# Patient Record
Sex: Male | Born: 1945 | Race: White | Hispanic: No | Marital: Married | Smoking: Former smoker
Health system: Southern US, Community
[De-identification: ages and names within clinical notes are randomized; demographics above are authoritative.]

## PROBLEM LIST (undated history)

## (undated) DIAGNOSIS — E785 Hyperlipidemia, unspecified: Secondary | ICD-10-CM

## (undated) DIAGNOSIS — E119 Type 2 diabetes mellitus without complications: Secondary | ICD-10-CM

## (undated) DIAGNOSIS — M4802 Spinal stenosis, cervical region: Secondary | ICD-10-CM

## (undated) DIAGNOSIS — Z973 Presence of spectacles and contact lenses: Secondary | ICD-10-CM

## (undated) DIAGNOSIS — K219 Gastro-esophageal reflux disease without esophagitis: Secondary | ICD-10-CM

## (undated) DIAGNOSIS — I10 Essential (primary) hypertension: Secondary | ICD-10-CM

## (undated) DIAGNOSIS — I739 Peripheral vascular disease, unspecified: Secondary | ICD-10-CM

## (undated) DIAGNOSIS — C801 Malignant (primary) neoplasm, unspecified: Secondary | ICD-10-CM

## (undated) DIAGNOSIS — H409 Unspecified glaucoma: Secondary | ICD-10-CM

## (undated) HISTORY — DX: Essential (primary) hypertension: I10

## (undated) HISTORY — DX: Unspecified glaucoma: H40.9

## (undated) HISTORY — PX: HERNIA REPAIR: SHX51

## (undated) HISTORY — DX: Gastro-esophageal reflux disease without esophagitis: K21.9

## (undated) HISTORY — DX: Hyperlipidemia, unspecified: E78.5

## (undated) HISTORY — DX: Peripheral vascular disease, unspecified: I73.9

## (undated) HISTORY — DX: Type 2 diabetes mellitus without complications: E11.9

## (undated) HISTORY — PX: OTHER SURGICAL HISTORY: SHX169

---

## 2000-06-03 ENCOUNTER — Emergency Department (HOSPITAL_COMMUNITY): Admission: EM | Admit: 2000-06-03 | Discharge: 2000-06-03 | Payer: Self-pay | Admitting: *Deleted

## 2000-06-03 ENCOUNTER — Encounter: Payer: Self-pay | Admitting: *Deleted

## 2009-11-04 ENCOUNTER — Ambulatory Visit (HOSPITAL_COMMUNITY): Admission: RE | Admit: 2009-11-04 | Discharge: 2009-11-04 | Payer: Self-pay | Admitting: General Surgery

## 2010-04-02 LAB — CBC
HCT: 46 % (ref 39.0–52.0)
Hemoglobin: 15.8 g/dL (ref 13.0–17.0)
MCH: 30.4 pg (ref 26.0–34.0)
MCHC: 34.4 g/dL (ref 30.0–36.0)
MCV: 88.3 fL (ref 78.0–100.0)
Platelets: 196 10*3/uL (ref 150–400)
RBC: 5.21 MIL/uL (ref 4.22–5.81)
RDW: 12.8 % (ref 11.5–15.5)
WBC: 6.7 10*3/uL (ref 4.0–10.5)

## 2010-04-02 LAB — BASIC METABOLIC PANEL
BUN: 17 mg/dL (ref 6–23)
CO2: 28 mEq/L (ref 19–32)
Calcium: 9.4 mg/dL (ref 8.4–10.5)
Chloride: 105 mEq/L (ref 96–112)
Creatinine, Ser: 0.94 mg/dL (ref 0.4–1.5)
GFR calc Af Amer: >60 mL/min (ref >60)
GFR calc non Af Amer: >60 mL/min (ref >60)
Glucose, Bld: 113 mg/dL — ABNORMAL HIGH (ref 70–99)
Potassium: 4.2 mEq/L (ref 3.5–5.1)
Sodium: 139 mEq/L (ref 135–145)

## 2010-04-02 LAB — SURGICAL PCR SCREEN
MRSA, PCR: NEGATIVE
Staphylococcus aureus: NEGATIVE

## 2010-11-19 ENCOUNTER — Ambulatory Visit (HOSPITAL_COMMUNITY): Payer: Self-pay

## 2010-11-19 ENCOUNTER — Other Ambulatory Visit: Payer: Self-pay | Admitting: Family Medicine

## 2010-11-19 DIAGNOSIS — J40 Bronchitis, not specified as acute or chronic: Secondary | ICD-10-CM

## 2010-11-25 ENCOUNTER — Ambulatory Visit (HOSPITAL_COMMUNITY)
Admission: RE | Admit: 2010-11-25 | Discharge: 2010-11-25 | Disposition: A | Discharge status: Home / Self Care | Payer: Medicare Other | Source: Ambulatory Visit | Attending: Family Medicine | Admitting: Family Medicine

## 2010-11-25 DIAGNOSIS — R059 Cough, unspecified: Secondary | ICD-10-CM | POA: Insufficient documentation

## 2010-11-25 DIAGNOSIS — R05 Cough: Secondary | ICD-10-CM | POA: Insufficient documentation

## 2010-11-25 DIAGNOSIS — J40 Bronchitis, not specified as acute or chronic: Secondary | ICD-10-CM | POA: Insufficient documentation

## 2011-09-15 DIAGNOSIS — H43819 Vitreous degeneration, unspecified eye: Secondary | ICD-10-CM | POA: Diagnosis not present

## 2011-09-15 DIAGNOSIS — H2510 Age-related nuclear cataract, unspecified eye: Secondary | ICD-10-CM | POA: Diagnosis not present

## 2012-01-29 DIAGNOSIS — J019 Acute sinusitis, unspecified: Secondary | ICD-10-CM | POA: Diagnosis not present

## 2012-02-24 ENCOUNTER — Emergency Department (HOSPITAL_COMMUNITY): Payer: Medicare Other

## 2012-02-24 ENCOUNTER — Encounter (HOSPITAL_COMMUNITY): Payer: Self-pay

## 2012-02-24 ENCOUNTER — Emergency Department (HOSPITAL_COMMUNITY)
Admission: EM | Admit: 2012-02-24 | Discharge: 2012-02-24 | Disposition: A | Discharge status: Home / Self Care | Payer: Medicare Other | Attending: Emergency Medicine | Admitting: Emergency Medicine

## 2012-02-24 DIAGNOSIS — S92919A Unspecified fracture of unspecified toe(s), initial encounter for closed fracture: Secondary | ICD-10-CM | POA: Diagnosis not present

## 2012-02-24 DIAGNOSIS — X500XXA Overexertion from strenuous movement or load, initial encounter: Secondary | ICD-10-CM | POA: Insufficient documentation

## 2012-02-24 DIAGNOSIS — Y939 Activity, unspecified: Secondary | ICD-10-CM | POA: Insufficient documentation

## 2012-02-24 DIAGNOSIS — F17200 Nicotine dependence, unspecified, uncomplicated: Secondary | ICD-10-CM | POA: Insufficient documentation

## 2012-02-24 DIAGNOSIS — Y92009 Unspecified place in unspecified non-institutional (private) residence as the place of occurrence of the external cause: Secondary | ICD-10-CM | POA: Insufficient documentation

## 2012-02-24 DIAGNOSIS — W010XXA Fall on same level from slipping, tripping and stumbling without subsequent striking against object, initial encounter: Secondary | ICD-10-CM | POA: Insufficient documentation

## 2012-02-24 DIAGNOSIS — IMO0002 Reserved for concepts with insufficient information to code with codable children: Secondary | ICD-10-CM | POA: Diagnosis not present

## 2012-02-24 DIAGNOSIS — S93106A Unspecified dislocation of unspecified toe(s), initial encounter: Secondary | ICD-10-CM

## 2012-02-24 DIAGNOSIS — S92403A Displaced unspecified fracture of unspecified great toe, initial encounter for closed fracture: Secondary | ICD-10-CM

## 2012-02-24 DIAGNOSIS — S92911A Unspecified fracture of right toe(s), initial encounter for closed fracture: Secondary | ICD-10-CM

## 2012-02-24 IMAGING — CR DG TOE GREAT 2+V*R*
3 series · 3 of 3 positions shown · non-contrast
Comparison: [DATE] at [DATE] hours

CLINICAL DATA: External reduction.  Great toe fracture.

RIGHT GREAT TOE

[view not recorded (1 of 3)]
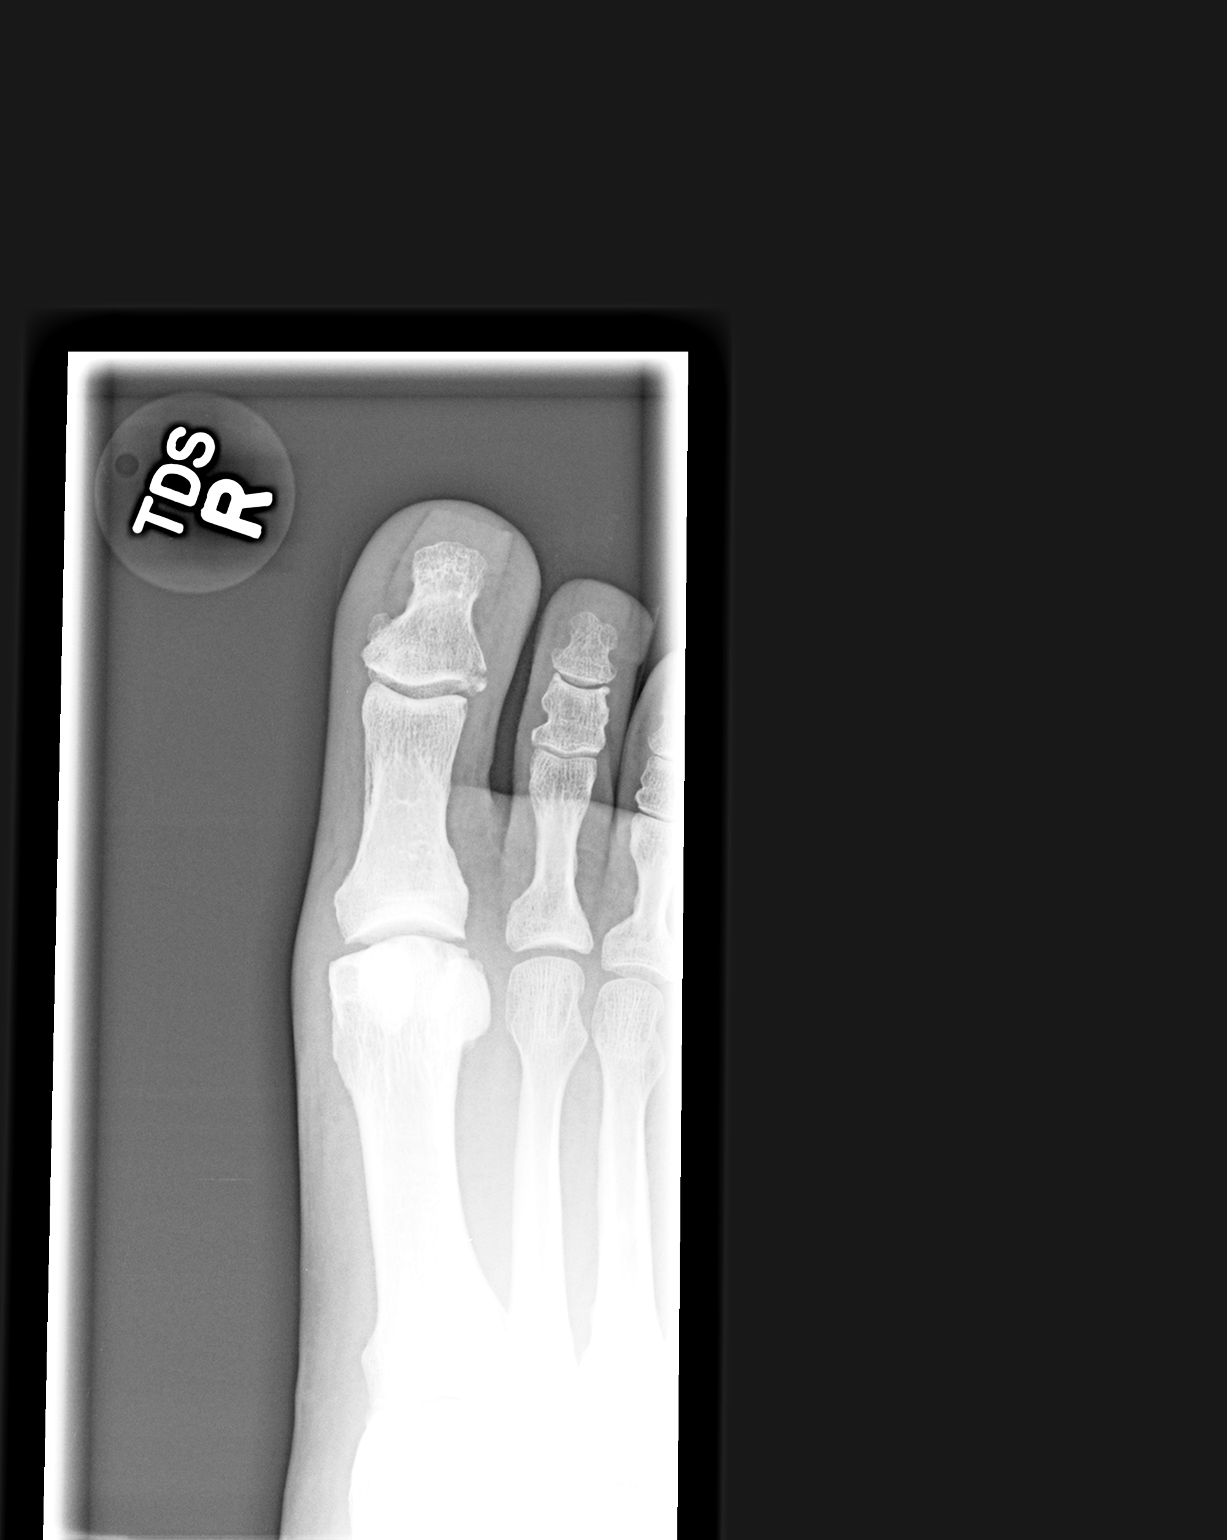

[view not recorded (2 of 3)]
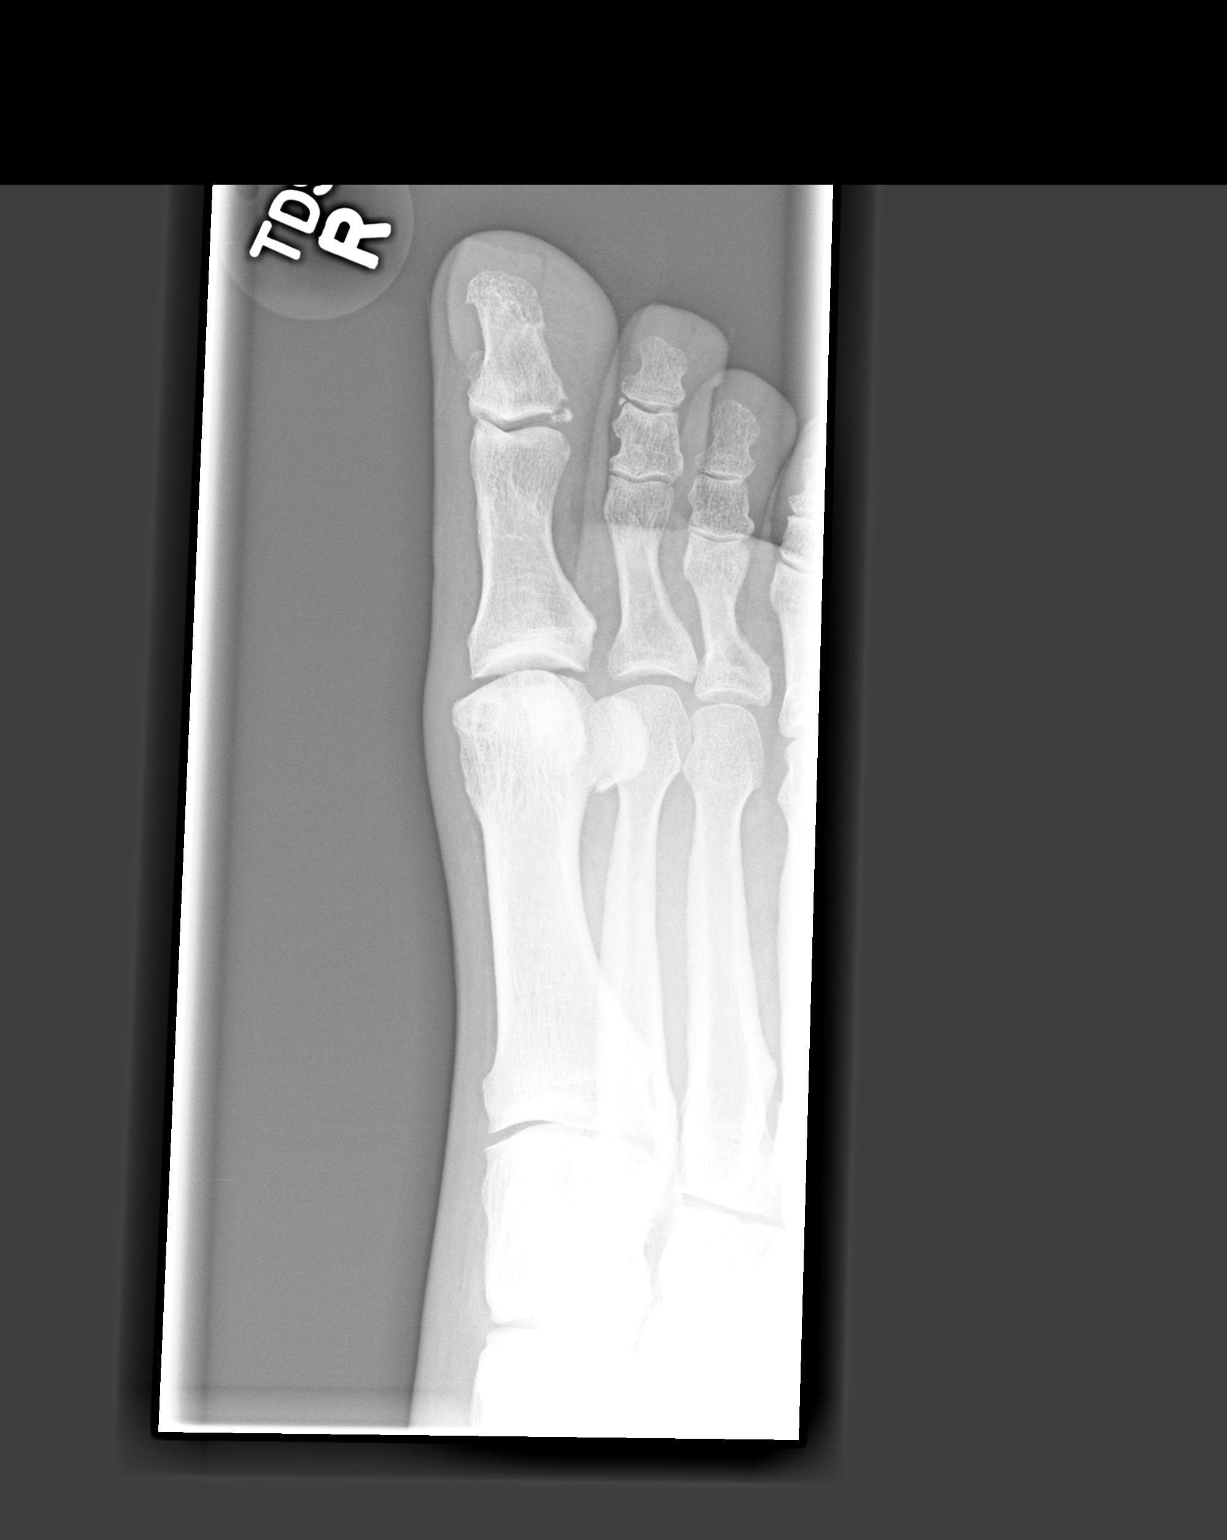

[view not recorded (3 of 3)]
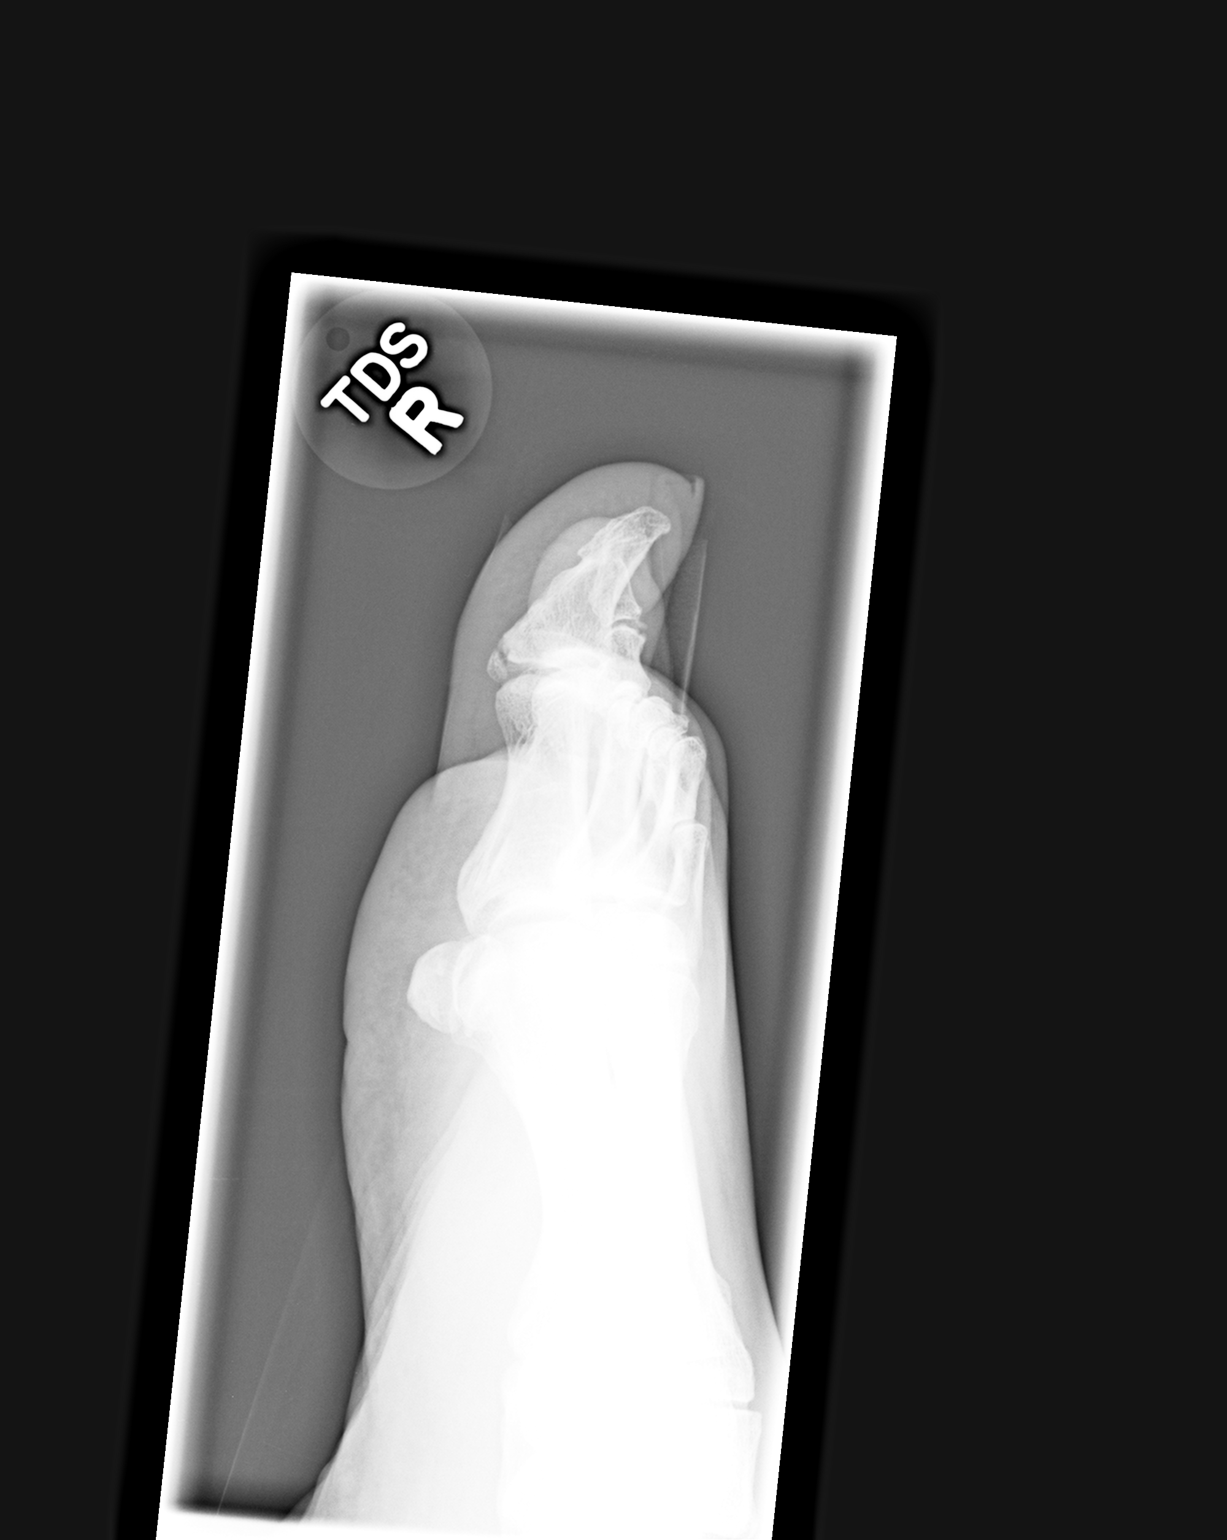

[3 of 3 positions shown; findings below may reference images not displayed]

FINDINGS: Prior dislocation is been successfully reduced.  Lateral
fracture of the distal phalangeal base is again observed, now with
reduced displacement of bowel 1 mm.
IMPRESSION: 1.  Successful reduction of distal interphalangeal joint.  Lateral
basilar fracture of the distal phalanx noted.

## 2012-02-24 MED ORDER — LIDOCAINE HCL (PF) 1 % IJ SOLN
5.0000 mL | Freq: Once | INTRAMUSCULAR | Status: AC
  Administered 2012-02-24: 5 mL
  Filled 2012-02-24: qty 5

## 2012-02-24 MED ORDER — IBUPROFEN 800 MG PO TABS
800.0000 mg | ORAL_TABLET | Freq: Once | ORAL | Status: AC
  Administered 2012-02-24: 800 mg via ORAL
  Filled 2012-02-24: qty 1

## 2012-02-24 MED ORDER — HYDROCODONE-ACETAMINOPHEN 5-325 MG PO TABS: ORAL_TABLET | ORAL | Status: DC

## 2012-02-24 NOTE — ED Provider Notes (Signed)
Pt has a dislocation of the IP joint of the right great toe, PA Triplett was having difficulty getting it relocated. I assisted and we did extension with lateral pressure and a pop was felt and the toe now is no longer deformed and has good ROM at the IP joint.   Medical screening examination/treatment/procedure(s) were conducted as a shared visit with non-physician practitioner(s) and myself.  I personally evaluated the patient during the encounter  Devoria Albe, MD, Franz Dell, MD 02/24/12 1344

## 2012-02-24 NOTE — ED Notes (Signed)
Pt did not want post op shoe

## 2012-02-24 NOTE — ED Notes (Signed)
Wound cleaned with sterile water,telfa applied and gauze wrap. Pt tolerated well.

## 2012-02-24 NOTE — ED Provider Notes (Addendum)
History     CSN: [id number]  Arrival date & time 02/24/12  1026   First MD Initiated Contact with Patient 02/24/12 1104      Chief Complaint  Patient presents with  . Fall    (Consider location/radiation/quality/duration/timing/severity/associated sxs/prior treatment) HPI Comments: Patient c/o throbbing pain to the right great toe that began after he slipped and fell outside on the evening prior to ED arrival.  States that he fell on his left knee and his right foot was twisted underneath his leg.  C/o abrasion to the left knee and increasing pain to the toe.  Noticed swelling and bruising to the toe.  Able to bear weight to the heel of his foot only.  He denies head injury, neck pain, back pain or other injuries.  He also denies having any symptoms prior to the fall.    Patient is a 67 y.o. male presenting with foot injury. The history is provided by the patient and the spouse.  Foot Injury  Incident onset: greater than 12 hrs PTA. The incident occurred at home. The injury mechanism was a fall. The pain is present in the right toes. The quality of the pain is described as aching and throbbing. The pain is moderate. The pain has been constant since onset. Pertinent negatives include no numbness, no inability to bear weight, no loss of motion, no muscle weakness, no loss of sensation and no tingling. He reports no foreign bodies present. The symptoms are aggravated by activity, bearing weight and palpation. He has tried elevation for the symptoms. The treatment provided no relief.    History reviewed. No pertinent past medical history.  Past Surgical History  Procedure Date  . Hernia repair     No family history on file.  History  Substance Use Topics  . Smoking status: Current Every Day Smoker    Types: Cigarettes  . Smokeless tobacco: Not on file  . Alcohol Use: Yes     Comment: occ      Review of Systems  Constitutional: Negative for fever and chills.  HENT: Negative for  neck pain.   Eyes: Negative for visual disturbance.  Genitourinary: Negative for dysuria and difficulty urinating.  Musculoskeletal: Positive for joint swelling and arthralgias. Negative for back pain.  Skin: Negative for color change and wound.  Neurological: Negative for dizziness, tingling and numbness.  All other systems reviewed and are negative.    Allergies  Review of patient's allergies indicates no known allergies.  Home Medications   Current Outpatient Rx  Name  Route  Sig  Dispense  Refill  . ASPIRIN 325 MG PO TABS   Oral   Take 325 mg by mouth daily as needed. For pain           BP 151/72  Pulse 70  Temp 97.9 F (36.6 C) (Oral)  Resp 22  Ht 5\' 11"  (1.803 m)  Wt 195 lb (88.451 kg)  BMI 27.20 kg/m2  SpO2 98%  Physical Exam  Nursing note and vitals reviewed. Constitutional: He is oriented to person, place, and time. He appears well-developed and well-nourished. No distress.  HENT:  Head: Normocephalic and atraumatic.  Neck: Normal range of motion, full passive range of motion without pain and phonation normal. Neck supple. No spinous process tenderness and no muscular tenderness present.  Cardiovascular: Normal rate, regular rhythm, normal heart sounds and intact distal pulses.   No murmur heard. Pulmonary/Chest: Effort normal and breath sounds normal. No respiratory distress.  Musculoskeletal: He  exhibits edema and tenderness.       Right foot: He exhibits tenderness, bony tenderness, swelling and deformity. He exhibits normal range of motion, normal capillary refill, no crepitus and no laceration.       Feet:       ttp of the distal right great toe with a medial deformity of the distal toe.  Nail is intact w/o injury.  DP pulse is brisk, CR< 2 sec, distal sensation intact  Lymphadenopathy:    He has no cervical adenopathy.  Neurological: He is alert and oriented to person, place, and time. He exhibits normal muscle tone. Coordination normal.  Skin: Skin  is warm and dry.    ED Course  Reduction of dislocation Performed by: Jacquelyn Shadrick L. Authorized by: Maxwell Caul Consent: Verbal consent obtained. Written consent not obtained. Risks and benefits: risks, benefits and alternatives were discussed Consent given by: patient Patient understanding: patient states understanding of the procedure being performed Patient consent: the patient's understanding of the procedure matches consent given Imaging studies: imaging studies available Patient identity confirmed: verbally with patient and arm band Time out: Immediately prior to procedure a "time out" was called to verify the correct patient, procedure, equipment, support staff and site/side marked as required. Preparation: Patient was prepped and draped in the usual sterile fashion. Local anesthesia used: yes Anesthesia: digital block Local anesthetic: lidocaine 1% without epinephrine Anesthetic total: 4 ml Patient sedated: no Patient tolerance: Patient tolerated the procedure well with no immediate complications.   (including critical care time)  Labs Reviewed - No data to display Dg Foot Complete Right  02/24/2012  *RADIOLOGY REPORT*  Clinical Data: Right foot pain after a fall.  RIGHT FOOT COMPLETE - 3+ VIEW  Comparison: None.  Findings: There is an acute oblique fracture of the lateral base of the distal phalanx of the great toe extending into the interphalangeal joint articular surface.  Distal phalanx appears medially displaced 8 mm with respect to the proximal phalanx of the great toe.  No malalignment at the Lisfranc joint observed.  No metatarsal fracture noted.  A degenerative subcortical cyst or erosion is noted medially along the first metatarsal head.  IMPRESSION:  1.  Fracture the lateral base of the distal phalanx of the great toe, with medial dislocation of the distal phalanx with respect the proximal.   Original Report Authenticated By: Gaylyn Rong, M.D.    Dg Toe  Great Right  02/24/2012  *RADIOLOGY REPORT*  Clinical Data: External reduction.  Great toe fracture.  RIGHT GREAT TOE  Comparison: 02/24/2012 at 13:02 hours  Findings: Prior dislocation is been successfully reduced.  Lateral fracture of the distal phalangeal base is again observed, now with reduced displacement of bowel 1 mm.  IMPRESSION:  1.  Successful reduction of distal interphalangeal joint.  Lateral basilar fracture of the distal phalanx noted.   Original Report Authenticated By: Gaylyn Rong, M.D.    Dg Toe Great Right  02/24/2012  *RADIOLOGY REPORT*  Clinical Data: Status post reduction.  Distal phalanx fracture.  RIGHT GREAT TOE  Comparison: Three views the right foot 02/24/2012.  Findings: A fracture at the lateral aspect of the base of the distal phalanx is similar to the prior study.  The distal phalanx remains subluxed medially.  No new fractures are present.  IMPRESSION:  1.  Persistence medial subluxation of the distal phalanx in the great toe. 2.  Stable appearance of the fracture at the lateral base of the distal phalanx.   Original  Report Authenticated By: Marin Roberts, M.D.      Diagnosis:  Dislocation and fracture of the distal right interphalangeal joint of the great toe   MDM    Reduction of the dislocated phalynx attempted by myself.  Unsuccessful by x-ray    1340  Successful second attempt at reduction of the dislocation by myself and Dr. Devoria Albe.  Pain improved.  Distal sensation remains intact  Toe was buddy taped by me and post-op boot applied by nursing.  Pt agrees to elevate, ice and f/u with orthopedics.     Prescribed: norco #20  Jhade Berko L. Brantley Wiley, PA 02/25/12 2229  Oscar Forman L. [place], [place] 03/10/12 [phone]

## 2012-02-24 NOTE — ED Notes (Signed)
Pt reports that he lost balance and fell last night, injured right knee and foot. Woke today w/ cont. pain

## 2012-02-26 NOTE — ED Provider Notes (Signed)
Medical screening examination/treatment/procedure(s) were performed by non-physician practitioner and as supervising physician I was immediately available for consultation/collaboration. Aadvika Konen, MD, FACEP   Taleia Sadowski L Matteson Blue, MD 02/26/12 0030 

## 2012-03-10 NOTE — ED Provider Notes (Signed)
Medical screening examination/treatment/procedure(s) were performed by non-physician practitioner and as supervising physician I was immediately available for consultation/collaboration. Alvar Malinoski, MD, FACEP   Shaianne Nucci L Tywana Robotham, MD 03/10/12 1454 

## 2012-03-16 DIAGNOSIS — R21 Rash and other nonspecific skin eruption: Secondary | ICD-10-CM | POA: Diagnosis not present

## 2012-04-08 ENCOUNTER — Encounter: Payer: Self-pay | Admitting: Family Medicine

## 2012-04-08 ENCOUNTER — Ambulatory Visit (INDEPENDENT_AMBULATORY_CARE_PROVIDER_SITE_OTHER): Payer: Medicare Other | Admitting: Family Medicine

## 2012-04-08 VITALS — BP 110/58 | HR 60 | Ht 71.0 in | Wt 194.0 lb

## 2012-04-08 DIAGNOSIS — L570 Actinic keratosis: Secondary | ICD-10-CM

## 2012-04-08 DIAGNOSIS — K219 Gastro-esophageal reflux disease without esophagitis: Secondary | ICD-10-CM

## 2012-04-08 NOTE — Progress Notes (Signed)
  Subjective:    Patient ID: Grant [name], male    DOB: July 18, 1945, 67 y.o.   MRN: [id number]  Rash This is a chronic problem. The current episode started more than 1 year ago. The problem has been gradually worsening since onset. The affected locations include the left arm. The rash is characterized by itchiness and scaling. He was exposed to nothing.      Review of Systems  Skin: Positive for rash.       Objective:   Physical Exam  Constitutional: He appears well-developed and well-nourished.  HENT:  Head: Normocephalic and atraumatic.  Eyes: EOM are normal. Pupils are equal, round, and reactive to light.  Neck: Normal range of motion. Neck supple.  Cardiovascular: Normal rate and regular rhythm.   Pulmonary/Chest: Effort normal and breath sounds normal.  Skin:  Probable actinic keratosis left elbow. Similar finding left temple.          Assessment & Plan:  Impression #1 actinic keratosis discussed. Plan patient to followup with dermatologist. Patient Active Problem List  Diagnosis  . Esophageal reflux

## 2012-04-08 NOTE — Patient Instructions (Signed)
See dr. Margo Aye for this rash.

## 2012-04-10 DIAGNOSIS — K219 Gastro-esophageal reflux disease without esophagitis: Secondary | ICD-10-CM | POA: Insufficient documentation

## 2012-04-10 NOTE — Assessment & Plan Note (Signed)
Impression reflux clinically stable. Maintain same over-the-counter approach.

## 2012-04-18 DIAGNOSIS — D0460 Carcinoma in situ of skin of unspecified upper limb, including shoulder: Secondary | ICD-10-CM | POA: Diagnosis not present

## 2012-04-18 DIAGNOSIS — L570 Actinic keratosis: Secondary | ICD-10-CM | POA: Diagnosis not present

## 2012-04-18 DIAGNOSIS — D235 Other benign neoplasm of skin of trunk: Secondary | ICD-10-CM | POA: Diagnosis not present

## 2013-08-09 ENCOUNTER — Encounter: Payer: Self-pay | Admitting: Family Medicine

## 2013-08-09 ENCOUNTER — Ambulatory Visit (INDEPENDENT_AMBULATORY_CARE_PROVIDER_SITE_OTHER): Payer: Medicare Other | Admitting: Family Medicine

## 2013-08-09 VITALS — BP 114/78 | Temp 98.0°F | Ht 71.0 in | Wt 194.2 lb

## 2013-08-09 DIAGNOSIS — M25579 Pain in unspecified ankle and joints of unspecified foot: Secondary | ICD-10-CM | POA: Diagnosis not present

## 2013-08-09 DIAGNOSIS — M25571 Pain in right ankle and joints of right foot: Secondary | ICD-10-CM

## 2013-08-09 MED ORDER — NABUMETONE 750 MG PO TABS: 750.0000 mg | ORAL_TABLET | Freq: Every day | ORAL | Status: DC

## 2013-08-09 NOTE — Progress Notes (Signed)
   Subjective:    Patient ID: Grant [name], male    DOB: 02-19-1945, 68 y.o.   MRN: [id number]  Leg Pain  The incident occurred 2 days ago. The incident occurred at home. There was no injury mechanism. The pain is present in the right leg. The quality of the pain is described as stabbing and shooting. The pain is at a severity of 9/10. The pain is moderate. The pain has been constant since onset. He reports no foreign bodies present. The symptoms are aggravated by weight bearing and movement. He has tried NSAIDs and ice for the symptoms. The treatment provided mild relief.   Patient states that he has no other concerns at this time.   Very painful the whole leg, has a tendency to " break away"  Gets painful every now and then, pain severe at times  Patient states most of the pain currently however is in his right foot. On further history had a great toe fracture and dislocation a couple years ago. Since then ongoing pain. In recent weeks right mid foot and ankle have been more painful. Swelling at times. Stays active and exercises fair amnt    Review of Systems No chest pain no headache no back pain no abdominal pain ROS otherwise negative    Objective:   Physical Exam Alert no apparent distress vitals stable H&T normal. Lungs clear heart rare rhythm. Right foot pulses good pain at the base large toe pain at right lateral ankle moderate pain with flexion of ankle moderate pain with pressure on the dorsum of foot.       Assessment & Plan:  Impression musculoskeletal pain of ankle right foot status post old great toe injury plan anti-inflammatory medicine prescribed local measures discussed. Symptomatic care discussed. WSL

## 2013-08-10 ENCOUNTER — Telehealth: Payer: Self-pay | Admitting: Family Medicine

## 2013-08-10 NOTE — Telephone Encounter (Signed)
Patient calling this morning wanting pain meds  Was seen yesterday with hip,knee pain. Wife states he really needs something for pain today.

## 2013-08-11 ENCOUNTER — Ambulatory Visit (INDEPENDENT_AMBULATORY_CARE_PROVIDER_SITE_OTHER): Payer: Medicare Other | Admitting: Family Medicine

## 2013-08-11 ENCOUNTER — Encounter: Payer: Self-pay | Admitting: Family Medicine

## 2013-08-11 ENCOUNTER — Ambulatory Visit (HOSPITAL_COMMUNITY)
Admission: RE | Admit: 2013-08-11 | Discharge: 2013-08-11 | Disposition: A | Discharge status: Home / Self Care | Payer: Medicare Other | Source: Ambulatory Visit | Attending: Family Medicine | Admitting: Family Medicine

## 2013-08-11 ENCOUNTER — Other Ambulatory Visit: Payer: Self-pay | Admitting: *Deleted

## 2013-08-11 VITALS — Ht 71.0 in | Wt 196.0 lb

## 2013-08-11 DIAGNOSIS — M25551 Pain in right hip: Secondary | ICD-10-CM

## 2013-08-11 DIAGNOSIS — M5431 Sciatica, right side: Secondary | ICD-10-CM

## 2013-08-11 DIAGNOSIS — M79604 Pain in right leg: Secondary | ICD-10-CM

## 2013-08-11 DIAGNOSIS — M5137 Other intervertebral disc degeneration, lumbosacral region: Secondary | ICD-10-CM | POA: Diagnosis not present

## 2013-08-11 DIAGNOSIS — M545 Low back pain, unspecified: Secondary | ICD-10-CM | POA: Diagnosis present

## 2013-08-11 DIAGNOSIS — M25579 Pain in unspecified ankle and joints of unspecified foot: Secondary | ICD-10-CM

## 2013-08-11 DIAGNOSIS — M51379 Other intervertebral disc degeneration, lumbosacral region without mention of lumbar back pain or lower extremity pain: Secondary | ICD-10-CM | POA: Insufficient documentation

## 2013-08-11 DIAGNOSIS — M5430 Sciatica, unspecified side: Secondary | ICD-10-CM | POA: Diagnosis not present

## 2013-08-11 DIAGNOSIS — M25571 Pain in right ankle and joints of right foot: Secondary | ICD-10-CM

## 2013-08-11 DIAGNOSIS — I700 Atherosclerosis of aorta: Secondary | ICD-10-CM | POA: Diagnosis not present

## 2013-08-11 DIAGNOSIS — M79609 Pain in unspecified limb: Secondary | ICD-10-CM | POA: Insufficient documentation

## 2013-08-11 MED ORDER — PREDNISONE 20 MG PO TABS: ORAL_TABLET | ORAL | Status: DC

## 2013-08-11 MED ORDER — HYDROCODONE-ACETAMINOPHEN 5-325 MG PO TABS: ORAL_TABLET | ORAL | Status: DC

## 2013-08-11 MED ORDER — NABUMETONE 750 MG PO TABS: 750.0000 mg | ORAL_TABLET | Freq: Two times a day (BID) | ORAL | Status: DC

## 2013-08-11 NOTE — Telephone Encounter (Signed)
STAT xrays ordered per Dr. Richardson Landry. Pt coming in today.

## 2013-08-11 NOTE — Telephone Encounter (Signed)
This is the kind of message that should have been passed thru to the only doc yesterday. We did start pt on anti inflam med. Did he get his xray??? (not in system) hydrocod 5/325 one q 4 to 6 prn, drowsy prec numb thirty re ck next wk if persists

## 2013-08-11 NOTE — Progress Notes (Signed)
   Subjective:    Patient ID: Grant [name], male    DOB: [birth year], 68 y.o.   MRN: [id number]  HPI Patient has radiating foot pain now going up thru leg.  Terrible pain, pain seemed to be in the low back and in the foot  Leg gave away and caused pin,  Started antiu inflam med  occas sacroiliac pain usually can tell when it comes on. Patient gives a long-standing history of intermittent sciatica. Recent weeks appears to be for now. Severe low back pain. Severe pain deep in the right buttock. Radiating on down the.   Also ongoing foot and ankle pain. Please see prior note. This appears to be related to prior telemetry. Patient notes that he walks somewhat differently when the toe pain flares up.  Anti-inflammatory medication related as a seemed to be helping all that much.   Review of Systems No chest pain no change in urinary bowel habits no headache no loss of consciousness no rash ROS otherwise negative    Objective:   Physical Exam  Alert no apparent distress H&T normal. Lungs clear. Heart rare rhythm. Right deep buttock tender to palpation. Positive straight leg raise on the right. Distinct tenderness right sciatic notch Pulses good. Knee good range of motion. Right lateral ankle and dorsal foot tears to deep palpation. Great toe good range of motion.  Multiple x-rays reviewed.      Assessment & Plan:  Impression 1 sciatica discussed at length. #2 foot ankle pain likely musculoskeletal secondary to adjusting gait discussed plan prednisone taper. Hydrocodone when necessary. If symptoms did not considerably improve will need a low back x-ray.

## 2013-08-11 NOTE — Addendum Note (Signed)
Addended byCharolotte Capuchin D on: 08/11/2013 08:21 AM   Modules accepted: Orders

## 2013-08-22 ENCOUNTER — Ambulatory Visit (INDEPENDENT_AMBULATORY_CARE_PROVIDER_SITE_OTHER): Payer: Medicare Other | Admitting: Family Medicine

## 2013-08-22 ENCOUNTER — Encounter: Payer: Self-pay | Admitting: Family Medicine

## 2013-08-22 ENCOUNTER — Telehealth: Payer: Self-pay | Admitting: Family Medicine

## 2013-08-22 VITALS — BP 132/90 | Ht 71.0 in | Wt 190.0 lb

## 2013-08-22 DIAGNOSIS — G589 Mononeuropathy, unspecified: Secondary | ICD-10-CM

## 2013-08-22 DIAGNOSIS — R5383 Other fatigue: Secondary | ICD-10-CM

## 2013-08-22 DIAGNOSIS — Z0189 Encounter for other specified special examinations: Secondary | ICD-10-CM | POA: Diagnosis not present

## 2013-08-22 DIAGNOSIS — R5381 Other malaise: Secondary | ICD-10-CM

## 2013-08-22 DIAGNOSIS — M25571 Pain in right ankle and joints of right foot: Secondary | ICD-10-CM

## 2013-08-22 DIAGNOSIS — M25579 Pain in unspecified ankle and joints of unspecified foot: Secondary | ICD-10-CM

## 2013-08-22 DIAGNOSIS — G629 Polyneuropathy, unspecified: Secondary | ICD-10-CM

## 2013-08-22 MED ORDER — OXYCODONE-ACETAMINOPHEN 5-325 MG PO TABS: ORAL_TABLET | ORAL | Status: DC

## 2013-08-22 NOTE — Telephone Encounter (Signed)
Office visit scheduled for today with Dr. Richardson Landry.

## 2013-08-22 NOTE — Telephone Encounter (Signed)
pts wife calling to say that his pain is some times worse than when he was in here, He can't lie down, sitting is painful. He in agony at this point. The medicine seems to  Be having ill effects on him. Can't concentrate, hard to focus. They want to bring him in Today instead of waiting for his follow up appt already scheduled for this Thursday 8/6   Please advise

## 2013-08-22 NOTE — Progress Notes (Signed)
   Subjective:    Patient ID: Grant [name], male    DOB: 11-Nov-1945, 68 y.o.   MRN: [id number]  HPIFollow up on sciatica pain on right side. Finished prednisone. Taking hydrocodone and reflafen. Pain is worse. Not able to sleep well at night.   On further history the patient reports his sciatica pain has improved considerably.  Still having bad pain focused primarily on the ankle. Worse when he gets up around. Often significant pain into the evening.  Review of Systems No chest pain no nausea no abdominal pain no change in bowel habits ROS otherwise negative    Objective:   Physical Exam  Alert mild malaise. Lungs clear heart rare rhythm H&T normal. Ankles right ankle pain to lateral palpation positive pain with flexion.  Of note x-ray was normal      Assessment & Plan:  Impression persistent ankle pain severe according to patient intermittently. Plan or so referral. Pain medicines written. At end of visit patient brought up the numbness and tingling he gets in both hands and the feet. We'll do her pre-blood work. Followup for a neuropathy evaluation. WSL

## 2013-08-23 ENCOUNTER — Other Ambulatory Visit: Payer: Self-pay | Admitting: *Deleted

## 2013-08-24 ENCOUNTER — Ambulatory Visit: Payer: Medicare Other | Admitting: Family Medicine

## 2013-08-30 ENCOUNTER — Ambulatory Visit (INDEPENDENT_AMBULATORY_CARE_PROVIDER_SITE_OTHER): Payer: Medicare Other | Admitting: Family Medicine

## 2013-08-30 ENCOUNTER — Encounter: Payer: Self-pay | Admitting: Family Medicine

## 2013-08-30 VITALS — BP 138/84 | Ht 71.0 in | Wt 190.0 lb

## 2013-08-30 DIAGNOSIS — M25579 Pain in unspecified ankle and joints of unspecified foot: Secondary | ICD-10-CM | POA: Diagnosis not present

## 2013-08-30 DIAGNOSIS — M25571 Pain in right ankle and joints of right foot: Secondary | ICD-10-CM

## 2013-08-30 MED ORDER — OXYCODONE-ACETAMINOPHEN 7.5-325 MG PO TABS: ORAL_TABLET | ORAL | Status: DC

## 2013-08-30 NOTE — Progress Notes (Signed)
   Subjective:    Patient ID: Grant [name], male    DOB: Aug 11, 1945, 68 y.o.   MRN: [id number]  Ankle Pain  The incident occurred more than 1 week ago. The incident occurred at home. There was no injury mechanism. The pain is present in the right ankle. The pain is moderate. The pain has been constant since onset. He reports no foreign bodies present. The symptoms are aggravated by weight bearing, movement and palpation. He has tried immobilization and rest (oxycodone) for the symptoms. The treatment provided no relief.   Patient returns for once again a very lengthy analysis of his leg pain. Of note did have sciatica this appears to be better. Now pain has settled primarily into right ankle. Accompanied by a very substantial swelling. Extremely painful with movement.  Taking the oxycodone very frequently.  No fam hx of gout  Prior hx of inj hyperextended his ankle stepping on something. May have experienced a trauma a few weeks ago cannot remember.  No major pain elsewhere. No headache     Review of Systems No headache no chest pain no back pain no abdominal pain no change in bowel habits no blood in stool ROS otherwise negative    Objective:   Physical Exam Alert some distress vital signs stable. Lungs clear. Heart rare in rhythm. Right ankle and foot noticeably swollen. Substantial pain with movement of right ankle. Knee good. Negative to straight leg raise.       Assessment & Plan:  Impression progressive pain now worsening call in dorsal upper mid foot. Differential includes gout s and/or posttraumatic such a stress fracture. Pain control currently suboptimum plan increase oxycodone rationale discussed. Use discussed. Bone scan. Uric acid. Still awaiting orthopedic referral. WSL

## 2013-08-31 ENCOUNTER — Ambulatory Visit: Payer: Medicare Other | Admitting: Orthopedic Surgery

## 2013-09-04 ENCOUNTER — Encounter (HOSPITAL_COMMUNITY): Payer: Self-pay

## 2013-09-04 ENCOUNTER — Encounter (HOSPITAL_COMMUNITY)
Admission: RE | Admit: 2013-09-04 | Discharge: 2013-09-04 | Disposition: A | Discharge status: Home / Self Care | Payer: Medicare Other | Source: Ambulatory Visit | Attending: Family Medicine | Admitting: Family Medicine

## 2013-09-04 DIAGNOSIS — R937 Abnormal findings on diagnostic imaging of other parts of musculoskeletal system: Secondary | ICD-10-CM | POA: Insufficient documentation

## 2013-09-04 DIAGNOSIS — M25571 Pain in right ankle and joints of right foot: Secondary | ICD-10-CM

## 2013-09-04 DIAGNOSIS — M79609 Pain in unspecified limb: Secondary | ICD-10-CM | POA: Diagnosis not present

## 2013-09-04 MED ORDER — TECHNETIUM TC 99M MEDRONATE IV KIT
25.0000 | PACK | Freq: Once | INTRAVENOUS | Status: AC | PRN
  Administered 2013-09-04: 25 via INTRAVENOUS

## 2013-09-05 DIAGNOSIS — Z0189 Encounter for other specified special examinations: Secondary | ICD-10-CM | POA: Diagnosis not present

## 2013-09-05 DIAGNOSIS — R5383 Other fatigue: Secondary | ICD-10-CM | POA: Diagnosis not present

## 2013-09-05 DIAGNOSIS — R5381 Other malaise: Secondary | ICD-10-CM | POA: Diagnosis not present

## 2013-09-05 DIAGNOSIS — M25579 Pain in unspecified ankle and joints of unspecified foot: Secondary | ICD-10-CM | POA: Diagnosis not present

## 2013-09-05 DIAGNOSIS — G589 Mononeuropathy, unspecified: Secondary | ICD-10-CM | POA: Diagnosis not present

## 2013-09-05 LAB — URIC ACID: URIC ACID, SERUM: 4.2 mg/dL (ref 4.0–7.8)

## 2013-09-05 LAB — TSH: TSH: 2.807 u[IU]/mL (ref 0.350–4.500)

## 2013-09-05 LAB — VITAMIN B12: Vitamin B-12: 498 pg/mL (ref 211–911)

## 2013-09-05 LAB — GLUCOSE, RANDOM: GLUCOSE: 101 mg/dL — AB (ref 70–99)

## 2013-09-05 LAB — FOLATE: FOLATE: 9.7 ng/mL

## 2013-09-05 NOTE — Progress Notes (Signed)
Patient notified and verbalized understanding of test results. Pt did not have any further questions. Will see ortho and did get BW done this morning.

## 2013-09-07 ENCOUNTER — Telehealth: Payer: Self-pay | Admitting: Orthopedic Surgery

## 2013-09-07 ENCOUNTER — Ambulatory Visit (INDEPENDENT_AMBULATORY_CARE_PROVIDER_SITE_OTHER): Payer: Medicare Other

## 2013-09-07 ENCOUNTER — Ambulatory Visit (INDEPENDENT_AMBULATORY_CARE_PROVIDER_SITE_OTHER): Payer: Medicare Other | Admitting: Orthopedic Surgery

## 2013-09-07 VITALS — BP 134/78 | Ht 71.0 in | Wt 190.0 lb

## 2013-09-07 DIAGNOSIS — M79671 Pain in right foot: Secondary | ICD-10-CM

## 2013-09-07 DIAGNOSIS — M79609 Pain in unspecified limb: Secondary | ICD-10-CM | POA: Diagnosis not present

## 2013-09-07 NOTE — Patient Instructions (Signed)
CAM WALKER X 8 WEEKS CONTINUE USING CANE

## 2013-09-07 NOTE — Telephone Encounter (Signed)
Calll from [place] regarding questions on cane, compression stockings, per Sharee Pimple - patient is there now. Please advise.

## 2013-09-08 NOTE — Telephone Encounter (Signed)
[facility] CALLED AND WANTS TO CONFIRM YOU WANTED PATIENT TO HAVE COMPRESSION STOCKINGS NOW WITH THE FRACTURE, STATES IT TAKES SOME FORCE TO GET THE STOCKINGS ON AND COULD BE PAINFUL/DIFFICULT TO DO WITH FRACTURE

## 2013-09-09 NOTE — Progress Notes (Signed)
Subjective:     Patient ID: Grant [name]., male   DOB: 1945/11/06, 68 y.o.   MRN: [id number]  HPI  Chief Complaint  Patient presents with  . Ankle Pain    Right ankle pain, referred by Dr. Wolfgang Phoenix.   The patient presents with pain in his ankle and foot after some remote trauma that is not really sure about. He had steroids and anti-inflammatories eventually had a bone scan which showed increased uptake in his lateral malleolus. He complains of pain in the posterior aspect of the fibula along the peroneal tendons as well as the dorsum of the foot and ankle as well as the lateral malleolus. He has difficulty weightbearing. He has swelling. He does get partial relief with an ankle sleeve  Past Medical History  Diagnosis Date  . GERD (gastroesophageal reflux disease)   . Glaucoma (increased eye pressure)    Past Surgical History  Procedure Laterality Date  . Hernia repair    . Fracture back       Review of Systems Review of systems has been recorded reviewed and signed and scanned into the chart     Objective:   Physical Exam  Vital signs are stable as recorded, BP 134/78  Ht 5\' 11"  (1.803 m)  Wt 190 lb (86.183 kg)  BMI 26.51 kg/m2    General appearance is normal, body habitus large  The patient is alert and oriented x 3  The patient's mood and affect are normal  Gait assessment: Antalgic  The cardiovascular exam reveals normal pulses and temperature without edema or  swelling.  The lymphatic system is negative for palpable lymph nodes  The sensory exam is normal.  There are no pathologic reflexes.  Balance is normal.   Exam of the right foot or swelling distal to the aspect of the end of his ankle sleeve no tenderness no. He has tenderness in the anterior ankle joint peroneal tendons and lateral malleolus. This does not affect his range of motion in his room stability remains intact Skin normal, no rash, or laceration. Provocative tests  none  A/P Imaging studies show increased uptake in the fifth metatarsal with normal ankle x-rays I took foot x-rays they were normal as well.     Assessment:     Possible stress fracture versus peroneal tendinitis    Plan:     Cam Walker, compression hose, return 8 weeks.

## 2013-09-09 NOTE — Telephone Encounter (Signed)
Do what i said   Use the largest compression hose

## 2013-09-11 NOTE — Telephone Encounter (Signed)
[place] aware

## 2013-09-14 ENCOUNTER — Other Ambulatory Visit: Payer: Self-pay | Admitting: *Deleted

## 2013-09-14 ENCOUNTER — Telehealth: Payer: Self-pay | Admitting: Family Medicine

## 2013-09-14 NOTE — Telephone Encounter (Signed)
Numb 45, same instructions and add "use sparingly"

## 2013-09-14 NOTE — Telephone Encounter (Signed)
Nurses may ref for now both, remind pt that gthese are short term meds only, try to start spacing out the oxycodone more and more and using less often

## 2013-09-14 NOTE — Telephone Encounter (Signed)
nabumetone (RELAFEN) 750 MG tablet   Pt is about done with this script, it has a refill on it. Pt wants to know if you want him to refill it? He is currently  Swollen and hurting him.   oxyCODONE-acetaminophen (PERCOCET) 7.5-325 MG per tablet  Pt is still taking these pills for the pain  States he would like a refill if your ok with that  [facility] [place]

## 2013-09-14 NOTE — Telephone Encounter (Signed)
Pt got oxycodone #50 on 08/12. Do you want to give him #50 again or a different amount.

## 2013-09-15 MED ORDER — OXYCODONE-ACETAMINOPHEN 7.5-325 MG PO TABS: ORAL_TABLET | ORAL | Status: DC

## 2013-09-15 MED ORDER — NABUMETONE 750 MG PO TABS
750.0000 mg | ORAL_TABLET | Freq: Two times a day (BID) | ORAL | Status: DC
Start: 2013-09-15 — End: 2017-04-29

## 2013-09-15 NOTE — Telephone Encounter (Signed)
Notified patient's wife that script is ready for pickup. Relafen was sent to pharmacy.

## 2013-09-22 ENCOUNTER — Ambulatory Visit: Payer: Self-pay | Admitting: Family Medicine

## 2013-09-26 ENCOUNTER — Ambulatory Visit (INDEPENDENT_AMBULATORY_CARE_PROVIDER_SITE_OTHER): Payer: Medicare Other | Admitting: Family Medicine

## 2013-09-26 ENCOUNTER — Encounter: Payer: Self-pay | Admitting: Family Medicine

## 2013-09-26 ENCOUNTER — Ambulatory Visit (HOSPITAL_COMMUNITY)
Admission: RE | Admit: 2013-09-26 | Discharge: 2013-09-26 | Disposition: A | Discharge status: Home / Self Care | Payer: Medicare Other | Source: Ambulatory Visit | Attending: Family Medicine | Admitting: Family Medicine

## 2013-09-26 VITALS — BP 142/80 | Ht 71.0 in | Wt 189.0 lb

## 2013-09-26 DIAGNOSIS — M79609 Pain in unspecified limb: Secondary | ICD-10-CM | POA: Diagnosis not present

## 2013-09-26 DIAGNOSIS — M7989 Other specified soft tissue disorders: Secondary | ICD-10-CM | POA: Insufficient documentation

## 2013-09-26 DIAGNOSIS — I70209 Unspecified atherosclerosis of native arteries of extremities, unspecified extremity: Secondary | ICD-10-CM | POA: Diagnosis not present

## 2013-09-26 DIAGNOSIS — M79671 Pain in right foot: Secondary | ICD-10-CM

## 2013-09-26 MED ORDER — OXYCODONE-ACETAMINOPHEN 7.5-325 MG PO TABS: ORAL_TABLET | ORAL | Status: DC

## 2013-09-26 NOTE — Progress Notes (Signed)
   Subjective:    Patient ID: Grant [name]., male    DOB: 1945-12-31, 68 y.o.   MRN: [id number]  HPIFollow up on right foot pain. Taking reflafen 750 BID and oxycodone 7.5/325 one - two a day and some days he does not have to take any oxycodone. Pain is worse. Saw Dr. Aline Brochure on 09/07/13. He gave a compression sock and a boot to wear. Patient states it is not helping.  Patient continues to experience ongoing multiple problems. Long-standing history of hip and knee pain. Patient states that when he approached the orthopedic doctor about this, there was not much response. Patient very frustrated by this.  Ongoing foot pain. Now with more swelling.   patient had a bone scan which suggested compression fracture versus tendinitis discussed with patient today.  Also had an element of sciatica earlier on which confuses things.  Patient has history of considerable smoking    Review of Systems No chest pain no shortness breath no abdominal pain no change in bowel habits no blood in stool    Objective:   Physical Exam Alert no acute distress. Mild malaise Lungs clear. Heart regular in rhythm. Negative straight leg raise on right. Impressive midfoot swelling. Ankle good range of motion negative Homans sign.  Crepitations evident right knee      Assessment & Plan:  Impression multifactorial pain with frustrated patient. Also complicated by now more progressive edema and in foot. I think likely represents of occult stress fracture. Complicated by knee and hip arthritis. And history recent sciatica plan right leg ultrasound, to cover for progressive edema. Podiatry referral rationale discussed to see Dr. Caprice Beaver orthopedic referral rationale discussed pain medicines written. WSL

## 2013-10-04 DIAGNOSIS — M25579 Pain in unspecified ankle and joints of unspecified foot: Secondary | ICD-10-CM | POA: Diagnosis not present

## 2013-10-04 DIAGNOSIS — M79609 Pain in unspecified limb: Secondary | ICD-10-CM | POA: Diagnosis not present

## 2013-10-04 DIAGNOSIS — R609 Edema, unspecified: Secondary | ICD-10-CM | POA: Diagnosis not present

## 2013-10-04 DIAGNOSIS — S93409A Sprain of unspecified ligament of unspecified ankle, initial encounter: Secondary | ICD-10-CM | POA: Diagnosis not present

## 2013-10-30 DIAGNOSIS — M79671 Pain in right foot: Secondary | ICD-10-CM | POA: Diagnosis not present

## 2013-10-30 DIAGNOSIS — M25571 Pain in right ankle and joints of right foot: Secondary | ICD-10-CM | POA: Diagnosis not present

## 2013-10-30 DIAGNOSIS — G629 Polyneuropathy, unspecified: Secondary | ICD-10-CM | POA: Diagnosis not present

## 2013-11-02 ENCOUNTER — Ambulatory Visit: Payer: Medicare Other | Admitting: Orthopedic Surgery

## 2015-01-02 ENCOUNTER — Ambulatory Visit (INDEPENDENT_AMBULATORY_CARE_PROVIDER_SITE_OTHER): Payer: Medicare HMO | Admitting: Urology

## 2015-01-02 ENCOUNTER — Other Ambulatory Visit: Payer: Self-pay | Admitting: Urology

## 2015-01-02 DIAGNOSIS — R972 Elevated prostate specific antigen [PSA]: Secondary | ICD-10-CM

## 2015-01-02 DIAGNOSIS — R351 Nocturia: Secondary | ICD-10-CM | POA: Diagnosis not present

## 2015-01-02 DIAGNOSIS — R3915 Urgency of urination: Secondary | ICD-10-CM | POA: Diagnosis not present

## 2015-01-02 DIAGNOSIS — N403 Nodular prostate with lower urinary tract symptoms: Secondary | ICD-10-CM | POA: Diagnosis not present

## 2015-01-02 DIAGNOSIS — N5201 Erectile dysfunction due to arterial insufficiency: Secondary | ICD-10-CM

## 2015-01-15 ENCOUNTER — Other Ambulatory Visit: Payer: Self-pay | Admitting: Urology

## 2015-01-15 DIAGNOSIS — R972 Elevated prostate specific antigen [PSA]: Secondary | ICD-10-CM

## 2015-01-16 ENCOUNTER — Ambulatory Visit (HOSPITAL_COMMUNITY)
Admission: RE | Admit: 2015-01-16 | Discharge: 2015-01-16 | Disposition: A | Discharge status: Home / Self Care | Payer: Medicare HMO | Source: Ambulatory Visit | Attending: Urology | Admitting: Urology

## 2015-01-16 ENCOUNTER — Encounter (HOSPITAL_COMMUNITY): Payer: Self-pay

## 2015-01-16 DIAGNOSIS — C61 Malignant neoplasm of prostate: Secondary | ICD-10-CM | POA: Insufficient documentation

## 2015-01-16 DIAGNOSIS — R972 Elevated prostate specific antigen [PSA]: Secondary | ICD-10-CM | POA: Diagnosis present

## 2015-01-16 MED ORDER — LIDOCAINE HCL (PF) 2 % IJ SOLN
10.0000 mL | Freq: Once | INTRAMUSCULAR | Status: AC
  Administered 2015-01-16: 10 mL

## 2015-01-16 MED ORDER — LIDOCAINE HCL (PF) 2 % IJ SOLN
INTRAMUSCULAR | Status: AC
  Administered 2015-01-16: 10 mL
  Filled 2015-01-16: qty 10

## 2015-01-16 MED ORDER — GENTAMICIN SULFATE 40 MG/ML IJ SOLN
80.0000 mg | Freq: Once | INTRAMUSCULAR | Status: AC
  Administered 2015-01-16: 80 mg via INTRAMUSCULAR

## 2015-01-16 MED ORDER — GENTAMICIN SULFATE 40 MG/ML IJ SOLN
INTRAMUSCULAR | Status: AC
  Administered 2015-01-16: 80 mg via INTRAMUSCULAR
  Filled 2015-01-16: qty 2

## 2015-01-16 NOTE — Discharge Instructions (Signed)
Transrectal Ultrasound-Guided Biopsy °A transrectal ultrasound-guided biopsy is a procedure to take samples of tissue from your prostate. Ultrasound images are used to guide the procedure. It is usually done to check the prostate gland for cancer. °BEFORE THE PROCEDURE °· Do not eat or drink after midnight on the night before your procedure. °· Take medicines as your doctor tells you. °· Your doctor may have you stop taking some medicines 5-7 days before the procedure. °· You will be given an enema before your procedure. During an enema, a liquid is put into your butt (rectum) to clear out waste. °· You may have lab tests the day of your procedure. °· Make plans to have someone drive you home. °PROCEDURE °· You will be given medicine to help you relax before the procedure. An IV tube will be put into one of your veins. It will be used to give fluids and medicine. °· You will be given medicine to reduce the risk of infection (antibiotic). °· You will be placed on your side. °· A probe with gel will be put in your butt. This is used to take pictures of your prostate and the area around it. °· A medicine to numb the area is put into your prostate. °· A biopsy needle is then inserted and guided to your prostate. °· Samples of prostate tissue are taken. The needle is removed. °· The samples are sent to a lab to be checked. Results are usually back in 2-3 days. °AFTER THE PROCEDURE °· You will be taken to a room where you will be watched until you are doing okay. °· You may have some pain in the area around your butt. You will be given medicines for this. °· You may be able to go home the same day. Sometimes, an overnight stay in the hospital is needed. °  °This information is not intended to replace advice given to you by your health care provider. Make sure you discuss any questions you have with your health care provider. °  °Document Released: 12/24/2008 Document Revised: 01/10/2013 Document Reviewed:  08/24/2012 °Elsevier Interactive Patient Education ©2016 [employer] ° °

## 2015-01-23 ENCOUNTER — Ambulatory Visit (INDEPENDENT_AMBULATORY_CARE_PROVIDER_SITE_OTHER): Payer: Medicare HMO | Admitting: Urology

## 2015-01-23 DIAGNOSIS — R351 Nocturia: Secondary | ICD-10-CM

## 2015-01-23 DIAGNOSIS — N5201 Erectile dysfunction due to arterial insufficiency: Secondary | ICD-10-CM

## 2015-01-23 DIAGNOSIS — N403 Nodular prostate with lower urinary tract symptoms: Secondary | ICD-10-CM | POA: Diagnosis not present

## 2015-01-23 DIAGNOSIS — R3915 Urgency of urination: Secondary | ICD-10-CM | POA: Diagnosis not present

## 2015-04-29 DIAGNOSIS — R972 Elevated prostate specific antigen [PSA]: Secondary | ICD-10-CM | POA: Diagnosis not present

## 2015-05-01 ENCOUNTER — Other Ambulatory Visit: Payer: Self-pay | Admitting: Urology

## 2015-05-01 ENCOUNTER — Ambulatory Visit (INDEPENDENT_AMBULATORY_CARE_PROVIDER_SITE_OTHER): Payer: Medicare HMO | Admitting: Urology

## 2015-05-01 DIAGNOSIS — R351 Nocturia: Secondary | ICD-10-CM

## 2015-05-01 DIAGNOSIS — R3915 Urgency of urination: Secondary | ICD-10-CM | POA: Diagnosis not present

## 2015-05-01 DIAGNOSIS — C61 Malignant neoplasm of prostate: Secondary | ICD-10-CM

## 2015-05-19 ENCOUNTER — Emergency Department (HOSPITAL_COMMUNITY): Payer: Medicare HMO

## 2015-05-19 ENCOUNTER — Emergency Department (HOSPITAL_COMMUNITY)
Admission: EM | Admit: 2015-05-19 | Discharge: 2015-05-19 | Disposition: A | Discharge status: Home / Self Care | Payer: Medicare HMO | Attending: Emergency Medicine | Admitting: Emergency Medicine

## 2015-05-19 ENCOUNTER — Encounter (HOSPITAL_COMMUNITY): Payer: Self-pay | Admitting: Emergency Medicine

## 2015-05-19 DIAGNOSIS — Z87891 Personal history of nicotine dependence: Secondary | ICD-10-CM | POA: Insufficient documentation

## 2015-05-19 DIAGNOSIS — R0600 Dyspnea, unspecified: Secondary | ICD-10-CM | POA: Diagnosis not present

## 2015-05-19 DIAGNOSIS — Z7982 Long term (current) use of aspirin: Secondary | ICD-10-CM | POA: Diagnosis not present

## 2015-05-19 DIAGNOSIS — R0602 Shortness of breath: Secondary | ICD-10-CM | POA: Diagnosis not present

## 2015-05-19 DIAGNOSIS — Z79899 Other long term (current) drug therapy: Secondary | ICD-10-CM | POA: Insufficient documentation

## 2015-05-19 LAB — BASIC METABOLIC PANEL
Anion gap: 8 (ref 5–15)
BUN: 23 mg/dL — ABNORMAL HIGH (ref 6–20)
CO2: 28 mmol/L (ref 22–32)
Calcium: 8.7 mg/dL — ABNORMAL LOW (ref 8.9–10.3)
Chloride: 102 mmol/L (ref 101–111)
Creatinine, Ser: 1.04 mg/dL (ref 0.61–1.24)
GFR calc Af Amer: >60 mL/min (ref >60)
GFR calc non Af Amer: >60 mL/min (ref >60)
Glucose, Bld: 96 mg/dL (ref 65–99)
Potassium: 4.2 mmol/L (ref 3.5–5.1)
Sodium: 138 mmol/L (ref 135–145)

## 2015-05-19 LAB — CBC WITH DIFFERENTIAL/PLATELET
Basophils Absolute: 0 10*3/uL (ref 0.0–0.1)
Basophils Relative: 0 %
Eosinophils Absolute: 0.4 10*3/uL (ref 0.0–0.7)
Eosinophils Relative: 7 %
HCT: 38.4 % — ABNORMAL LOW (ref 39.0–52.0)
Hemoglobin: 13.1 g/dL (ref 13.0–17.0)
Lymphocytes Relative: 29 %
Lymphs Abs: 1.6 10*3/uL (ref 0.7–4.0)
MCH: 29.1 pg (ref 26.0–34.0)
MCHC: 34.1 g/dL (ref 30.0–36.0)
MCV: 85.3 fL (ref 78.0–100.0)
Monocytes Absolute: 0.4 10*3/uL (ref 0.1–1.0)
Monocytes Relative: 7 %
Neutro Abs: 3.2 10*3/uL (ref 1.7–7.7)
Neutrophils Relative %: 57 %
Platelets: 165 10*3/uL (ref 150–400)
RBC: 4.5 MIL/uL (ref 4.22–5.81)
RDW: 13.6 % (ref 11.5–15.5)
WBC: 5.6 10*3/uL (ref 4.0–10.5)

## 2015-05-19 LAB — BRAIN NATRIURETIC PEPTIDE: B Natriuretic Peptide: 28 pg/mL (ref 0.0–100.0)

## 2015-05-19 LAB — TROPONIN I: Troponin I: <0.03 ng/mL (ref <0.031)

## 2015-05-19 MED ORDER — GI COCKTAIL ~~LOC~~
30.0000 mL | Freq: Once | ORAL | Status: AC
  Administered 2015-05-19: 30 mL via ORAL
  Filled 2015-05-19: qty 30

## 2015-05-19 NOTE — Discharge Instructions (Signed)

## 2015-05-19 NOTE — ED Notes (Signed)
Pt states he is fine at this time but he gets short of breathe with short walks.

## 2015-05-19 NOTE — ED Provider Notes (Signed)
CSN: [id number]     Arrival date & time 05/19/15  1506 History   First MD Initiated Contact with Patient 05/19/15 1531     Chief Complaint  Patient presents with  . Shortness of Breath     (Consider location/radiation/quality/duration/timing/severity/associated sxs/prior Treatment) HPI   70 year old male with dyspnea. Onset about a month ago. Panel waxing and waning. He has a sensation that he does not get a breath. Some mild discomfort in his epigastric region and sternum. Does not radiate. Hasn't noticed a change with exertion. No fevers or chills. No significant cough. No unusual leg pain or swelling. He sometimes noticed that the pain is worse when bending forward such as when tying his shoes.  Past Medical History  Diagnosis Date  . GERD (gastroesophageal reflux disease)   . Glaucoma (increased eye pressure)    Past Surgical History  Procedure Laterality Date  . Hernia repair    . Fracture back     Family History  Problem Relation Age of Onset  . Diabetes Mother   . Lupus Mother   . Prostate cancer Father    Social History  Substance Use Topics  . Smoking status: Former Smoker    Types: Cigarettes    Quit date: 03/21/2014  . Smokeless tobacco: None  . Alcohol Use: Yes     Comment: occ    Review of Systems  All systems reviewed and negative, other than as noted in HPI.   Allergies  Augmentin  Home Medications   Prior to Admission medications   Medication Sig Start Date End Date Taking? Authorizing Provider  aspirin 325 MG tablet Take 325 mg by mouth daily as needed. For pain    Historical Provider, MD  nabumetone (RELAFEN) 750 MG tablet Take 1 tablet (750 mg total) by mouth 2 (two) times daily. 09/15/13   Mikey Kirschner, MD  oxyCODONE-acetaminophen (PERCOCET) 7.5-325 MG per tablet Take one tablet up to BID as needed for pain. 09/26/13   Mikey Kirschner, MD   BP 153/67 mmHg  Pulse 77  Temp(Src) 98.1 F (36.7 C) (Oral)  Resp 18  Ht 5\' 11"  (1.803 m)  Wt  189 lb (85.73 kg)  BMI 26.37 kg/m2  SpO2 98% Physical Exam  Constitutional: He appears well-developed and well-nourished. No distress.  HENT:  Head: Normocephalic and atraumatic.  Eyes: Conjunctivae are normal. Right eye exhibits no discharge. Left eye exhibits no discharge.  Neck: Neck supple.  Cardiovascular: Normal rate, regular rhythm and normal heart sounds.  Exam reveals no gallop and no friction rub.   No murmur heard. Pulmonary/Chest: Effort normal and breath sounds normal. No respiratory distress.  Abdominal: Soft. He exhibits no distension. There is no tenderness.  Musculoskeletal: He exhibits no edema or tenderness.  Lower extremities symmetric as compared to each other. No calf tenderness. Negative Homan's. No palpable cords.   Neurological: He is alert.  Skin: Skin is warm and dry.  Psychiatric: He has a normal mood and affect. His behavior is normal. Thought content normal.  Nursing note and vitals reviewed.   ED Course  Procedures (including critical care time) Labs Review Labs Reviewed  BASIC METABOLIC PANEL  CBC WITH DIFFERENTIAL/PLATELET  TROPONIN I  BRAIN NATRIURETIC PEPTIDE    Imaging Review No results found. I have personally reviewed and evaluated these images and lab results as part of my medical decision-making.   EKG Interpretation   Date/Time:  Sunday May 19 2015 15:26:12 EDT Ventricular Rate:  77 PR Interval:  148  QRS Duration: 96 QT Interval:  380 QTC Calculation: 430 R Axis:   71 Text Interpretation:  Normal sinus rhythm similar to previous EKG from  10/2009 Confirmed by [place]  MD, Ivory Bail ([id number]) on 05/19/2015 3:33:32 PM      MDM   Final diagnoses:  Dyspnea    69yM with dyspnea. Suspect this may be from reflux/esophagitis? Atypical for ACS. Normal BNP pretty much excludes HF. Not anemic. CXR clear. Doubt PE. No signs/symptom of DVT. No clearly identifiable risk factors for PE. Not tachycardic, tachypneic or hypoxic. Nursing note  mentions increasing shortness of breath with exertion, but he is not clearly describing this to me.   Has sensation that he just cannot get a deep breath. Symptoms worse at night to point of waking up symptomatic. Also reports some epigastric burning. Long smoking hx, but lungs are clear.   He just restarted taking prilosec. Advised him to try taking this regularly. Return precautions were discussed. Outpatient follow-up with his PCP otherwise.    Virgel Manifold, MD 05/27/15 1530

## 2015-05-19 NOTE — ED Notes (Signed)
Sob for last month (on and off) and has increased since Thursday.  Denies any other discomfort.  No edema in BLE.

## 2015-05-19 NOTE — ED Notes (Signed)
Pt left ED ambulatory with no signs of distress. Pt verbalizes discharge instructions. 

## 2015-06-18 DIAGNOSIS — Z0100 Encounter for examination of eyes and vision without abnormal findings: Secondary | ICD-10-CM | POA: Diagnosis not present

## 2015-06-18 DIAGNOSIS — H524 Presbyopia: Secondary | ICD-10-CM | POA: Diagnosis not present

## 2015-07-31 ENCOUNTER — Ambulatory Visit (HOSPITAL_COMMUNITY)
Admission: RE | Admit: 2015-07-31 | Discharge: 2015-07-31 | Disposition: A | Discharge status: Home / Self Care | Payer: Medicare HMO | Source: Ambulatory Visit | Attending: Urology | Admitting: Urology

## 2015-07-31 DIAGNOSIS — N4232 Atypical small acinar proliferation of prostate: Secondary | ICD-10-CM | POA: Diagnosis not present

## 2015-07-31 DIAGNOSIS — C61 Malignant neoplasm of prostate: Secondary | ICD-10-CM

## 2015-07-31 MED ORDER — GENTAMICIN SULFATE 40 MG/ML IJ SOLN
80.0000 mg | Freq: Once | INTRAMUSCULAR | Status: AC
  Administered 2015-07-31: 80 mg via INTRAMUSCULAR

## 2015-07-31 MED ORDER — LIDOCAINE HCL (PF) 2 % IJ SOLN
10.0000 mL | Freq: Once | INTRAMUSCULAR | Status: AC
  Administered 2015-07-31: 10 mL

## 2015-07-31 MED ORDER — LIDOCAINE HCL (PF) 2 % IJ SOLN
INTRAMUSCULAR | Status: AC
  Administered 2015-07-31: 10 mL
  Filled 2015-07-31: qty 10

## 2015-07-31 MED ORDER — GENTAMICIN SULFATE 40 MG/ML IJ SOLN
INTRAMUSCULAR | Status: AC
  Administered 2015-07-31: 80 mg via INTRAMUSCULAR
  Filled 2015-07-31: qty 2

## 2015-07-31 NOTE — Discharge Instructions (Signed)
Transrectal Ultrasound-Guided Biopsy °A transrectal ultrasound-guided biopsy is a procedure to take samples of tissue from your prostate. Ultrasound images are used to guide the procedure. It is usually done to check the prostate gland for cancer. °BEFORE THE PROCEDURE °· Do not eat or drink after midnight on the night before your procedure. °· Take medicines as your doctor tells you. °· Your doctor may have you stop taking some medicines 5-7 days before the procedure. °· You will be given an enema before your procedure. During an enema, a liquid is put into your butt (rectum) to clear out waste. °· You may have lab tests the day of your procedure. °· Make plans to have someone drive you home. °PROCEDURE °· You will be given medicine to help you relax before the procedure. An IV tube will be put into one of your veins. It will be used to give fluids and medicine. °· You will be given medicine to reduce the risk of infection (antibiotic). °· You will be placed on your side. °· A probe with gel will be put in your butt. This is used to take pictures of your prostate and the area around it. °· A medicine to numb the area is put into your prostate. °· A biopsy needle is then inserted and guided to your prostate. °· Samples of prostate tissue are taken. The needle is removed. °· The samples are sent to a lab to be checked. Results are usually back in 2-3 days. °AFTER THE PROCEDURE °· You will be taken to a room where you will be watched until you are doing okay. °· You may have some pain in the area around your butt. You will be given medicines for this. °· You may be able to go home the same day. Sometimes, an overnight stay in the hospital is needed. °  °This information is not intended to replace advice given to you by your health care provider. Make sure you discuss any questions you have with your health care provider. °  °Document Released: 12/24/2008 Document Revised: 01/10/2013 Document Reviewed:  08/24/2012 °Elsevier Interactive Patient Education ©2016 [employer] ° °

## 2015-08-14 ENCOUNTER — Ambulatory Visit: Payer: Medicare HMO | Admitting: Urology

## 2015-11-01 DIAGNOSIS — C61 Malignant neoplasm of prostate: Secondary | ICD-10-CM | POA: Diagnosis not present

## 2015-11-06 ENCOUNTER — Ambulatory Visit (INDEPENDENT_AMBULATORY_CARE_PROVIDER_SITE_OTHER): Payer: Medicare HMO | Admitting: Urology

## 2015-11-06 DIAGNOSIS — C61 Malignant neoplasm of prostate: Secondary | ICD-10-CM

## 2015-11-06 DIAGNOSIS — R351 Nocturia: Secondary | ICD-10-CM

## 2015-11-06 DIAGNOSIS — N401 Enlarged prostate with lower urinary tract symptoms: Secondary | ICD-10-CM | POA: Diagnosis not present

## 2015-12-25 DIAGNOSIS — H00021 Hordeolum internum right upper eyelid: Secondary | ICD-10-CM | POA: Diagnosis not present

## 2016-02-04 DIAGNOSIS — C61 Malignant neoplasm of prostate: Secondary | ICD-10-CM | POA: Diagnosis not present

## 2016-02-05 ENCOUNTER — Ambulatory Visit: Payer: Medicare HMO | Admitting: Urology

## 2016-03-04 ENCOUNTER — Ambulatory Visit (INDEPENDENT_AMBULATORY_CARE_PROVIDER_SITE_OTHER): Payer: Medicare HMO | Admitting: Urology

## 2016-03-04 DIAGNOSIS — N401 Enlarged prostate with lower urinary tract symptoms: Secondary | ICD-10-CM

## 2016-03-04 DIAGNOSIS — R351 Nocturia: Secondary | ICD-10-CM

## 2016-03-04 DIAGNOSIS — C61 Malignant neoplasm of prostate: Secondary | ICD-10-CM

## 2016-06-01 DIAGNOSIS — C61 Malignant neoplasm of prostate: Secondary | ICD-10-CM | POA: Diagnosis not present

## 2016-06-03 ENCOUNTER — Ambulatory Visit (INDEPENDENT_AMBULATORY_CARE_PROVIDER_SITE_OTHER): Payer: Medicare HMO | Admitting: Urology

## 2016-06-03 DIAGNOSIS — R351 Nocturia: Secondary | ICD-10-CM

## 2016-06-03 DIAGNOSIS — C61 Malignant neoplasm of prostate: Secondary | ICD-10-CM | POA: Diagnosis not present

## 2016-06-03 DIAGNOSIS — N401 Enlarged prostate with lower urinary tract symptoms: Secondary | ICD-10-CM

## 2016-06-12 ENCOUNTER — Other Ambulatory Visit: Payer: Self-pay | Admitting: Urology

## 2016-06-12 DIAGNOSIS — C61 Malignant neoplasm of prostate: Secondary | ICD-10-CM

## 2016-06-24 ENCOUNTER — Other Ambulatory Visit: Payer: Self-pay | Admitting: Urology

## 2016-06-24 ENCOUNTER — Ambulatory Visit (HOSPITAL_COMMUNITY)
Admission: RE | Admit: 2016-06-24 | Discharge: 2016-06-24 | Disposition: A | Discharge status: Home / Self Care | Payer: Medicare HMO | Source: Ambulatory Visit | Attending: Urology | Admitting: Urology

## 2016-06-24 DIAGNOSIS — C61 Malignant neoplasm of prostate: Secondary | ICD-10-CM

## 2016-06-24 DIAGNOSIS — Z1389 Encounter for screening for other disorder: Secondary | ICD-10-CM | POA: Diagnosis not present

## 2016-06-24 DIAGNOSIS — Z77018 Contact with and (suspected) exposure to other hazardous metals: Secondary | ICD-10-CM | POA: Diagnosis not present

## 2016-06-24 LAB — POCT I-STAT CREATININE: CREATININE: 1.1 mg/dL (ref 0.61–1.24)

## 2016-06-24 MED ORDER — LIDOCAINE HCL 2 % EX GEL
CUTANEOUS | Status: AC
  Filled 2016-06-24: qty 30

## 2016-06-24 MED ORDER — LIDOCAINE HCL 2 % EX GEL: 1.0000 "application " | Freq: Once | CUTANEOUS | Status: DC

## 2016-06-24 MED ORDER — GADOBENATE DIMEGLUMINE 529 MG/ML IV SOLN
20.0000 mL | Freq: Once | INTRAVENOUS | Status: AC | PRN
  Administered 2016-06-24: 20 mL via INTRAVENOUS

## 2016-08-31 DIAGNOSIS — C61 Malignant neoplasm of prostate: Secondary | ICD-10-CM | POA: Diagnosis not present

## 2016-09-02 ENCOUNTER — Ambulatory Visit (INDEPENDENT_AMBULATORY_CARE_PROVIDER_SITE_OTHER): Payer: Medicare HMO | Admitting: Urology

## 2016-09-02 DIAGNOSIS — C61 Malignant neoplasm of prostate: Secondary | ICD-10-CM

## 2016-09-02 DIAGNOSIS — N401 Enlarged prostate with lower urinary tract symptoms: Secondary | ICD-10-CM

## 2016-09-02 DIAGNOSIS — R351 Nocturia: Secondary | ICD-10-CM | POA: Diagnosis not present

## 2017-02-03 DIAGNOSIS — Z2821 Immunization not carried out because of patient refusal: Secondary | ICD-10-CM | POA: Diagnosis not present

## 2017-02-03 DIAGNOSIS — C61 Malignant neoplasm of prostate: Secondary | ICD-10-CM | POA: Diagnosis not present

## 2017-02-19 DIAGNOSIS — C61 Malignant neoplasm of prostate: Secondary | ICD-10-CM | POA: Diagnosis not present

## 2017-02-19 DIAGNOSIS — Z2821 Immunization not carried out because of patient refusal: Secondary | ICD-10-CM | POA: Diagnosis not present

## 2017-02-24 ENCOUNTER — Ambulatory Visit (INDEPENDENT_AMBULATORY_CARE_PROVIDER_SITE_OTHER): Payer: Medicare HMO | Admitting: Urology

## 2017-02-24 DIAGNOSIS — C61 Malignant neoplasm of prostate: Secondary | ICD-10-CM | POA: Diagnosis not present

## 2017-02-24 DIAGNOSIS — R351 Nocturia: Secondary | ICD-10-CM | POA: Diagnosis not present

## 2017-02-24 DIAGNOSIS — N401 Enlarged prostate with lower urinary tract symptoms: Secondary | ICD-10-CM | POA: Diagnosis not present

## 2017-03-10 DIAGNOSIS — Z8546 Personal history of malignant neoplasm of prostate: Secondary | ICD-10-CM | POA: Diagnosis not present

## 2017-03-10 DIAGNOSIS — E785 Hyperlipidemia, unspecified: Secondary | ICD-10-CM | POA: Diagnosis not present

## 2017-03-10 DIAGNOSIS — E119 Type 2 diabetes mellitus without complications: Secondary | ICD-10-CM | POA: Diagnosis not present

## 2017-03-10 DIAGNOSIS — Z2821 Immunization not carried out because of patient refusal: Secondary | ICD-10-CM | POA: Diagnosis not present

## 2017-03-10 DIAGNOSIS — Z0001 Encounter for general adult medical examination with abnormal findings: Secondary | ICD-10-CM | POA: Diagnosis not present

## 2017-03-10 DIAGNOSIS — C61 Malignant neoplasm of prostate: Secondary | ICD-10-CM | POA: Diagnosis not present

## 2017-03-10 DIAGNOSIS — R7301 Impaired fasting glucose: Secondary | ICD-10-CM | POA: Diagnosis not present

## 2017-04-26 DIAGNOSIS — G9009 Other idiopathic peripheral autonomic neuropathy: Secondary | ICD-10-CM | POA: Diagnosis not present

## 2017-04-26 DIAGNOSIS — E119 Type 2 diabetes mellitus without complications: Secondary | ICD-10-CM | POA: Diagnosis not present

## 2017-04-26 DIAGNOSIS — R079 Chest pain, unspecified: Secondary | ICD-10-CM | POA: Diagnosis not present

## 2017-04-26 DIAGNOSIS — E782 Mixed hyperlipidemia: Secondary | ICD-10-CM | POA: Diagnosis not present

## 2017-04-29 DIAGNOSIS — I358 Other nonrheumatic aortic valve disorders: Secondary | ICD-10-CM | POA: Diagnosis not present

## 2017-04-29 DIAGNOSIS — E119 Type 2 diabetes mellitus without complications: Secondary | ICD-10-CM | POA: Diagnosis not present

## 2017-04-29 DIAGNOSIS — I209 Angina pectoris, unspecified: Secondary | ICD-10-CM | POA: Diagnosis not present

## 2017-04-29 DIAGNOSIS — R011 Cardiac murmur, unspecified: Secondary | ICD-10-CM | POA: Diagnosis not present

## 2017-04-29 DIAGNOSIS — E782 Mixed hyperlipidemia: Secondary | ICD-10-CM | POA: Diagnosis not present

## 2017-04-30 DIAGNOSIS — I358 Other nonrheumatic aortic valve disorders: Secondary | ICD-10-CM | POA: Diagnosis not present

## 2017-04-30 DIAGNOSIS — R0789 Other chest pain: Secondary | ICD-10-CM | POA: Diagnosis not present

## 2017-05-04 ENCOUNTER — Ambulatory Visit (HOSPITAL_COMMUNITY): Admission: RE | Admit: 2017-05-04 | Payer: Medicare HMO | Source: Ambulatory Visit | Admitting: Cardiology

## 2017-05-04 ENCOUNTER — Encounter (HOSPITAL_COMMUNITY): Admission: RE | Payer: Self-pay | Source: Ambulatory Visit

## 2017-05-04 SURGERY — LEFT HEART CATH AND CORONARY ANGIOGRAPHY
Anesthesia: LOCAL

## 2017-05-19 DIAGNOSIS — E782 Mixed hyperlipidemia: Secondary | ICD-10-CM | POA: Diagnosis not present

## 2017-05-19 DIAGNOSIS — E119 Type 2 diabetes mellitus without complications: Secondary | ICD-10-CM | POA: Diagnosis not present

## 2017-05-19 DIAGNOSIS — I739 Peripheral vascular disease, unspecified: Secondary | ICD-10-CM | POA: Diagnosis not present

## 2017-05-19 DIAGNOSIS — D509 Iron deficiency anemia, unspecified: Secondary | ICD-10-CM | POA: Diagnosis not present

## 2017-06-10 DIAGNOSIS — I739 Peripheral vascular disease, unspecified: Secondary | ICD-10-CM | POA: Diagnosis not present

## 2017-08-17 DIAGNOSIS — E782 Mixed hyperlipidemia: Secondary | ICD-10-CM | POA: Diagnosis not present

## 2017-08-17 DIAGNOSIS — C61 Malignant neoplasm of prostate: Secondary | ICD-10-CM | POA: Diagnosis not present

## 2017-08-19 DIAGNOSIS — E782 Mixed hyperlipidemia: Secondary | ICD-10-CM | POA: Diagnosis not present

## 2017-08-19 DIAGNOSIS — I10 Essential (primary) hypertension: Secondary | ICD-10-CM | POA: Diagnosis not present

## 2017-08-19 DIAGNOSIS — E119 Type 2 diabetes mellitus without complications: Secondary | ICD-10-CM | POA: Diagnosis not present

## 2017-08-19 DIAGNOSIS — I739 Peripheral vascular disease, unspecified: Secondary | ICD-10-CM | POA: Diagnosis not present

## 2017-08-29 ENCOUNTER — Emergency Department (HOSPITAL_COMMUNITY)
Admission: EM | Admit: 2017-08-29 | Discharge: 2017-08-29 | Disposition: A | Discharge status: Home / Self Care | Payer: Medicare HMO | Attending: Emergency Medicine | Admitting: Emergency Medicine

## 2017-08-29 ENCOUNTER — Encounter (HOSPITAL_COMMUNITY): Payer: Self-pay | Admitting: *Deleted

## 2017-08-29 ENCOUNTER — Other Ambulatory Visit: Payer: Self-pay

## 2017-08-29 ENCOUNTER — Emergency Department (HOSPITAL_COMMUNITY): Payer: Medicare HMO

## 2017-08-29 DIAGNOSIS — Z87891 Personal history of nicotine dependence: Secondary | ICD-10-CM | POA: Insufficient documentation

## 2017-08-29 DIAGNOSIS — Z7982 Long term (current) use of aspirin: Secondary | ICD-10-CM | POA: Diagnosis not present

## 2017-08-29 DIAGNOSIS — M542 Cervicalgia: Secondary | ICD-10-CM

## 2017-08-29 DIAGNOSIS — Z79899 Other long term (current) drug therapy: Secondary | ICD-10-CM | POA: Insufficient documentation

## 2017-08-29 DIAGNOSIS — R200 Anesthesia of skin: Secondary | ICD-10-CM | POA: Diagnosis not present

## 2017-08-29 LAB — BASIC METABOLIC PANEL
Anion gap: 9 (ref 5–15)
BUN: 19 mg/dL (ref 8–23)
CALCIUM: 9 mg/dL (ref 8.9–10.3)
CO2: 28 mmol/L (ref 22–32)
CREATININE: 1.12 mg/dL (ref 0.61–1.24)
Chloride: 102 mmol/L (ref 98–111)
GFR calc Af Amer: >60 mL/min (ref >60)
GFR calc non Af Amer: >60 mL/min (ref >60)
GLUCOSE: 174 mg/dL — AB (ref 70–99)
Potassium: 4.2 mmol/L (ref 3.5–5.1)
Sodium: 139 mmol/L (ref 135–145)

## 2017-08-29 LAB — CBC WITH DIFFERENTIAL/PLATELET
BASOS PCT: 0 %
Basophils Absolute: 0 10*3/uL (ref 0.0–0.1)
EOS ABS: 0.3 10*3/uL (ref 0.0–0.7)
Eosinophils Relative: 5 %
HCT: 36.1 % — ABNORMAL LOW (ref 39.0–52.0)
Hemoglobin: 12 g/dL — ABNORMAL LOW (ref 13.0–17.0)
Lymphocytes Relative: 26 %
Lymphs Abs: 1.5 10*3/uL (ref 0.7–4.0)
MCH: 28.7 pg (ref 26.0–34.0)
MCHC: 33.2 g/dL (ref 30.0–36.0)
MCV: 86.4 fL (ref 78.0–100.0)
MONO ABS: 0.6 10*3/uL (ref 0.1–1.0)
MONOS PCT: 10 %
Neutro Abs: 3.3 10*3/uL (ref 1.7–7.7)
Neutrophils Relative %: 59 %
Platelets: 177 10*3/uL (ref 150–400)
RBC: 4.18 MIL/uL — ABNORMAL LOW (ref 4.22–5.81)
RDW: 14.1 % (ref 11.5–15.5)
WBC: 5.6 10*3/uL (ref 4.0–10.5)

## 2017-08-29 LAB — TROPONIN I

## 2017-08-29 NOTE — ED Provider Notes (Signed)
[name] EMERGENCY DEPARTMENT Provider Note   CSN: [id number] Arrival date & time: 08/29/17  2010     History   Chief Complaint Chief Complaint  Patient presents with  . Numbness    right side since last night around 2200    HPI Grant [name] Grant [name]. is a 72 y.o. male.  Patient reports right shoulder and right arm pain on 0400 on Friday morning.  Patient initially blames his symptoms on sequelae from an accident while on his mower.  He has tried aspirin for his pain with minimal success.  Approximately 1900 this evening, his pain radiated to the right hand with some numbness and decreased grip strength in the hand.  He also states inability to extend his third and fourth digit.  No mental status changes, confusion, facial asymmetry, lower extremity weakness or numbness, visual changes.  Severity of symptoms is moderate.  Nothing makes symptoms better or worse.     Past Medical History:  Diagnosis Date  . GERD (gastroesophageal reflux disease)   . Glaucoma (increased eye pressure)     Patient Active Problem List   Diagnosis Date Noted  . Esophageal reflux 04/10/2012    Past Surgical History:  Procedure Laterality Date  . fracture back    . HERNIA REPAIR          Home Medications    Prior to Admission medications   Medication Sig Start Date End Date Taking? Authorizing Provider  alfuzosin (UROXATRAL) 10 MG 24 hr tablet Take 10 mg by mouth daily. 04/01/17  Yes [provider]  aspirin 325 MG tablet Take 650 mg by mouth every 6 (six) hours as needed for mild pain. For pain    Yes [provider]  cilostazol (PLETAL) 50 MG tablet Take 1 tablet by mouth 2 (two) times daily. 08/19/17  Yes [provider]  Multiple Vitamins-Minerals (MULTIVITAMIN WITH MINERALS) tablet Take 1 tablet by mouth daily.   Yes [provider]  rosuvastatin (CRESTOR) 20 MG tablet Take 20 mg by mouth daily. 08/14/17  Yes [provider]    Family  History Family History  Problem Relation Age of Onset  . Diabetes Mother   . Lupus Mother   . Prostate cancer Father     Social History Social History   Tobacco Use  . Smoking status: Former Smoker    Types: Cigarettes    Last attempt to quit: 03/21/2014    Years since quitting: 3.4  . Smokeless tobacco: Never Used  Substance Use Topics  . Alcohol use: Yes    Comment: occ  . Drug use: No     Allergies   Augmentin [amoxicillin-pot clavulanate]   Review of Systems Review of Systems  All other systems reviewed and are negative.    Physical Exam Updated Vital Signs BP (!) 154/71   Pulse 77   Resp 17   Ht 5\' 11"  (1.803 m)   Wt 95.3 kg   SpO2 95%   BMI 29.29 kg/m   Physical Exam  Constitutional: He is oriented to person, place, and time. He appears well-developed and well-nourished.  HENT:  Head: Normocephalic and atraumatic.  Eyes: Conjunctivae are normal.  Neck: Neck supple.  Minimal tenderness on the right posterior lateral inferior aspect of the neck  Cardiovascular: Normal rate and regular rhythm.  Pulmonary/Chest: Effort normal and breath sounds normal.  Abdominal: Soft. Bowel sounds are normal.  Musculoskeletal: Normal range of motion.  Neurological: He is alert and oriented to person, place,  and time.  Full range of motion of all 4 extremities.  Questionable decreased grip strength in the right hand c inability to fully extend digits 3 and 4  Skin: Skin is warm and dry.  Psychiatric: He has a normal mood and affect. His behavior is normal.  Nursing note and vitals reviewed.    ED Treatments / Results  Labs (all labs ordered are listed, but only abnormal results are displayed) Labs Reviewed  BASIC METABOLIC PANEL - Abnormal; Notable for the following components:      Result Value   Glucose, Bld 174 (*)    All other components within normal limits  CBC WITH DIFFERENTIAL/PLATELET - Abnormal; Notable for the following components:   RBC 4.18 (*)     Hemoglobin 12.0 (*)    HCT 36.1 (*)    All other components within normal limits  TROPONIN I    EKG EKG Interpretation  Date/Time:  Sunday August 29 2017 20:23:26 EDT Ventricular Rate:  87 PR Interval:    QRS Duration: 109 QT Interval:  378 QTC Calculation: 455 R Axis:   95 Text Interpretation:  Sinus rhythm Right axis deviation Abnormal inferior Q waves Confirmed by Nat Christen [phone]) on 08/29/2017 9:19:47 PM   Radiology Ct Head Wo Contrast  Result Date: 08/29/2017 CLINICAL DATA:  Right-sided numbness and weakness for several hours EXAM: CT HEAD WITHOUT CONTRAST CT CERVICAL SPINE WITHOUT CONTRAST TECHNIQUE: Multidetector CT imaging of the head and cervical spine was performed following the standard protocol without intravenous contrast. Multiplanar CT image reconstructions of the cervical spine were also generated. COMPARISON:  None. FINDINGS: CT HEAD FINDINGS Brain: No evidence of acute infarction, hemorrhage, hydrocephalus, extra-axial collection or mass lesion/mass effect. Vascular: No hyperdense vessel or unexpected calcification. Skull: Normal. Negative for fracture or focal lesion. Sinuses/Orbits: No acute finding. Other: None. CT CERVICAL SPINE FINDINGS Alignment: Normal. Skull base and vertebrae: 7 cervical segments are well visualized. Disc space narrowing is noted from C4-C7 with mild osteophytic changes. Mild facet hypertrophic changes are noted as well. No acute fracture or acute facet abnormality is seen. Mild neural foraminal narrowing is noted at multiple levels bilaterally. Soft tissues and spinal canal: Surrounding soft tissues are within normal limits. Carotid calcifications are seen. Upper chest: Within normal limits. Other: None IMPRESSION: CT of the head: No acute intracranial abnormality noted. CT of the cervical spine: Multilevel degenerative change without acute abnormality. Electronically Signed   By: Inez Catalina M.D.   On: 08/29/2017 21:48   Ct Cervical Spine Wo  Contrast  Result Date: 08/29/2017 CLINICAL DATA:  Right-sided numbness and weakness for several hours EXAM: CT HEAD WITHOUT CONTRAST CT CERVICAL SPINE WITHOUT CONTRAST TECHNIQUE: Multidetector CT imaging of the head and cervical spine was performed following the standard protocol without intravenous contrast. Multiplanar CT image reconstructions of the cervical spine were also generated. COMPARISON:  None. FINDINGS: CT HEAD FINDINGS Brain: No evidence of acute infarction, hemorrhage, hydrocephalus, extra-axial collection or mass lesion/mass effect. Vascular: No hyperdense vessel or unexpected calcification. Skull: Normal. Negative for fracture or focal lesion. Sinuses/Orbits: No acute finding. Other: None. CT CERVICAL SPINE FINDINGS Alignment: Normal. Skull base and vertebrae: 7 cervical segments are well visualized. Disc space narrowing is noted from C4-C7 with mild osteophytic changes. Mild facet hypertrophic changes are noted as well. No acute fracture or acute facet abnormality is seen. Mild neural foraminal narrowing is noted at multiple levels bilaterally. Soft tissues and spinal canal: Surrounding soft tissues are within normal limits. Carotid calcifications are seen.  Upper chest: Within normal limits. Other: None IMPRESSION: CT of the head: No acute intracranial abnormality noted. CT of the cervical spine: Multilevel degenerative change without acute abnormality. Electronically Signed   By: Inez Catalina M.D.   On: 08/29/2017 21:48    Procedures Procedures (including critical care time)  Medications Ordered in ED Medications - No data to display   Initial Impression / Assessment and Plan / ED Course  I have reviewed the triage vital signs and the nursing notes.  Pertinent labs & imaging results that were available during my care of the patient were reviewed by me and considered in my medical decision making (see chart for details).     Patient presents with neck pain, right arm numbness,  inability to flex his third and fourth digit.  Symptoms began early Friday morning.  CT of head revealed no acute anomaly.  CT cervical spine exposed multilevel degenerative changes without acute abnormality.  I obtained a verbal consultation with the tele-neurologist on-call.  We both thought an MRI of the brain and cervical spine were indicated.  I discussed these options with the patient and his wife.  He is very lucid.  He would prefer to be worked up as an outpatient.  We discussed the possibility of a stroke versus radicular symptoms.  Outpatient MRI of brain and cervical spine were ordered per his request.   CRITICAL CARE Performed by: Nat Christen Total critical care time: 30 minutes Critical care time was exclusive of separately billable procedures and treating other patients. Critical care was necessary to treat or prevent imminent or life-threatening deterioration. Critical care was time spent personally by me on the following activities: development of treatment plan with patient and/or surrogate as well as nursing, discussions with consultants, evaluation of patient's response to treatment, examination of patient, obtaining history from patient or surrogate, ordering and performing treatments and interventions, ordering and review of laboratory studies, ordering and review of radiographic studies, pulse oximetry and re-evaluation of patient's condition.  Final Clinical Impressions(s) / ED Diagnoses   Final diagnoses:  Neck pain  Right arm numbness    ED Discharge Orders         Ordered    MR BRAIN WO CONTRAST     08/29/17 2303    MR CERVICAL SPINE WO CONTRAST     08/29/17 2303           Nat Christen, MD 08/29/17 2318

## 2017-08-29 NOTE — ED Notes (Signed)
EKG handed to Dr Cook 

## 2017-08-29 NOTE — ED Triage Notes (Signed)
Pt with right sided numbness since last night around 2200, denies HA, equal grips. Pt states right side weaker than left.

## 2017-08-29 NOTE — Discharge Instructions (Signed)
MRI of brain and MRI of cervical spine will be done as an outpatient.  RN will give you further information.

## 2017-08-30 ENCOUNTER — Ambulatory Visit (HOSPITAL_COMMUNITY)
Admission: RE | Admit: 2017-08-30 | Discharge: 2017-08-30 | Disposition: A | Discharge status: Home / Self Care | Payer: Medicare HMO | Source: Ambulatory Visit | Attending: Emergency Medicine | Admitting: Emergency Medicine

## 2017-08-30 DIAGNOSIS — M4802 Spinal stenosis, cervical region: Secondary | ICD-10-CM | POA: Diagnosis not present

## 2017-08-30 DIAGNOSIS — R200 Anesthesia of skin: Secondary | ICD-10-CM | POA: Diagnosis not present

## 2017-08-30 DIAGNOSIS — M542 Cervicalgia: Secondary | ICD-10-CM | POA: Diagnosis not present

## 2017-08-31 DIAGNOSIS — Z6829 Body mass index (BMI) 29.0-29.9, adult: Secondary | ICD-10-CM | POA: Diagnosis not present

## 2017-08-31 DIAGNOSIS — M25511 Pain in right shoulder: Secondary | ICD-10-CM | POA: Diagnosis not present

## 2017-08-31 DIAGNOSIS — R531 Weakness: Secondary | ICD-10-CM | POA: Diagnosis not present

## 2017-08-31 DIAGNOSIS — R202 Paresthesia of skin: Secondary | ICD-10-CM | POA: Diagnosis not present

## 2017-09-02 ENCOUNTER — Other Ambulatory Visit: Payer: Self-pay | Admitting: Neurosurgery

## 2017-09-02 DIAGNOSIS — M4802 Spinal stenosis, cervical region: Secondary | ICD-10-CM | POA: Diagnosis not present

## 2017-09-08 ENCOUNTER — Other Ambulatory Visit: Payer: Self-pay

## 2017-09-08 ENCOUNTER — Encounter (HOSPITAL_COMMUNITY): Payer: Self-pay | Admitting: *Deleted

## 2017-09-08 NOTE — Progress Notes (Signed)
Anesthesia Chart Review: SAME DAY WORK-UP  Case:  [id number] Date/Time:  09/10/17 1500   Procedure:  LAMINECTOMY CERVICAL 4- CERVICAL 7, POSTERIOR SEGMENTAL INSTRUMENTATION, POSTERIOR LATERAL ARTHRODESIS (N/A ) - LAMINECTOMY CERVICAL 4- CERVICAL 7, POSTERIOR SEGMENTAL INSTRUMENTATION, POSTERIOR LATERAL ARTHRODESIS   Anesthesia type:  General   Pre-op diagnosis:  CERVICAL STENOSIS OF SPINE   Location:  [facility] ROOM 43 / [facility]   Surgeon:  Consuella Lose, MD      DISCUSSION: Patient is a 72 year old male scheduled for the above procedure. History includes former smoker (quit '16), glaucoma, GERD, prostate cancer. Cardiology notes also indicate a history of DM2 ("borderline diabetes") with A1c of 6.9 on 02/19/17. He also had elevated BP (167/77, 156/79), but patient was reluctant to start antihypertensive medications at that time, and Dr. Virgina Jock deferred to his PCP.    Patient has cardiac clearance at "normal/low risk" by Dr. Virgina Jock.  Reported last ASA dose was on 09/03/17 and last Pletal dose was 08/19/17 for follow-up claudication.  He is for updated labs as needed on arrival the day of surgery.   PROVIDERS: Celene Squibb, MD is listed as PCP. Vernell Leep, MD is cardiologist at [facility]. Last visit 09/06/17. Six month follow-up planned.  Nicolette Bang, MD is urologist.  LABS: He had labs in the ED on 08/29/17 during evaluation for neck pain with right arm numbness. Results included: Lab Results  Component Value Date   WBC 5.6 08/29/2017   HGB 12.0 (L) 08/29/2017   HCT 36.1 (L) 08/29/2017   PLT 177 08/29/2017   GLUCOSE 174 (H) 08/29/2017   NA 139 08/29/2017   K 4.2 08/29/2017   CL 102 08/29/2017   CREATININE 1.12 08/29/2017   BUN 19 08/29/2017   CO2 28 08/29/2017   TSH 2.807 09/05/2013   According to Dr. Bonney Roussel office note, patient's A1c was 6.9% 02/19/17. He will need a glucose or CBG on the day of surgery.    IMAGES: CT head/c-spine  08/29/17: IMPRESSION: - CT of the head: No acute intracranial abnormality noted. - CT of the cervical spine: Multilevel degenerative change without acute abnormality. (MRI of the brain and c-spine also ordered in the ED, but patient opted to out-patient follow-up. Now with neurosurgery procedure scheduled.)   EKG: 08/29/17: SR, right axis deviation. Abnormal inferior Q waves, probably insignificant based on size and overall, the small q waves appear stable when compared to 05/19/15 tracing. RAD is new.      CV: Exercise Myoview 04/30/17 [facility] CV):  1. The resting ECG demonstrated NSR, normal conduction, no resting arrhythmias, and normal rest repolarization. The stress ECG was norma. Patient exercised on Bruce protocol for 6: 23 minutes and achieved 6.23 METS.  Stress test terminated due to fatigue and 91% MPHR achieved (Target HR > 85%). Normal BP.  2.  Myocardial perfusion imaging is normal.  Overall left ventricular systolic function was normal without regional wall motion abnormalities.  The left ventricular ejection fraction was 73%.  This is a low risk stress.  Echo 04/29/17 [facility] CV): Left ventricle cavity is normal in size.  Mild concentric hypertrophy of the left ventricle.  Normal global wall motion.  Normal diastolic filling pattern.  Calculated EF 68%.  Left atrial cavity is mildly dilated.  Mild aortic valve leaflet calcification without significant aortic stenosis.  Moderate (grade 2) aortic regurgitation.  Mild (grade 1) mitral regurgitation.  Mild tricuspid regurgitation.  Estimated pulmonary artery systolic pressure 25 mmHg.  ABI 06/10/17 [facility] CV):  The exam reveals moderately decreased perfusion of the lower extremity, noted at the dorsalis pedis artery level (RABI 0.76 and LABI 0.71) with bisphasic waveform.   Past Medical History:  Diagnosis Date  . Cancer [facility])    prostate  . Cervical stenosis of spine   . GERD (gastroesophageal reflux disease)   . Glaucoma  (increased eye pressure)   . Wears glasses     Past Surgical History:  Procedure Laterality Date  . fracture back    . HERNIA REPAIR      MEDICATIONS: No current facility-administered medications for this encounter.    Marland Kitchen alfuzosin (UROXATRAL) 10 MG 24 hr tablet  . aspirin 325 MG tablet  . cilostazol (PLETAL) 50 MG tablet  . Multiple Vitamins-Minerals (MULTIVITAMIN WITH MINERALS) tablet  . rosuvastatin (CRESTOR) 20 MG tablet    [facility] Short Stay Center/Anesthesiology Phone [phone] 09/08/2017 4:09 PM

## 2017-09-08 NOTE — Progress Notes (Signed)
Pt denies SOB and chest pain.Pt under the care of Dr. Virgina Jock, Cardiology. Requested LOV note, echo and stress test from [facility]. Pt denies having a cardiac cath. Pt stated that last dose of Aspirin was Friday and last dose of Pletal was Sunday as instructed by MD. Pt made aware to stop taking vitamins, fish oil and herbal medications. Do not take any NSAIDs ie: Ibuprofen, Advil, Naproxen (Aleve), Motrin , BC and Goody Powder. Pt verbalized understanding of all pre-op instructions. Anesthesia asked to review pt history.
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
## 2017-09-10 ENCOUNTER — Encounter (HOSPITAL_COMMUNITY): Payer: Self-pay | Admitting: *Deleted

## 2017-09-10 ENCOUNTER — Inpatient Hospital Stay (HOSPITAL_COMMUNITY): Payer: Medicare HMO

## 2017-09-10 ENCOUNTER — Other Ambulatory Visit: Payer: Self-pay | Admitting: Neurosurgery

## 2017-09-10 ENCOUNTER — Inpatient Hospital Stay (HOSPITAL_COMMUNITY): Payer: Medicare HMO | Admitting: Vascular Surgery

## 2017-09-10 ENCOUNTER — Inpatient Hospital Stay (HOSPITAL_COMMUNITY)
Admission: RE | Admit: 2017-09-10 | Discharge: 2017-09-13 | DRG: 454 | Disposition: A | Discharge status: Home / Self Care | Payer: Medicare HMO | Attending: Neurosurgery | Admitting: Neurosurgery

## 2017-09-10 ENCOUNTER — Inpatient Hospital Stay (HOSPITAL_COMMUNITY)
Admission: RE | Disposition: A | Discharge status: Home / Self Care | Payer: Self-pay | Source: Home / Self Care | Attending: Neurosurgery

## 2017-09-10 DIAGNOSIS — H409 Unspecified glaucoma: Secondary | ICD-10-CM | POA: Diagnosis not present

## 2017-09-10 DIAGNOSIS — Z880 Allergy status to penicillin: Secondary | ICD-10-CM

## 2017-09-10 DIAGNOSIS — Z981 Arthrodesis status: Secondary | ICD-10-CM | POA: Diagnosis not present

## 2017-09-10 DIAGNOSIS — E1151 Type 2 diabetes mellitus with diabetic peripheral angiopathy without gangrene: Secondary | ICD-10-CM | POA: Diagnosis not present

## 2017-09-10 DIAGNOSIS — Z833 Family history of diabetes mellitus: Secondary | ICD-10-CM | POA: Diagnosis not present

## 2017-09-10 DIAGNOSIS — Z79899 Other long term (current) drug therapy: Secondary | ICD-10-CM | POA: Diagnosis not present

## 2017-09-10 DIAGNOSIS — R339 Retention of urine, unspecified: Secondary | ICD-10-CM | POA: Diagnosis not present

## 2017-09-10 DIAGNOSIS — M4802 Spinal stenosis, cervical region: Principal | ICD-10-CM | POA: Diagnosis present

## 2017-09-10 DIAGNOSIS — Z87891 Personal history of nicotine dependence: Secondary | ICD-10-CM

## 2017-09-10 DIAGNOSIS — Z885 Allergy status to narcotic agent status: Secondary | ICD-10-CM | POA: Diagnosis not present

## 2017-09-10 DIAGNOSIS — Z419 Encounter for procedure for purposes other than remedying health state, unspecified: Secondary | ICD-10-CM

## 2017-09-10 DIAGNOSIS — Z8042 Family history of malignant neoplasm of prostate: Secondary | ICD-10-CM

## 2017-09-10 DIAGNOSIS — G992 Myelopathy in diseases classified elsewhere: Secondary | ICD-10-CM | POA: Diagnosis present

## 2017-09-10 DIAGNOSIS — M542 Cervicalgia: Secondary | ICD-10-CM | POA: Diagnosis not present

## 2017-09-10 DIAGNOSIS — G959 Disease of spinal cord, unspecified: Secondary | ICD-10-CM | POA: Diagnosis not present

## 2017-09-10 DIAGNOSIS — Z8546 Personal history of malignant neoplasm of prostate: Secondary | ICD-10-CM

## 2017-09-10 DIAGNOSIS — K219 Gastro-esophageal reflux disease without esophagitis: Secondary | ICD-10-CM | POA: Diagnosis present

## 2017-09-10 HISTORY — DX: Presence of spectacles and contact lenses: Z97.3

## 2017-09-10 HISTORY — DX: Malignant (primary) neoplasm, unspecified: C80.1

## 2017-09-10 HISTORY — PX: POSTERIOR CERVICAL FUSION/FORAMINOTOMY: SHX5038

## 2017-09-10 HISTORY — DX: Spinal stenosis, cervical region: M48.02

## 2017-09-10 LAB — TYPE AND SCREEN
ABO/RH(D): A POS
Antibody Screen: NEGATIVE

## 2017-09-10 LAB — ABO/RH: ABO/RH(D): A POS

## 2017-09-10 SURGERY — POSTERIOR CERVICAL FUSION/FORAMINOTOMY LEVEL 3
Anesthesia: General | Site: Spine Cervical

## 2017-09-10 MED ORDER — DOCUSATE SODIUM 100 MG PO CAPS
100.0000 mg | ORAL_CAPSULE | Freq: Two times a day (BID) | ORAL | Status: DC
  Administered 2017-09-11 – 2017-09-13 (×5): 100 mg via ORAL
  Filled 2017-09-10 (×5): qty 1

## 2017-09-10 MED ORDER — THROMBIN 20000 UNITS EX SOLR
CUTANEOUS | Status: DC | PRN
  Administered 2017-09-10: 18:00:00 via TOPICAL

## 2017-09-10 MED ORDER — PROPOFOL 10 MG/ML IV BOLUS
INTRAVENOUS | Status: DC | PRN
  Administered 2017-09-10: 150 mg via INTRAVENOUS

## 2017-09-10 MED ORDER — BACITRACIN ZINC 500 UNIT/GM EX OINT
TOPICAL_OINTMENT | CUTANEOUS | Status: DC | PRN
  Administered 2017-09-10: 1 via TOPICAL

## 2017-09-10 MED ORDER — SODIUM CHLORIDE 0.9% FLUSH: 3.0000 mL | INTRAVENOUS | Status: DC | PRN

## 2017-09-10 MED ORDER — SUGAMMADEX SODIUM 200 MG/2ML IV SOLN
INTRAVENOUS | Status: DC | PRN
  Administered 2017-09-10: 200 mg via INTRAVENOUS

## 2017-09-10 MED ORDER — VANCOMYCIN HCL IN DEXTROSE 1-5 GM/200ML-% IV SOLN
1000.0000 mg | INTRAVENOUS | Status: AC
  Administered 2017-09-10: 1000 mg via INTRAVENOUS

## 2017-09-10 MED ORDER — PHENYLEPHRINE HCL 10 MG/ML IJ SOLN
INTRAMUSCULAR | Status: DC | PRN
  Administered 2017-09-10: 80 ug via INTRAVENOUS

## 2017-09-10 MED ORDER — ACETAMINOPHEN 500 MG PO TABS
1000.0000 mg | ORAL_TABLET | Freq: Four times a day (QID) | ORAL | Status: AC
  Administered 2017-09-11 (×3): 1000 mg via ORAL
  Filled 2017-09-10 (×3): qty 2

## 2017-09-10 MED ORDER — SODIUM CHLORIDE 0.9 % IV SOLN
INTRAVENOUS | Status: DC | PRN
  Administered 2017-09-10: 40 ug/min via INTRAVENOUS

## 2017-09-10 MED ORDER — PHENOL 1.4 % MT LIQD: 1.0000 | OROMUCOSAL | Status: DC | PRN

## 2017-09-10 MED ORDER — LACTATED RINGERS IV SOLN
INTRAVENOUS | Status: DC
  Administered 2017-09-10 (×3): via INTRAVENOUS

## 2017-09-10 MED ORDER — METHOCARBAMOL 1000 MG/10ML IJ SOLN
500.0000 mg | Freq: Four times a day (QID) | INTRAVENOUS | Status: DC | PRN
  Filled 2017-09-10: qty 5

## 2017-09-10 MED ORDER — FENTANYL CITRATE (PF) 100 MCG/2ML IJ SOLN
25.0000 ug | INTRAMUSCULAR | Status: DC | PRN
  Administered 2017-09-10: 50 ug via INTRAVENOUS
  Administered 2017-09-10: 25 ug via INTRAVENOUS

## 2017-09-10 MED ORDER — TRAMADOL HCL 50 MG PO TABS
50.0000 mg | ORAL_TABLET | Freq: Four times a day (QID) | ORAL | Status: DC | PRN
  Administered 2017-09-10 – 2017-09-11 (×2): 100 mg via ORAL
  Administered 2017-09-13: 50 mg via ORAL
  Filled 2017-09-10: qty 2
  Filled 2017-09-10: qty 1

## 2017-09-10 MED ORDER — MENTHOL 3 MG MT LOZG: 1.0000 | LOZENGE | OROMUCOSAL | Status: DC | PRN

## 2017-09-10 MED ORDER — FENTANYL CITRATE (PF) 100 MCG/2ML IJ SOLN
INTRAMUSCULAR | Status: DC | PRN
  Administered 2017-09-10: 50 ug via INTRAVENOUS
  Administered 2017-09-10: 100 ug via INTRAVENOUS
  Administered 2017-09-10: 50 ug via INTRAVENOUS
  Administered 2017-09-10: 100 ug via INTRAVENOUS
  Administered 2017-09-10: 50 ug via INTRAVENOUS
  Administered 2017-09-10: 150 ug via INTRAVENOUS
  Administered 2017-09-10: 100 ug via INTRAVENOUS

## 2017-09-10 MED ORDER — SODIUM CHLORIDE 0.9 % IV SOLN
INTRAVENOUS | Status: DC
  Administered 2017-09-10: 23:00:00 via INTRAVENOUS

## 2017-09-10 MED ORDER — SODIUM CHLORIDE 0.9 % IV SOLN
INTRAVENOUS | Status: DC | PRN
  Administered 2017-09-10: 18:00:00

## 2017-09-10 MED ORDER — PROPOFOL 10 MG/ML IV BOLUS
INTRAVENOUS | Status: AC
  Filled 2017-09-10: qty 20

## 2017-09-10 MED ORDER — PHENYLEPHRINE 40 MCG/ML (10ML) SYRINGE FOR IV PUSH (FOR BLOOD PRESSURE SUPPORT)
PREFILLED_SYRINGE | INTRAVENOUS | Status: AC
  Filled 2017-09-10: qty 10

## 2017-09-10 MED ORDER — GABAPENTIN 300 MG PO CAPS
300.0000 mg | ORAL_CAPSULE | Freq: Three times a day (TID) | ORAL | Status: DC
  Administered 2017-09-11 – 2017-09-13 (×8): 300 mg via ORAL
  Filled 2017-09-10 (×8): qty 1

## 2017-09-10 MED ORDER — METHOCARBAMOL 500 MG PO TABS
500.0000 mg | ORAL_TABLET | Freq: Four times a day (QID) | ORAL | Status: DC | PRN
  Administered 2017-09-11 – 2017-09-13 (×4): 500 mg via ORAL
  Filled 2017-09-10 (×4): qty 1

## 2017-09-10 MED ORDER — HEMOSTATIC AGENTS (NO CHARGE) OPTIME
TOPICAL | Status: DC | PRN
  Administered 2017-09-10: 1 via TOPICAL

## 2017-09-10 MED ORDER — HYDROCODONE-ACETAMINOPHEN 5-325 MG PO TABS
1.0000 | ORAL_TABLET | ORAL | Status: DC | PRN
  Administered 2017-09-11 – 2017-09-12 (×6): 1 via ORAL
  Filled 2017-09-10 (×7): qty 1

## 2017-09-10 MED ORDER — SODIUM CHLORIDE 0.9% FLUSH
3.0000 mL | Freq: Two times a day (BID) | INTRAVENOUS | Status: DC
  Administered 2017-09-11 – 2017-09-12 (×3): 3 mL via INTRAVENOUS

## 2017-09-10 MED ORDER — FENTANYL CITRATE (PF) 250 MCG/5ML IJ SOLN
INTRAMUSCULAR | Status: AC
  Filled 2017-09-10: qty 5

## 2017-09-10 MED ORDER — ZOLPIDEM TARTRATE 5 MG PO TABS
5.0000 mg | ORAL_TABLET | Freq: Every evening | ORAL | Status: DC | PRN
  Administered 2017-09-11: 5 mg via ORAL
  Filled 2017-09-10 (×2): qty 1

## 2017-09-10 MED ORDER — HYDROMORPHONE HCL 1 MG/ML IJ SOLN
0.5000 mg | INTRAMUSCULAR | Status: DC | PRN
  Administered 2017-09-11: 1 mg via INTRAVENOUS
  Filled 2017-09-10: qty 1

## 2017-09-10 MED ORDER — DEXAMETHASONE SODIUM PHOSPHATE 4 MG/ML IJ SOLN
INTRAMUSCULAR | Status: DC | PRN
  Administered 2017-09-10: 10 mg via INTRAVENOUS

## 2017-09-10 MED ORDER — BISACODYL 10 MG RE SUPP: 10.0000 mg | Freq: Every day | RECTAL | Status: DC | PRN

## 2017-09-10 MED ORDER — SODIUM CHLORIDE 0.9 % IV SOLN: 250.0000 mL | INTRAVENOUS | Status: DC

## 2017-09-10 MED ORDER — ACETAMINOPHEN 325 MG PO TABS
650.0000 mg | ORAL_TABLET | ORAL | Status: DC | PRN
  Administered 2017-09-11: 650 mg via ORAL
  Filled 2017-09-10: qty 2

## 2017-09-10 MED ORDER — LIDOCAINE-EPINEPHRINE 1 %-1:100000 IJ SOLN
INTRAMUSCULAR | Status: AC
  Filled 2017-09-10: qty 1

## 2017-09-10 MED ORDER — SENNA 8.6 MG PO TABS
1.0000 | ORAL_TABLET | Freq: Two times a day (BID) | ORAL | Status: DC
  Administered 2017-09-11 – 2017-09-13 (×5): 8.6 mg via ORAL
  Filled 2017-09-10 (×5): qty 1

## 2017-09-10 MED ORDER — BACITRACIN ZINC 500 UNIT/GM EX OINT
TOPICAL_OINTMENT | CUTANEOUS | Status: AC
  Filled 2017-09-10: qty 28.35

## 2017-09-10 MED ORDER — 0.9 % SODIUM CHLORIDE (POUR BTL) OPTIME
TOPICAL | Status: DC | PRN
  Administered 2017-09-10: 1000 mL

## 2017-09-10 MED ORDER — LABETALOL HCL 5 MG/ML IV SOLN
5.0000 mg | INTRAVENOUS | Status: AC | PRN
  Administered 2017-09-10 (×4): 5 mg via INTRAVENOUS

## 2017-09-10 MED ORDER — ONDANSETRON HCL 4 MG/2ML IJ SOLN: 4.0000 mg | Freq: Four times a day (QID) | INTRAMUSCULAR | Status: DC | PRN

## 2017-09-10 MED ORDER — THROMBIN (RECOMBINANT) 20000 UNITS EX SOLR
CUTANEOUS | Status: AC
  Filled 2017-09-10: qty 20000

## 2017-09-10 MED ORDER — ACETAMINOPHEN 650 MG RE SUPP: 650.0000 mg | RECTAL | Status: DC | PRN

## 2017-09-10 MED ORDER — LIDOCAINE-EPINEPHRINE 1 %-1:100000 IJ SOLN
INTRAMUSCULAR | Status: DC | PRN
  Administered 2017-09-10: 10 mL

## 2017-09-10 MED ORDER — DEXAMETHASONE SODIUM PHOSPHATE 10 MG/ML IJ SOLN
INTRAMUSCULAR | Status: AC
  Filled 2017-09-10: qty 1

## 2017-09-10 MED ORDER — CHLORHEXIDINE GLUCONATE CLOTH 2 % EX PADS: 6.0000 | MEDICATED_PAD | Freq: Once | CUTANEOUS | Status: DC

## 2017-09-10 MED ORDER — ROCURONIUM BROMIDE 100 MG/10ML IV SOLN
INTRAVENOUS | Status: DC | PRN
  Administered 2017-09-10: 20 mg via INTRAVENOUS
  Administered 2017-09-10 (×2): 50 mg via INTRAVENOUS

## 2017-09-10 MED ORDER — BUPIVACAINE HCL (PF) 0.5 % IJ SOLN
INTRAMUSCULAR | Status: AC
  Filled 2017-09-10: qty 30

## 2017-09-10 MED ORDER — SENNOSIDES-DOCUSATE SODIUM 8.6-50 MG PO TABS: 1.0000 | ORAL_TABLET | Freq: Every evening | ORAL | Status: DC | PRN

## 2017-09-10 MED ORDER — CEFAZOLIN SODIUM-DEXTROSE 2-4 GM/100ML-% IV SOLN
2.0000 g | Freq: Three times a day (TID) | INTRAVENOUS | Status: AC
  Administered 2017-09-11 (×2): 2 g via INTRAVENOUS
  Filled 2017-09-10 (×2): qty 100

## 2017-09-10 MED ORDER — VANCOMYCIN HCL IN DEXTROSE 1-5 GM/200ML-% IV SOLN
INTRAVENOUS | Status: AC
  Administered 2017-09-10: 1000 mg via INTRAVENOUS
  Filled 2017-09-10: qty 200

## 2017-09-10 MED ORDER — SUGAMMADEX SODIUM 500 MG/5ML IV SOLN
INTRAVENOUS | Status: AC
  Filled 2017-09-10: qty 5

## 2017-09-10 MED ORDER — EPHEDRINE SULFATE 50 MG/ML IJ SOLN
INTRAMUSCULAR | Status: DC | PRN
  Administered 2017-09-10: 5 mg via INTRAVENOUS

## 2017-09-10 MED ORDER — ONDANSETRON HCL 4 MG/2ML IJ SOLN
4.0000 mg | Freq: Four times a day (QID) | INTRAMUSCULAR | Status: DC | PRN
  Administered 2017-09-11: 4 mg via INTRAVENOUS
  Filled 2017-09-10: qty 2

## 2017-09-10 MED ORDER — LIDOCAINE HCL (CARDIAC) PF 100 MG/5ML IV SOSY
PREFILLED_SYRINGE | INTRAVENOUS | Status: DC | PRN
  Administered 2017-09-10: 60 mg via INTRAVENOUS

## 2017-09-10 MED ORDER — ONDANSETRON HCL 4 MG PO TABS: 4.0000 mg | ORAL_TABLET | Freq: Four times a day (QID) | ORAL | Status: DC | PRN

## 2017-09-10 MED ORDER — EPHEDRINE 5 MG/ML INJ
INTRAVENOUS | Status: AC
  Filled 2017-09-10: qty 20

## 2017-09-10 MED ORDER — OXYCODONE HCL 5 MG PO TABS
5.0000 mg | ORAL_TABLET | ORAL | Status: DC | PRN
  Administered 2017-09-13: 10 mg via ORAL
  Filled 2017-09-10: qty 1
  Filled 2017-09-10: qty 2

## 2017-09-10 MED ORDER — FENTANYL CITRATE (PF) 100 MCG/2ML IJ SOLN
INTRAMUSCULAR | Status: AC
  Filled 2017-09-10: qty 2

## 2017-09-10 MED ORDER — TRAMADOL HCL 50 MG PO TABS
ORAL_TABLET | ORAL | Status: AC
  Filled 2017-09-10: qty 2

## 2017-09-10 MED ORDER — FLEET ENEMA 7-19 GM/118ML RE ENEM: 1.0000 | ENEMA | Freq: Once | RECTAL | Status: DC | PRN

## 2017-09-10 MED ORDER — LABETALOL HCL 5 MG/ML IV SOLN
INTRAVENOUS | Status: AC
  Administered 2017-09-10: 5 mg via INTRAVENOUS
  Filled 2017-09-10: qty 4

## 2017-09-10 SURGICAL SUPPLY — 64 items
BAG DECANTER FOR FLEXI CONT (MISCELLANEOUS) ×2 IMPLANT
BENZOIN TINCTURE PRP APPL 2/3 (GAUZE/BANDAGES/DRESSINGS) IMPLANT
BIT DRILL 2.4 (BIT) ×2 IMPLANT
BLADE CLIPPER SURG (BLADE) ×2 IMPLANT
BLADE ULTRA TIP 2M (BLADE) IMPLANT
BUR MATCHSTICK NEURO 3.0 LAGG (BURR) ×2 IMPLANT
BUR PRECISION FLUTE 5.0 (BURR) ×2 IMPLANT
CANISTER SUCT 3000ML PPV (MISCELLANEOUS) ×2 IMPLANT
CARTRIDGE OIL MAESTRO DRILL (MISCELLANEOUS) ×1 IMPLANT
DECANTER SPIKE VIAL GLASS SM (MISCELLANEOUS) ×2 IMPLANT
DIFFUSER DRILL AIR PNEUMATIC (MISCELLANEOUS) ×2 IMPLANT
DRAPE C-ARM 42X72 X-RAY (DRAPES) ×4 IMPLANT
DRAPE LAPAROTOMY 100X72 PEDS (DRAPES) ×2 IMPLANT
DRSG OPSITE POSTOP 4X8 (GAUZE/BANDAGES/DRESSINGS) ×2 IMPLANT
DURAPREP 6ML APPLICATOR 50/CS (WOUND CARE) ×2 IMPLANT
ELECT REM PT RETURN 9FT ADLT (ELECTROSURGICAL) ×2
ELECTRODE REM PT RTRN 9FT ADLT (ELECTROSURGICAL) ×1 IMPLANT
FLOSEAL 5ML (HEMOSTASIS) ×2 IMPLANT
GAUZE 4X4 16PLY RFD (DISPOSABLE) IMPLANT
GAUZE SPONGE 4X4 12PLY STRL (GAUZE/BANDAGES/DRESSINGS) ×2 IMPLANT
GLOVE BIO SURGEON STRL SZ 6.5 (GLOVE) ×6 IMPLANT
GLOVE BIO SURGEON STRL SZ7 (GLOVE) IMPLANT
GLOVE BIO SURGEON STRL SZ7.5 (GLOVE) ×2 IMPLANT
GLOVE BIOGEL PI IND STRL 6.5 (GLOVE) ×3 IMPLANT
GLOVE BIOGEL PI IND STRL 7.0 (GLOVE) IMPLANT
GLOVE BIOGEL PI IND STRL 7.5 (GLOVE) ×3 IMPLANT
GLOVE BIOGEL PI INDICATOR 6.5 (GLOVE) ×3
GLOVE BIOGEL PI INDICATOR 7.0 (GLOVE)
GLOVE BIOGEL PI INDICATOR 7.5 (GLOVE) ×3
GLOVE ECLIPSE 7.0 STRL STRAW (GLOVE) ×2 IMPLANT
GLOVE EXAM NITRILE LRG STRL (GLOVE) IMPLANT
GLOVE EXAM NITRILE XL STR (GLOVE) IMPLANT
GLOVE EXAM NITRILE XS STR PU (GLOVE) IMPLANT
GLOVE SURG SS PI 6.0 STRL IVOR (GLOVE) ×10 IMPLANT
GOWN STRL REUS W/ TWL LRG LVL3 (GOWN DISPOSABLE) ×4 IMPLANT
GOWN STRL REUS W/ TWL XL LVL3 (GOWN DISPOSABLE) ×1 IMPLANT
GOWN STRL REUS W/TWL 2XL LVL3 (GOWN DISPOSABLE) IMPLANT
GOWN STRL REUS W/TWL LRG LVL3 (GOWN DISPOSABLE) ×4
GOWN STRL REUS W/TWL XL LVL3 (GOWN DISPOSABLE) ×1
KIT BASIN OR (CUSTOM PROCEDURE TRAY) ×2 IMPLANT
KIT TURNOVER KIT B (KITS) ×2 IMPLANT
NEEDLE HYPO 22GX1.5 SAFETY (NEEDLE) ×2 IMPLANT
NS IRRIG 1000ML POUR BTL (IV SOLUTION) ×2 IMPLANT
OIL CARTRIDGE MAESTRO DRILL (MISCELLANEOUS) ×2
PACK LAMINECTOMY NEURO (CUSTOM PROCEDURE TRAY) ×2 IMPLANT
PAD ARMBOARD 7.5X6 YLW CONV (MISCELLANEOUS) ×6 IMPLANT
PIN MAYFIELD SKULL DISP (PIN) ×2 IMPLANT
ROD PRECUT 3.5X60 (Rod) ×4 IMPLANT
SCREW MULTI AXIAL 3.5X14MM (Screw) ×16 IMPLANT
SCREW SET 3600315 STANDARD (Screw) ×16 IMPLANT
SCREW SET 3600315 STD (Screw) ×8 IMPLANT
SPONGE LAP 4X18 RFD (DISPOSABLE) IMPLANT
SPONGE SURGIFOAM ABS GEL 100 (HEMOSTASIS) ×2 IMPLANT
STAPLER VISISTAT 35W (STAPLE) ×2 IMPLANT
STRIP CLOSURE SKIN 1/2X4 (GAUZE/BANDAGES/DRESSINGS) IMPLANT
SUT ETHILON 3 0 FSL (SUTURE) IMPLANT
SUT VIC AB 0 CT1 18XCR BRD8 (SUTURE) ×3 IMPLANT
SUT VIC AB 0 CT1 8-18 (SUTURE) ×3
SUT VIC AB 2-0 CT1 18 (SUTURE) IMPLANT
SUT VICRYL 3-0 RB1 18 ABS (SUTURE) ×2 IMPLANT
TOWEL GREEN STERILE (TOWEL DISPOSABLE) ×2 IMPLANT
TOWEL GREEN STERILE FF (TOWEL DISPOSABLE) ×2 IMPLANT
UNDERPAD 30X30 (UNDERPADS AND DIAPERS) ×2 IMPLANT
WATER STERILE IRR 1000ML POUR (IV SOLUTION) ×2 IMPLANT

## 2017-09-10 NOTE — Progress Notes (Signed)
Pt oriented to time and place but restless and not back to baseline behavior. bp remains elevated despite meds and continues to have oozing from incision. Dr. Conrad [place] at bedside. Feels pt needs upgraded to stepdown. Dr. Ralene Ok notified and updated. Orders received.

## 2017-09-10 NOTE — Transfer of Care (Signed)
Immediate Anesthesia Transfer of Care Note  Patient: Grant [name] [facility].  Procedure(s) Performed: LAMINECTOMY CERVICAL FOUR - CERVICAL FIVE, CERVICAL FIVE - CERVICAL SIX, CERVICAL SIX - CERVICAL SEVEN, POSTERIOR SEGMENTAL INSTRUMENTATION, POSTERIOR LATERAL ARTHRODESIS (N/A Spine Cervical)  Patient Location: PACU  Anesthesia Type:General  Level of Consciousness: pateint uncooperative  Airway & Oxygen Therapy: Patient Spontanous Breathing and Patient connected to nasal cannula oxygen  Post-op Assessment: Report given to RN and Post -op Vital signs reviewed and stable  Post vital signs: Reviewed and stable  Last Vitals:  Vitals Value Taken Time  BP 204/66 09/10/2017  8:11 PM  Temp    Pulse 93 09/10/2017  8:11 PM  Resp 10 09/10/2017  8:11 PM  SpO2 87 % 09/10/2017  8:11 PM  Vitals shown include unvalidated device data.  Last Pain:  Vitals:   09/10/17 1254  PainSc: 2       Patients Stated Pain Goal: 4 (27/12/92 9090)  Complications: No apparent anesthesia complications

## 2017-09-10 NOTE — Anesthesia Postprocedure Evaluation (Signed)
Anesthesia Post Note  Patient: Rochell Mabie [facility].  Procedure(s) Performed: LAMINECTOMY CERVICAL FOUR - CERVICAL FIVE, CERVICAL FIVE - CERVICAL SIX, CERVICAL SIX - CERVICAL SEVEN, POSTERIOR SEGMENTAL INSTRUMENTATION, POSTERIOR LATERAL ARTHRODESIS (N/A Spine Cervical)     Patient location during evaluation: PACU Anesthesia Type: General Level of consciousness: awake and alert Pain management: pain level controlled Vital Signs Assessment: post-procedure vital signs reviewed and stable Respiratory status: spontaneous breathing, nonlabored ventilation, respiratory function stable and patient connected to nasal cannula oxygen Cardiovascular status: blood pressure returned to baseline and stable Postop Assessment: no apparent nausea or vomiting Anesthetic complications: no    Last Vitals:  Vitals:   09/10/17 2200 09/10/17 2207  BP: (!) 204/92 (!) 180/82  Pulse: 86 90  Resp: 12 15  Temp:    SpO2: 95% 92%    Last Pain:  Vitals:   09/10/17 2130  PainSc: 5                  Jahmal Dunavant DAVID

## 2017-09-10 NOTE — Brief Op Note (Signed)
09/10/2017  7:52 PM  PATIENT:  [place] y.o. male  PRE-OPERATIVE DIAGNOSIS:  CERVICAL STENOSIS OF SPINE  POST-OPERATIVE DIAGNOSIS:  CERVICAL STENOSIS OF SPINE  PROCEDURE:  Procedure(s) with comments: LAMINECTOMY CERVICAL FOUR - CERVICAL FIVE, CERVICAL FIVE - CERVICAL SIX, CERVICAL SIX - CERVICAL SEVEN, POSTERIOR SEGMENTAL INSTRUMENTATION, POSTERIOR LATERAL ARTHRODESIS (N/A) - LAMINECTOMY CERVICAL FOUR - CERVICAL FIVE, CERVICAL FIVE - CERVICAL SIX, CERVICAL SIX - CERVICAL SEVEN, POSTERIOR SEGMENTAL INSTRUMENTATION, POSTERIOR LATERAL ARTHRODESIS  SURGEON:  Surgeon(s) and Role:    * Consuella Lose, MD - Primary  PHYSICIAN ASSISTANT: Ferne Reus, PA-C  ANESTHESIA:   general  EBL:     BLOOD ADMINISTERED:none  DRAINS: none   LOCAL MEDICATIONS USED:  MARCAINE    and LIDOCAINE   SPECIMEN:  No Specimen  DISPOSITION OF SPECIMEN:  N/A  COUNTS:  YES  TOURNIQUET:  * No tourniquets in log *  DICTATION: .Note written in EPIC  PLAN OF CARE: Admit to inpatient   PATIENT DISPOSITION:  PACU - hemodynamically stable.   Delay start of Pharmacological VTE agent (>24hrs) due to surgical blood loss or risk of bleeding: yes

## 2017-09-10 NOTE — Anesthesia Preprocedure Evaluation (Signed)
Anesthesia Evaluation  Patient identified by MRN, date of birth, ID band Patient awake    Reviewed: Allergy & Precautions, H&P , NPO status , Patient's Chart, lab work & pertinent test results  Airway Mallampati: II   Neck ROM: full    Dental   Pulmonary former smoker,    breath sounds clear to auscultation       Cardiovascular negative cardio ROS   Rhythm:regular Rate:Normal     Neuro/Psych    GI/Hepatic GERD  ,  Endo/Other    Renal/GU    Prostate CA    Musculoskeletal   Abdominal   Peds  Hematology   Anesthesia Other Findings   Reproductive/Obstetrics                             Anesthesia Physical Anesthesia Plan  ASA: II  Anesthesia Plan: General   Post-op Pain Management:    Induction: Intravenous  PONV Risk Score and Plan: 2  Airway Management Planned: Oral ETT  Additional Equipment:   Intra-op Plan:   Post-operative Plan: Extubation in OR  Informed Consent: I have reviewed the patients History and Physical, chart, labs and discussed the procedure including the risks, benefits and alternatives for the proposed anesthesia with the patient or authorized representative who has indicated his/her understanding and acceptance.     Plan Discussed with: CRNA, Anesthesiologist and Surgeon  Anesthesia Plan Comments:         Anesthesia Quick Evaluation

## 2017-09-11 LAB — BASIC METABOLIC PANEL
Anion gap: 12 (ref 5–15)
BUN: 23 mg/dL (ref 8–23)
CO2: 25 mmol/L (ref 22–32)
Calcium: 8.6 mg/dL — ABNORMAL LOW (ref 8.9–10.3)
Chloride: 96 mmol/L — ABNORMAL LOW (ref 98–111)
Creatinine, Ser: 1.08 mg/dL (ref 0.61–1.24)
GFR calc Af Amer: >60 mL/min (ref >60)
GFR calc non Af Amer: >60 mL/min (ref >60)
Glucose, Bld: 367 mg/dL — ABNORMAL HIGH (ref 70–99)
Potassium: 4.4 mmol/L (ref 3.5–5.1)
Sodium: 133 mmol/L — ABNORMAL LOW (ref 135–145)

## 2017-09-11 LAB — CBC
HCT: 37.8 % — ABNORMAL LOW (ref 39.0–52.0)
Hemoglobin: 12.3 g/dL — ABNORMAL LOW (ref 13.0–17.0)
MCH: 28.5 pg (ref 26.0–34.0)
MCHC: 32.5 g/dL (ref 30.0–36.0)
MCV: 87.7 fL (ref 78.0–100.0)
Platelets: 158 10*3/uL (ref 150–400)
RBC: 4.31 MIL/uL (ref 4.22–5.81)
RDW: 13.2 % (ref 11.5–15.5)
WBC: 10.7 10*3/uL — ABNORMAL HIGH (ref 4.0–10.5)

## 2017-09-11 LAB — PROTIME-INR
INR: 1.07
Prothrombin Time: 13.8 s (ref 11.4–15.2)

## 2017-09-11 LAB — APTT: aPTT: 27 seconds (ref 24–36)

## 2017-09-11 MED ORDER — TAMSULOSIN HCL 0.4 MG PO CAPS
0.4000 mg | ORAL_CAPSULE | Freq: Every day | ORAL | Status: DC
  Administered 2017-09-11 – 2017-09-13 (×3): 0.4 mg via ORAL
  Filled 2017-09-11 (×3): qty 1

## 2017-09-11 MED ORDER — PANTOPRAZOLE SODIUM 20 MG PO TBEC
20.0000 mg | DELAYED_RELEASE_TABLET | Freq: Every day | ORAL | Status: DC
  Administered 2017-09-11 – 2017-09-13 (×3): 20 mg via ORAL
  Filled 2017-09-11 (×3): qty 1

## 2017-09-11 MED ORDER — CALCIUM CARBONATE ANTACID 500 MG PO CHEW
1.0000 | CHEWABLE_TABLET | Freq: Two times a day (BID) | ORAL | Status: DC
  Administered 2017-09-11 – 2017-09-12 (×2): 200 mg via ORAL
  Filled 2017-09-11 (×4): qty 1

## 2017-09-11 NOTE — Progress Notes (Signed)
Pt came to floor without C-collar on. Honey come on posterior neck was saturated. Removed and placed pressured dressing at 0000. C-collar ordered. C-collar arrived on unit at 0200 and was placed on pt after placing new pressure dressing. Pt not compliant with wearing, he takes it off himself. Pt has been educated by this nurse as well as day shift nurse of the importance of wearing c-collar. HE verbalizes understanding. Stillman Buenger, 09/11/2017, 9:02 AM

## 2017-09-11 NOTE — Evaluation (Signed)
Occupational Therapy Evaluation Patient Details Name: Grant [name]. MRN: [id number] DOB: 1945-03-14 Today's Date: 09/11/2017    History of Present Illness 72 y.o. male admitted on 09/10/17 for elective C4-7 posterior decompression and fusion.  Pt with significant PMH of back fracture and glaucoma.     Clinical Impression   PTA, pt was independent with ADL and functional mobility although he reports very minimal occasional use of cane. He currently requires guarding assist for LB ADL and toilet transfers and cues to adhere to cervical precautions. He is limited by decreased functional extension and sensation in R ulnar nerve distribution. Initiated HEP for AAROM in digit extension. Educated pt concerning cervical collar wearing schedule and method to don/doff for showering only. He was educated concerning cervical precautions related to ADL participation as well as strategies to adhere to these during daily tasks. Pt and wife report understanding but pt requiring cues for safety awareness and was slightly impulsive during session. He would benefit from continued OT services to improve functional use of R hand as well as to improve independence and safety with ADL and functional mobility.     Follow Up Recommendations  No OT follow up;Supervision/Assistance - 24 hour    Equipment Recommendations  3 in 1 bedside commode    Recommendations for Other Services       Precautions / Restrictions Precautions Precautions: Fall;Cervical Precaution Booklet Issued: Yes (comment) Precaution Comments: Pt educated concerning need to wear cervical collar (it was half off on my arrival). Reviewed cervical precautions as well as handout.  Required Braces or Orthoses: Cervical Brace Cervical Brace: Hard collar;At all times(except in shower) Restrictions Weight Bearing Restrictions: No      Mobility Bed Mobility               General bed mobility comments: OOB in recliner on my arrival.    Transfers Overall transfer level: Needs assistance Equipment used: None Transfers: Sit to/from Stand Sit to Stand: Min assist         General transfer comment: Min assist to power up to standing.     Balance Overall balance assessment: Needs assistance Sitting-balance support: Feet supported;No upper extremity supported Sitting balance-Leahy Scale: Good     Standing balance support: No upper extremity supported;During functional activity Standing balance-Leahy Scale: Fair Standing balance comment: Guarding assist in standing.                            ADL either performed or assessed with clinical judgement   ADL Overall ADL's : Needs assistance/impaired Eating/Feeding: Sitting;Minimal assistance Eating/Feeding Details (indicate cue type and reason): would benefit from tubing to build up handles Grooming: Supervision/safety;Sitting   Upper Body Bathing: Sitting;Supervision/ safety   Lower Body Bathing: Minimal assistance;Sit to/from stand   Upper Body Dressing : Supervision/safety;Sitting   Lower Body Dressing: Minimal assistance;Sit to/from stand   Toilet Transfer: Min guard;Ambulation;Minimal assistance Toilet Transfer Details (indicate cue type and reason): Min assist to power up to standing; min guard to ambulate into bathroom; min guard to stand to urinate Toileting- Water quality scientist and Hygiene: Min guard;Sit to/from stand       Functional mobility during ADLs: Minimal assistance;Min guard General ADL Comments: Pt and family educated concerning cervical precautions, brace wear schedule, and compensatory strategies. Pt with cervical collar not fastened on my arrival due to RN changing dressing prior to my arrival. Educated concerning need to wear this at all times unless in the shower.  Educated wife concerning method to apply.      Vision Patient Visual Report: No change from baseline Vision Assessment?: No apparent visual deficits      Perception     Praxis      Pertinent Vitals/Pain Pain Assessment: Faces Faces Pain Scale: Hurts little more Pain Location: posterior neck Pain Descriptors / Indicators: Aching Pain Intervention(s): Limited activity within patient's tolerance;Monitored during session;Repositioned     Hand Dominance Right   Extremity/Trunk Assessment Upper Extremity Assessment Upper Extremity Assessment: RUE deficits/detail RUE Deficits / Details: Full composite fist present but only 50% composite extension. Numbness and cool to touch in digits III-V. Reports pain and upper arm numbness is resolved.    Lower Extremity Assessment Lower Extremity Assessment: Defer to PT evaluation   Cervical / Trunk Assessment Cervical / Trunk Assessment: Other exceptions Cervical / Trunk Exceptions: s/p cervical spine surgery   Communication Communication Communication: No difficulties   Cognition Arousal/Alertness: Lethargic;Suspect due to medications(falling asleep at times) Behavior During Therapy: Impulsive Overall Cognitive Status: Impaired/Different from baseline Area of Impairment: Attention;Safety/judgement                   Current Attention Level: Selective     Safety/Judgement: Decreased awareness of safety     General Comments: Pt with impulsivity and limited safety awareness.    General Comments  Multiple family members present    Exercises Exercises: Other exercises Other Exercises Other Exercises: Educated concerning AAROM/PROM with stretch to R digits to maximize functional extension.    Shoulder Instructions      Home Living Family/patient expects to be discharged to:: Private residence Living Arrangements: Spouse/significant other Available Help at Discharge: Family;Available 24 hours/day Type of Home: House Home Access: Stairs to enter [employer] of Steps: 3 Entrance Stairs-Rails: Right;Left;Can reach both Home Layout: One level     Bathroom  Shower/Tub: Occupational psychologist: Standard     Home Equipment: Cane - single point          Prior Functioning/Environment Level of Independence: Independent        Comments: Limited use of RUE due to numbness and decreased extension. Independent with "frequent stumbles" and occasionally uses cane but not often.         OT Problem List: Decreased strength;Decreased range of motion;Decreased activity tolerance;Impaired balance (sitting and/or standing);Decreased safety awareness;Decreased cognition;Decreased knowledge of use of DME or AE;Decreased knowledge of precautions;Impaired sensation;Pain;Impaired UE functional use      OT Treatment/Interventions: Self-care/ADL training;Therapeutic exercise;Energy conservation;DME and/or AE instruction;Therapeutic activities;Patient/family education;Balance training    OT Goals(Current goals can be found in the care plan section) Acute Rehab OT Goals Patient Stated Goal: to go home soon OT Goal Formulation: With patient Time For Goal Achievement: 09/25/17 Potential to Achieve Goals: Good ADL Goals Pt Will Perform Grooming: with modified independence;standing Pt Will Perform Lower Body Dressing: with modified independence;sit to/from stand Pt Will Transfer to Toilet: with modified independence;ambulating;regular height toilet;bedside commode(BSC over toilet) Pt Will Perform Toileting - Clothing Manipulation and hygiene: with modified independence;sit to/from stand Pt Will Perform Tub/Shower Transfer: with modified independence;rolling walker;3 in 1;ambulating  OT Frequency: Min 2X/week   Barriers to D/C:            Co-evaluation              AM-PAC PT "6 Clicks" Daily Activity     Outcome Measure Help from another person eating meals?: A Little Help from another person taking care of personal grooming?:  A Little Help from another person toileting, which includes using toliet, bedpan, or urinal?: A Little Help  from another person bathing (including washing, rinsing, drying)?: A Little Help from another person to put on and taking off regular upper body clothing?: A Little Help from another person to put on and taking off regular lower body clothing?: A Little 6 Click Score: 18   End of Session Equipment Utilized During Treatment: Gait belt Nurse Communication: Mobility status  Activity Tolerance: Patient tolerated treatment well Patient left: in chair;with call bell/phone within reach;with family/visitor present  OT Visit Diagnosis: Other abnormalities of gait and mobility (R26.89);Pain;Muscle weakness (generalized) (M62.81) Pain - Right/Left: Right Pain - part of body: Hand(numbness more than pain)                Time: 1436-1500 OT Time Calculation (min): 24 min Charges:  OT General Charges $OT Visit: 1 Visit OT Evaluation $OT Eval Moderate Complexity: 1 Mod OT Treatments $Self Care/Home Management : 8-22 mins  Norman Herrlich, MS OTR/L  Pager: Grant [name] 09/11/2017, 4:19 PM

## 2017-09-11 NOTE — Evaluation (Signed)
Physical Therapy Evaluation Patient Details Name: Grant [name]. MRN: [id number] DOB: 07-21-1945 Today's Date: 09/11/2017   History of Present Illness  72 y.o. male admitted on 09/10/17 for elective C4-7 posterior decompression and fusion.  Pt with significant PMH of back fracture and glaucoma.    Clinical Impression  Pt was found with his cervical collar undone despite multiple education by the RN to keep it donned.  Pt, at baseline, is unsteady on his feet and he continues to be at this time as well. Pt would benefit from OP PT for balance and gait training when MD deems appropriate.    Follow Up Recommendations Outpatient PT;Supervision for mobility/OOB    Equipment Recommendations  None recommended by PT    Recommendations for Other Services   NA     Precautions / Restrictions Precautions Precautions: Fall;Cervical Precaution Booklet Issued: Yes (comment) Precaution Comments: Cervical spine handout given and reviewed as well as c-collar use (it was half off when PT entered the room).  Required Braces or Orthoses: Cervical Brace Cervical Brace: Hard collar;At all times      Mobility  Bed Mobility Overal bed mobility: Needs Assistance Bed Mobility: Sit to Supine       Sit to supine: Supervision;HOB elevated   General bed mobility comments: supervision for pt to return to supine.  HOB elevated.   Transfers Overall transfer level: Needs assistance   Transfers: Sit to/from Stand Sit to Stand: Min guard         General transfer comment: min guard for safety and balance.   Ambulation/Gait Ambulation/Gait assistance: Min assist Gait Distance (Feet): 130 Feet Assistive device: 1 person hand held assist;Straight cane Gait Pattern/deviations: Step-through pattern;Decreased dorsiflexion - right;Decreased stance time - right;Staggering left;Staggering right(forward head) Gait velocity: decreased Gait velocity interpretation: 1.31 - 2.62 ft/sec, indicative of  limited community ambulator General Gait Details: Pt with staggering gait pattern, with increased fatigue right leg seems to show signs of functional weakness, he often put his cane down on his right foot, but when sensation was tested, it was normal.  Pt staggers and needed min hand held assist at first, we switched to the cane and he still needed min guard assist for safety.  Right leg even had a hyperextension moment as we were finishing walking into his room.          Balance Overall balance assessment: Needs assistance Sitting-balance support: Feet supported;No upper extremity supported Sitting balance-Leahy Scale: Good     Standing balance support: Single extremity supported Standing balance-Leahy Scale: Fair                               Pertinent Vitals/Pain Pain Assessment: 0-10 Pain Score: 7  Pain Location: posterior neck Pain Descriptors / Indicators: Aching Pain Intervention(s): Limited activity within patient's tolerance;Monitored during session;Repositioned    Home Living Family/patient expects to be discharged to:: Private residence Living Arrangements: Spouse/significant other Available Help at Discharge: Family;Available 24 hours/day Type of Home: House Home Access: Stairs to enter Entrance Stairs-Rails: Right;Left;Can reach both Entrance Stairs-Number of Steps: 3 Home Layout: One level Home Equipment: Cane - single point      Prior Function Level of Independence: Independent         Comments: Pt reports he drives, has freqent stumbles due to "leg problems" and at time uses a cane.  He reports he has not had any significant full falls to the ground.  Hand Dominance   Dominant Hand: Right("very" right handed)    Extremity/Trunk Assessment   Upper Extremity Assessment Upper Extremity Assessment: Defer to OT evaluation(right arm is mildly weaker than L arm and 3-5th fingers numb)    Lower Extremity Assessment Lower Extremity  Assessment: RLE deficits/detail RLE Deficits / Details: right leg is weakner (mildly) compared to left at 4/5 when tested in sitting and functionally he seems to drag it a bit.  He reports he dislocated the great toe on this side.     Cervical / Trunk Assessment Cervical / Trunk Assessment: Other exceptions Cervical / Trunk Exceptions: post op in brace.   Communication   Communication: No difficulties  Cognition Arousal/Alertness: Awake/alert Behavior During Therapy: Impulsive Overall Cognitive Status: No family/caregiver present to determine baseline cognitive functioning                                 General Comments: Pt is a tad impulsive and could not relay to me if he has ever had any therapy for his balance issues.              Assessment/Plan    PT Assessment Patient needs continued PT services  PT Problem List Decreased strength;Decreased activity tolerance;Decreased balance;Decreased mobility;Decreased knowledge of use of DME;Decreased safety awareness;Decreased knowledge of precautions;Impaired sensation;Pain       PT Treatment Interventions DME instruction;Gait training;Stair training;Functional mobility training;Therapeutic activities;Therapeutic exercise;Balance training;Patient/family education    PT Goals (Current goals can be found in the Care Plan section)  Acute Rehab PT Goals Patient Stated Goal: to go home soon PT Goal Formulation: With patient Time For Goal Achievement: 09/18/17 Potential to Achieve Goals: Good    Frequency Min 5X/week           AM-PAC PT "6 Clicks" Daily Activity  Outcome Measure Difficulty turning over in bed (including adjusting bedclothes, sheets and blankets)?: A Little Difficulty moving from lying on back to sitting on the side of the bed? : A Little Difficulty sitting down on and standing up from a chair with arms (e.g., wheelchair, bedside commode, etc,.)?: Unable Help needed moving to and from a bed to  chair (including a wheelchair)?: A Little Help needed walking in hospital room?: A Little Help needed climbing 3-5 steps with a railing? : A Little 6 Click Score: 16    End of Session Equipment Utilized During Treatment: Cervical collar;Gait belt Activity Tolerance: Patient tolerated treatment well Patient left: in bed;with call bell/phone within reach   PT Visit Diagnosis: Unsteadiness on feet (R26.81);Muscle weakness (generalized) (M62.81);Difficulty in walking, not elsewhere classified (R26.2);Pain Pain - Right/Left: (posterior) Pain - part of body: (neck)    Time: 9449-6759 PT Time Calculation (min) (ACUTE ONLY): 18 min   Charges:         Wells Guiles B. Katyana Trolinger, PT, DPT [phone]   PT Evaluation $PT Eval Moderate Complexity: 1 Mod         09/11/2017, 11:10 AM

## 2017-09-11 NOTE — Progress Notes (Signed)
Patient ID: Leonette Most., male   DOB: [birth year], 72 y.o.   MRN: [id number] Subjective: Patient reports appropriate neck soreness, arm pain gone, stable dysfunction R hand, urinary retention requiring I O cath this am  Objective: Vital signs in last 24 hours: Temp:  [97.7 F (36.5 C)-98.7 F (37.1 C)] 97.7 F (36.5 C) (08/24 0830) Pulse Rate:  [77-99] 98 (08/24 0455) Resp:  [12-20] 16 (08/24 0455) BP: (179-211)/(59-98) 189/84 (08/24 0455) SpO2:  [92 %-97 %] 94 % (08/24 0455) Weight:  [95.3 kg] 95.3 kg (08/23 1236)  Intake/Output from previous day: 08/23 0701 - 08/24 0700 In: 1800 [P.O.:200; I.V.:1600] Out: 495 [Urine:395; Blood:100] Intake/Output this shift: No intake/output data recorded.  Neurologic: Grossly normal with stable myelopathy  Lab Results: Lab Results  Component Value Date   WBC 10.7 (H) 09/11/2017   HGB 12.3 (L) 09/11/2017   HCT 37.8 (L) 09/11/2017   MCV 87.7 09/11/2017   PLT 158 09/11/2017   Lab Results  Component Value Date   INR 1.07 09/11/2017   BMET Lab Results  Component Value Date   NA 133 (L) 09/11/2017   K 4.4 09/11/2017   CL 96 (L) 09/11/2017   CO2 25 09/11/2017   GLUCOSE 367 (H) 09/11/2017   BUN 23 09/11/2017   CREATININE 1.08 09/11/2017   CALCIUM 8.6 (L) 09/11/2017    Studies/Results: Dg Cervical Spine 2-3 Views  Result Date: 09/10/2017 CLINICAL DATA:  Laminectomy C4-5, C5-6 and C6-7. Posterolateral arthrodesis. EXAM: CERVICAL SPINE - 2-3 VIEW; DG C-ARM 61-120 MIN COMPARISON:  None. FINDINGS: Two lateral intraoperative views of the cervical spine were provided with visualization from skull base through inferior endplate of C5. A total of 6 seconds of fluoroscopic time was performed during reported C4 through C7 laminectomy procedure and posterolateral arthrodesis. A curved probe is seen projecting over the C3 spinous process on the initial intraoperative lateral view. The second lateral view demonstrates posterior screws  traversing posterior elements at C4 and C5. IMPRESSION: Intraoperative cervical spine images acquired during C4 through C7 laminectomy and posterolateral arthrodesis. No immediate intraoperative complications. Electronically Signed   By: Ashley Royalty M.D.   On: 09/10/2017 20:17   Dg C-arm 1-60 Min  Result Date: 09/10/2017 CLINICAL DATA:  Laminectomy C4-5, C5-6 and C6-7. Posterolateral arthrodesis. EXAM: CERVICAL SPINE - 2-3 VIEW; DG C-ARM 61-120 MIN COMPARISON:  None. FINDINGS: Two lateral intraoperative views of the cervical spine were provided with visualization from skull base through inferior endplate of C5. A total of 6 seconds of fluoroscopic time was performed during reported C4 through C7 laminectomy procedure and posterolateral arthrodesis. A curved probe is seen projecting over the C3 spinous process on the initial intraoperative lateral view. The second lateral view demonstrates posterior screws traversing posterior elements at C4 and C5. IMPRESSION: Intraoperative cervical spine images acquired during C4 through C7 laminectomy and posterolateral arthrodesis. No immediate intraoperative complications. Electronically Signed   By: Ashley Royalty M.D.   On: 09/10/2017 20:17   Dg C-arm 1-60 Min  Result Date: 09/10/2017 CLINICAL DATA:  Laminectomy C4-5, C5-6 and C6-7. Posterolateral arthrodesis. EXAM: CERVICAL SPINE - 2-3 VIEW; DG C-ARM 61-120 MIN COMPARISON:  None. FINDINGS: Two lateral intraoperative views of the cervical spine were provided with visualization from skull base through inferior endplate of C5. A total of 6 seconds of fluoroscopic time was performed during reported C4 through C7 laminectomy procedure and posterolateral arthrodesis. A curved probe is seen projecting over the C3 spinous process on the initial intraoperative lateral view.  The second lateral view demonstrates posterior screws traversing posterior elements at C4 and C5. IMPRESSION: Intraoperative cervical spine images acquired  during C4 through C7 laminectomy and posterolateral arthrodesis. No immediate intraoperative complications. Electronically Signed   By: Ashley Royalty M.D.   On: 09/10/2017 20:17    Assessment/Plan: Overall doing ok, I & O cath prn, mobilize today  Estimated body mass index is 29.29 kg/m as calculated from the following:   Height as of this encounter: 5\' 11"  (1.803 m).   Weight as of this encounter: 95.3 kg.    LOS: 1 day    JONES,DAVID S 09/11/2017, 8:37 AM

## 2017-09-12 LAB — MRSA PCR SCREENING: MRSA BY PCR: NEGATIVE

## 2017-09-12 NOTE — Progress Notes (Signed)
Patient ID: Grant [name]., male   DOB: [birth year], 72 y.o.   MRN: [id number] Complaining of appropriate neck soreness. No arm pain. Fell last night. No change in neurologic exam. I find no focal weakness but stable myelopathy. Dressing dry. He is lying in bed comfortably. Therapy today. 2 new pain management. Home when pain controlled and more stable on feet

## 2017-09-12 NOTE — Progress Notes (Addendum)
Physical Therapy Treatment Patient Details Name: Grant [name] [place]. MRN: [id number] DOB: 02/18/1945 Today's Date: 09/12/2017    History of Present Illness 72 y.o. male admitted on 09/10/17 for elective C4-7 posterior decompression and fusion.  Pt with significant PMH of back fracture and glaucoma.      PT Comments    Patient seen for activity progression. OF NOTE overnight fall per nursing.  Patient received OOB in chair without collar on, educated on proper use of collar. Patient remains impulsive and poorly compliance with education and precautions throughout session. Patient also noted increased BUE pain and new onset RLE weakness more pronounced during ambulation. Patient currently high fall risk and remains unsafe with mobility. Feel patient will continue to benefit from further therapy. Will continue to see and progress as tolerated.   Follow Up Recommendations  Outpatient PT;Supervision for mobility/OOB     Equipment Recommendations  None recommended by PT    Recommendations for Other Services       Precautions / Restrictions Precautions Precautions: Fall;Cervical Precaution Booklet Issued: Yes (comment) Precaution Comments: patient re educated on need for use of cervical collar (was OOB and not wearing collar upon arrival. Required Braces or Orthoses: Cervical Brace Cervical Brace: Hard collar;At all times(except in shower) Restrictions Weight Bearing Restrictions: No    Mobility  Bed Mobility               General bed mobility comments: OOB in chair upon arrival  Transfers Overall transfer level: Needs assistance Equipment used: None Transfers: Sit to/from Stand Sit to Stand: Min assist         General transfer comment: Min assist for stability  Ambulation/Gait Ambulation/Gait assistance: Min assist Gait Distance (Feet): 160 Feet Assistive device: 1 person hand held assist;Straight cane Gait Pattern/deviations: Step-through pattern;Decreased  dorsiflexion - right;Decreased stance time - right;Staggering left;Staggering right(forward head) Gait velocity: decreased Gait velocity interpretation: 1.31 - 2.62 ft/sec, indicative of limited community ambulator General Gait Details: Patient with poor stability, multiple LOB and noted RLE clearence difficulties with gait causing further imbalance   Stairs Stairs: Yes Stairs assistance: Min assist Stair Management: One rail Right;Alternating pattern Number of Stairs: 12 General stair comments: educated on performance, min assist for stability    Wheelchair Mobility    Modified Rankin (Stroke Patients Only)       Balance Overall balance assessment: Needs assistance;History of Falls(fall during hospital course) Sitting-balance support: Feet supported;No upper extremity supported Sitting balance-Leahy Scale: Good     Standing balance support: No upper extremity supported;During functional activity Standing balance-Leahy Scale: Fair                              Cognition Arousal/Alertness: Awake/alert Behavior During Therapy: Impulsive Overall Cognitive Status: Impaired/Different from baseline Area of Impairment: Attention;Safety/judgement                   Current Attention Level: Selective     Safety/Judgement: Decreased awareness of safety     General Comments: Patient impulsive and non compliant with precautions      Exercises Other Exercises Other Exercises: Educated concerning AAROM/PROM with stretch to R digits to maximize functional extension.     General Comments        Pertinent Vitals/Pain Pain Assessment: 0-10 Pain Score: 8  Pain Location: posterior neck and bilateral UEs Pain Descriptors / Indicators: Aching Pain Intervention(s): Monitored during session    Home Living  Prior Function            PT Goals (current goals can now be found in the care plan section) Acute Rehab PT Goals Patient  Stated Goal: to go home soon PT Goal Formulation: With patient Time For Goal Achievement: 09/18/17 Potential to Achieve Goals: Good Progress towards PT goals: Progressing toward goals    Frequency    Min 5X/week      PT Plan Current plan remains appropriate    Co-evaluation              AM-PAC PT "6 Clicks" Daily Activity  Outcome Measure  Difficulty turning over in bed (including adjusting bedclothes, sheets and blankets)?: A Little Difficulty moving from lying on back to sitting on the side of the bed? : A Little Difficulty sitting down on and standing up from a chair with arms (e.g., wheelchair, bedside commode, etc,.)?: Unable Help needed moving to and from a bed to chair (including a wheelchair)?: A Little Help needed walking in hospital room?: A Little Help needed climbing 3-5 steps with a railing? : A Little 6 Click Score: 16    End of Session Equipment Utilized During Treatment: Cervical collar;Gait belt Activity Tolerance: Patient tolerated treatment well Patient left: in chair;with call bell/phone within reach;with family/visitor present   PT Visit Diagnosis: Unsteadiness on feet (R26.81);Muscle weakness (generalized) (M62.81);Difficulty in walking, not elsewhere classified (R26.2);Pain Pain - Right/Left: (posterior) Pain - part of body: (neck)     Time: 2458-0998 PT Time Calculation (min) (ACUTE ONLY): 20 min  Charges:  $Gait Training: 8-22 mins                     Alben Deeds, PT DPT  Board Certified Neurologic Specialist [place] 09/12/2017, 2:09 PM

## 2017-09-12 NOTE — Plan of Care (Signed)
Pt ambulated in room and hall today with PT. Needs assist from staff as his gait is unsteady.

## 2017-09-13 ENCOUNTER — Inpatient Hospital Stay (HOSPITAL_COMMUNITY): Payer: Medicare HMO

## 2017-09-13 ENCOUNTER — Encounter (HOSPITAL_COMMUNITY): Payer: Self-pay | Admitting: Neurosurgery

## 2017-09-13 MED ORDER — ASPIRIN 325 MG PO TABS: 650.0000 mg | ORAL_TABLET | Freq: Four times a day (QID) | ORAL | Status: DC | PRN

## 2017-09-13 MED ORDER — TRAMADOL HCL 50 MG PO TABS: 50.0000 mg | ORAL_TABLET | Freq: Four times a day (QID) | ORAL | 0 refills | Status: DC | PRN

## 2017-09-13 MED ORDER — OXYCODONE-ACETAMINOPHEN 5-325 MG PO TABS
1.0000 | ORAL_TABLET | ORAL | Status: DC | PRN
  Administered 2017-09-13: 1 via ORAL
  Filled 2017-09-13: qty 1

## 2017-09-13 MED ORDER — OXYCODONE-ACETAMINOPHEN 5-325 MG PO TABS: 1.0000 | ORAL_TABLET | ORAL | 0 refills | Status: DC | PRN

## 2017-09-13 MED ORDER — METHOCARBAMOL 500 MG PO TABS: 500.0000 mg | ORAL_TABLET | Freq: Four times a day (QID) | ORAL | 2 refills | Status: DC | PRN

## 2017-09-13 MED FILL — Thrombin (Recombinant) For Soln 20000 Unit: CUTANEOUS | Qty: 1 | Status: AC

## 2017-09-13 NOTE — Discharge Summary (Signed)
Physician Discharge Summary  Patient ID: Grant [name]. MRN: [id number] DOB/AGE: 02-11-1945 72 y.o.  Admit date: 09/10/2017 Discharge date: 09/13/2017  Admission Diagnoses:  Cervical myelopathy  Discharge Diagnoses:  Same Active Problems:   Stenosis of cervical spine with myelopathy [facility])   Discharged Condition: Stable  Hospital Course:  [facility]. is a 72 y.o. male who was admitted for the below procedure. There were no post operative complications. At time of discharge, pain was well controlled, ambulating with Pt/OT, tolerating po, voiding normal. Ready for discharge.  Treatments: Surgery 1. C4-C7 laminectomy for decompression of spinal cord 2. Posterior segmental instrumentation using Medtronic vertex lateral mass screws, C4-C7 3. Posterior lateral arthrodesis C4-C7 4. Use of locally harvested morselized bone autograft  Discharge Exam: Blood pressure (!) 164/131, pulse 92, temperature (!) 100.4 F (38 C), temperature source Oral, resp. rate 17, height 5\' 11"  (1.803 m), weight 95.3 kg, SpO2 95 %. Awake, alert, oriented Speech fluent, appropriate CN grossly intact 5/5 BUE/BLE Wound c/d/i  Disposition: Discharge disposition: 01-Home or Self Care       Discharge Instructions    Call MD for:  difficulty breathing, headache or visual disturbances   Complete by:  As directed    Call MD for:  persistant dizziness or light-headedness   Complete by:  As directed    Call MD for:  redness, tenderness, or signs of infection (pain, swelling, redness, odor or green/yellow discharge around incision site)   Complete by:  As directed    Call MD for:  severe uncontrolled pain   Complete by:  As directed    Call MD for:  temperature >100.4   Complete by:  As directed    Diet general   Complete by:  As directed    Driving Restrictions   Complete by:  As directed    Do not drive until given clearance.   Increase activity slowly   Complete by:  As  directed    Lifting restrictions   Complete by:  As directed    Do not lift anything >10lbs. Avoid bending and twisting in awkward positions. Avoid bending at the back.   May shower / Bathe   Complete by:  As directed    In 24 hours. Okay to wash wound with warm soapy water. Avoid scrubbing the wound. Pat dry.   Remove dressing in 24 hours   Complete by:  As directed      Allergies as of 09/13/2017      Reactions   Augmentin [amoxicillin-pot Clavulanate] Nausea And Vomiting   Hydrocodone Other (See Comments)   " I get goofy and crazy thoughts. "   Oxycontin [oxycodone Hcl] Other (See Comments)   " I couldn't stay still; I was walking around in circles " Pt has tolerated Percocet this medication 08/2017 admssion      Medication List    TAKE these medications   alfuzosin 10 MG 24 hr tablet Commonly known as:  UROXATRAL Take 10 mg by mouth daily.   aspirin 325 MG tablet Take 2 tablets (650 mg total) by mouth every 6 (six) hours as needed for mild pain. For pain Start taking on:  09/16/2017 What changed:  These instructions start on 09/16/2017. If you are unsure what to do until then, ask your doctor or other care provider.   cilostazol 50 MG tablet Commonly known as:  PLETAL Take 1 tablet by mouth 2 (two) times daily.   methocarbamol 500 MG tablet Commonly known as:  ROBAXIN Take 1 tablet (500 mg total) by mouth every 6 (six) hours as needed for muscle spasms.   multivitamin with minerals tablet Take 1 tablet by mouth daily.   oxyCODONE-acetaminophen 5-325 MG tablet Commonly known as:  PERCOCET/ROXICET Take 1-2 tablets by mouth every 4 (four) hours as needed for moderate pain or severe pain.   rosuvastatin 20 MG tablet Commonly known as:  CRESTOR Take 20 mg by mouth daily.   traMADol 50 MG tablet Commonly known as:  ULTRAM Take 1-2 tablets (50-100 mg total) by mouth every 6 (six) hours as needed for moderate pain or severe pain.        SignedTraci Sermon 09/13/2017, 5:16 PM

## 2017-09-13 NOTE — Care Management Important Message (Signed)
Important Message  Patient Details  Name: Grant [name] [place]. MRN: [id number] Date of Birth: 29-Jan-1945   Medicare Important Message Given:  Yes    Tequita Marrs 09/13/2017, 4:09 PM

## 2017-09-13 NOTE — Progress Notes (Signed)
Patient discharged home wife and daughter with condom cath on and Aspen collar. Prescriptions and discharge orders given to patient and wife after reviewing all of them.

## 2017-09-13 NOTE — Progress Notes (Addendum)
Occupational Therapy Treatment Patient Details Name: Grant [name]. MRN: [id number] DOB: 02/10/1945 Today's Date: 09/13/2017    History of present illness 72 y.o. male admitted on 09/10/17 for elective C4-7 posterior decompression and fusion.  Pt with significant PMH of back fracture and glaucoma.     OT comments  Pt with increase in pain in bilateral UE and posterior neck/upper back this session as compared with previous session. Reports that this increased since fall on Saturday pm and per PA will have further imaging to address. Pt continues to have limited control of R digits III-V this session and facilitated AAROM in extension. He was not wearing cervical collar on my arrival and moving in bed in such a way that was breaking cervical precautions. Continued education concerning cervical precautions and brace wear schedule. Pt also continues to have difficulty with safety awareness, impulsivity, and memory impacting his ability to complete ADL safely. He continues to be frustrated with his recovery and offered support. OT will continue to follow while admitted.    Follow Up Recommendations  No OT follow up;Supervision/Assistance - 24 hour    Equipment Recommendations  3 in 1 bedside commode    Recommendations for Other Services      Precautions / Restrictions Precautions Precautions: Fall;Cervical Precaution Booklet Issued: Yes (comment) Precaution Comments: Pt re-educated concerning need to use cervical collar. It was off on my arrival.  Required Braces or Orthoses: Cervical Brace Cervical Brace: Hard collar;At all times(except in shower) Restrictions Weight Bearing Restrictions: No       Mobility Bed Mobility Overal bed mobility: Needs Assistance Bed Mobility: Sit to Supine       Sit to supine: HOB elevated;Min assist   General bed mobility comments: Assist to power up trunk from [employer].   Transfers Overall transfer level: Needs assistance Equipment used:  None Transfers: Sit to/from Stand Sit to Stand: Min assist         General transfer comment: Assist for safety and stability.     Balance Overall balance assessment: Needs assistance;History of Falls(fall during hospital course) Sitting-balance support: Feet supported;No upper extremity supported Sitting balance-Leahy Scale: Good     Standing balance support: No upper extremity supported;During functional activity Standing balance-Leahy Scale: Fair Standing balance comment: Guarding assist in standing.                            ADL either performed or assessed with clinical judgement   ADL Overall ADL's : Needs assistance/impaired Eating/Feeding: Sitting;Minimal assistance Eating/Feeding Details (indicate cue type and reason): Has not been using tubing because he reports it is difficult to place on utensils. Although he reports that it helped. OT placed on utensils so that pt could use for breakfast.  Grooming: Min guard;Standing;Wash/dry hands           Upper Body Dressing : Moderate assistance;Sitting       Toilet Transfer: Minimal assistance;Ambulation Toilet Transfer Details (indicate cue type and reason): Assist to maintain steadiness on feet and to maximize safety and prevent impulsivity.          Functional mobility during ADLs: Minimal assistance General ADL Comments: Continued to stress education concerning cervical precautions, brace wear schedule, and compensatory strategies. Pt with cervical collar off on my arrival and writhing in bed. He and wife report that pt has been told he is able to remove while in bed. Do note this in order set. However, pt moving in bed breaking  cervical precautions when I arrived and educated concerning recommendation to continue use of cervical collar to maximize safety as he is frequently moving in bed.      Vision   Vision Assessment?: No apparent visual deficits   Perception     Praxis      Cognition  Arousal/Alertness: Awake/alert Behavior During Therapy: Impulsive Overall Cognitive Status: Impaired/Different from baseline Area of Impairment: Attention;Safety/judgement;Memory                   Current Attention Level: Sustained Memory: Decreased short-term memory;Decreased recall of precautions   Safety/Judgement: Decreased awareness of safety     General Comments: Pt continues to be impulsive, with poor safety awareness, and not adhering to precautions or brace wear.         Exercises Exercises: Other exercises Other Exercises Other Exercises: AAROM R digit composite extension. Limited active motion.    Shoulder Instructions       General Comments Wife present during session. She reports that she fell overnight as well. Pt is very concerned with his current level of pain. During my session, PT discussed with PA who reports plan to pursue further imaging. Pt stating that "if I had known it would be this bad, I wouldn't have done it" and he also stated that he would not care if he died because he was in so much pain. OT provided encouragement and reassurance.    Pertinent Vitals/ Pain       Pain Assessment: Faces Faces Pain Scale: Hurts whole lot Pain Location: posterior neck and bilateral UEs Pain Descriptors / Indicators: Aching Pain Intervention(s): Limited activity within patient's tolerance;Monitored during session;Repositioned  Home Living                                          Prior Functioning/Environment              Frequency  Min 2X/week        Progress Toward Goals  OT Goals(current goals can now be found in the care plan section)  Progress towards OT goals: Not progressing toward goals - comment(increase in pain; confusion)  Acute Rehab OT Goals Patient Stated Goal: to make this stop hurting OT Goal Formulation: With patient Time For Goal Achievement: 09/25/17 Potential to Achieve Goals: Good  Plan Discharge plan  remains appropriate    Co-evaluation                 AM-PAC PT "6 Clicks" Daily Activity     Outcome Measure   Help from another person eating meals?: A Little Help from another person taking care of personal grooming?: A Little Help from another person toileting, which includes using toliet, bedpan, or urinal?: A Little Help from another person bathing (including washing, rinsing, drying)?: A Little Help from another person to put on and taking off regular upper body clothing?: A Lot Help from another person to put on and taking off regular lower body clothing?: A Little 6 Click Score: 17    End of Session Equipment Utilized During Treatment: Gait belt  OT Visit Diagnosis: Other abnormalities of gait and mobility (R26.89);Pain;Muscle weakness (generalized) (M62.81) Pain - Right/Left: Right(bilateral) Pain - part of body: Arm;Hand   Activity Tolerance Patient tolerated treatment well   Patient Left Other (comment)(ambulating with PT)   Nurse Communication Mobility status        Time:  0722-5750 OT Time Calculation (min): 19 min  Charges: OT General Charges $OT Visit: 1 Visit OT Treatments $Self Care/Home Management : 8-22 mins  Norman Herrlich, MS OTR/L  Pager: [facility] 09/13/2017, 9:37 AM

## 2017-09-13 NOTE — Progress Notes (Signed)
Neurosurgery Progress Note  Noted issues overnight. Patient reports falling Saturday night. Do not see any documentation. Believes he injured his neck. Has been having excruciating pain in b/l traps since surgery . Has been having pain in BUE when his arms are extended since yesterday - new since fall.  EXAM:  BP (!) 164/131 (BP Location: Right Arm)   Pulse 92   Temp (!) 100.4 F (38 C) (Oral)   Resp 17   Ht 5\' 11"  (1.803 m)   Wt 95.3 kg   SpO2 95%   BMI 29.29 kg/m   Awake, alert, oriented  Speech fluent, appropriate  MAEW with good strength Incision: c/d/i  PLAN Looks okay this morning. Difficulties with pain control, especially since fall. Will obtain Cspine Xrays and change pain regimen.  Would like to go home later today if pain is better controlled. I do not think he will be okay pain wise, but if he is, okay for d/c.

## 2017-09-13 NOTE — Progress Notes (Signed)
Physical Therapy Treatment Patient Details Name: Grant [name]. MRN: [id number] DOB: 01-02-1946 Today's Date: 09/13/2017    History of Present Illness 72 y.o. male admitted on 09/10/17 for elective C4-7 posterior decompression and fusion.  Pt with significant PMH of back fracture and glaucoma.      PT Comments    Patient seen for activity progression and balance/safety with mobility. Patient remains impulsive with limited safety awareness and poor compliance with precautions. At this time, I remain concerned about patients ability to manage safely in the home. Feel it may be beneficial for HHPT assessment prior to transitioning to outpatient to ensure safety with mobility at home.   Follow Up Recommendations  Home health PT;Supervision for mobility/OOB     Equipment Recommendations  None recommended by PT    Recommendations for Other Services       Precautions / Restrictions Precautions Precautions: Fall;Cervical Precaution Booklet Issued: Yes (comment) Precaution Comments: Pt re-educated concerning need to use cervical collar. It was off on my arrival.  Required Braces or Orthoses: Cervical Brace Cervical Brace: Hard collar;At all times(except in shower) Restrictions Weight Bearing Restrictions: No    Mobility  Bed Mobility Overal bed mobility: Needs Assistance Bed Mobility: Sit to Supine       Sit to supine: HOB elevated;Min assist   General bed mobility comments: Assist to reposition in bed with multi modal cues for comliance with precautions  Transfers Overall transfer level: Needs assistance Equipment used: None Transfers: Sit to/from Stand Sit to Stand: Min assist         General transfer comment: Min assist for stability and safety, patient remains impulsive with activities and transfers  Ambulation/Gait Ambulation/Gait assistance: Min assist Gait Distance (Feet): 280 Feet Assistive device: 1 person hand held assist;Straight cane Gait  Pattern/deviations: Step-through pattern;Decreased dorsiflexion - right;Decreased stance time - right;Staggering left;Staggering right(forward head) Gait velocity: decreased Gait velocity interpretation: 1.31 - 2.62 ft/sec, indicative of limited community ambulator General Gait Details: patient continues to show poor stability with ambulation with ocassional need for min assist to prevent LOB. Patient reports increased pain in bilateral UEs again today. Some RLE weakness persists but improved modestly compared to previous session   Stairs             Wheelchair Mobility    Modified Rankin (Stroke Patients Only)       Balance Overall balance assessment: Needs assistance;History of Falls(fall during hospital course) Sitting-balance support: Feet supported;No upper extremity supported Sitting balance-Leahy Scale: Good     Standing balance support: No upper extremity supported;During functional activity Standing balance-Leahy Scale: Fair Standing balance comment: Guarding assist in standing. (Guarding during functional task performance, increased sway )                            Cognition Arousal/Alertness: Awake/alert Behavior During Therapy: Impulsive Overall Cognitive Status: Impaired/Different from baseline Area of Impairment: Attention;Safety/judgement;Memory                   Current Attention Level: Sustained Memory: Decreased short-term memory;Decreased recall of precautions   Safety/Judgement: Decreased awareness of safety     General Comments: Pt continues to be impulsive, with poor safety awareness, and not adhering to precautions. Patient remains limitedly receptive to education and cues      Exercises Other Exercises Other Exercises: reeducated on cervical precautions and car transfers    General Comments General comments (skin integrity, edema, etc.): Wife present during session.  She reports that she fell overnight as well. Pt is very  concerned with his current level of pain. During my session, PT discussed with PA who reports plan to pursue further imaging. Pt stating that "if I had known it would be this bad, I wouldn't have done it" and he also stated that he would not care if he died because he was in so much pain. OT provided encouragement and reassurance.      Pertinent Vitals/Pain Pain Assessment: Faces Faces Pain Scale: Hurts whole lot Pain Location: posterior neck and bilateral UEs Pain Descriptors / Indicators: Aching Pain Intervention(s): Limited activity within patient's tolerance    Home Living                      Prior Function            PT Goals (current goals can now be found in the care plan section) Acute Rehab PT Goals Patient Stated Goal: to make this stop hurting PT Goal Formulation: With patient Time For Goal Achievement: 09/18/17 Potential to Achieve Goals: Good Progress towards PT goals: Progressing toward goals    Frequency    Min 5X/week      PT Plan Current plan remains appropriate    Co-evaluation              AM-PAC PT "6 Clicks" Daily Activity  Outcome Measure  Difficulty turning over in bed (including adjusting bedclothes, sheets and blankets)?: A Little Difficulty moving from lying on back to sitting on the side of the bed? : A Little Difficulty sitting down on and standing up from a chair with arms (e.g., wheelchair, bedside commode, etc,.)?: Unable Help needed moving to and from a bed to chair (including a wheelchair)?: A Little Help needed walking in hospital room?: A Little Help needed climbing 3-5 steps with a railing? : A Little 6 Click Score: 16    End of Session Equipment Utilized During Treatment: Cervical collar;Gait belt Activity Tolerance: Patient tolerated treatment well Patient left: in chair;with call bell/phone within reach;with family/visitor present   PT Visit Diagnosis: Unsteadiness on feet (R26.81);Muscle weakness (generalized)  (M62.81);Difficulty in walking, not elsewhere classified (R26.2);Pain Pain - Right/Left: (posterior) Pain - part of body: (neck)     Time: 7035-0093 PT Time Calculation (min) (ACUTE ONLY): 14 min  Charges:  $Gait Training: 8-22 mins                     Alben Deeds, PT DPT  Board Certified Neurologic Specialist [place] 09/13/2017, 10:21 AM

## 2017-09-13 NOTE — Discharge Instructions (Signed)
You must wear your neck brace whenever you are sitting upright or walking. It is okay to take off when laying flat.

## 2017-09-13 NOTE — Progress Notes (Signed)
Call to Dr Cleotilde Neer office because wife is insisting on leaving because he did not want to be here and did not know what is going on. Reviewed with patient waiting on to see if Dr is geting out of surgery and family members agreed with plan.  Waiting now on call.

## 2017-09-13 NOTE — Progress Notes (Deleted)
0400- Received report from float RN being pulled to go back to her floor.  68- RN assisting patient Petrik) to stand up, Rollene Fare (Amaad's wife) thought patient's telemetry wires were tangled and were going to pull on him so she leaned down to untangle them and slid to the ground, landing primarily on her left knee and elbow. Vital signs obtained, RNs and tech assisted Rollene Fare to a chair, placed her in non-skid yellow socks, elevated her leg and placed ice packs on her knee and elbow. Regina's Temp=97.5 (per patient and Rollene Fare this is her baseline), SpO2=99%, HR=70, BP=118/41. Rollene Fare said she had ibuprofen in her purse and RN brought her purse to her so she could take it. Regina c/o pain 3/10 in her left knee. RN strongly suggested Rollene Fare go to the ED to get checked out, but she refused three times. Jimmy was sad this happened to his wife and feels like it was his fault because he had wanted to get up to the chair, and if he hadn't wanted to do that, his wife would be okay.

## 2017-09-13 NOTE — Op Note (Signed)
NEUROSURGERY OPERATIVE NOTE   PREOP DIAGNOSIS:  1. Cervical stenosis with myelopathy  POSTOP DIAGNOSIS: Same  PROCEDURE: 1. C4-C7 laminectomy for decompression of spinal cord 2. Posterior segmental instrumentation using Medtronic vertex lateral mass screws, C4-C7 3. Posterior lateral arthrodesis C4-C7 4. Use of locally harvested morselized bone autograft  SURGEON: Dr. Consuella Lose, MD  ASSISTANT: Ferne Reus, PA-C  ANESTHESIA: General Endotracheal  EBL: 200cc  SPECIMENS: none  DRAINS: None  COMPLICATIONS: None immediate none immediate  CONDITION:  Hemodynamically stable to postanesthesia care unit  HISTORY: Grant [name]. is a 71 y.o. male initially seen in the outpatient neurosurgery clinic with primarily right arm pain and weakness.  He related a history of accident while riding his lawnmower.  His MRI scan did demonstrate significant multifactorial cervical stenosis worst between C4 and C7.  Treatment options were discussed including my recommendation for cervical decompression with fusion.  Risks and benefits of the surgery were explained in detail to the patient and his family.  After all questions were answered informed consent was obtained and witnessed.  PROCEDURE IN DETAIL: The patient was brought to the operating room. After induction of general anesthesia, the patient was positioned on the operative table in the [place] head holder in the prone position. All pressure points were meticulously padded. Skin incision was then marked out and prepped and draped in the usual sterile fashion.  After timeout was conducted, midline skin incision was infiltrated with local anesthetic with epinephrine.  Incision was then made sharply and carried down through subcutaneous tissue.  The cervical fascia was incised in the midline, and utilizing the avascular midline plane, the suboccipital musculature was divided until the spinous processes at C3-C7 were  identified.  A towel clip was placed on the C3 spinous process and intraoperative fluoroscopy confirmed our location.  Subperiosteal dissection was then carried out along the lamina of C4-C7, out to the lateral mass.  Self-retaining retractor was then placed.  At this point, using standard anatomic landmarks, pilot holes were drilled for bilateral lateral mass screws at C4, C5, C6, and C7.  A sound was used to confirm good trajectory.  The pilot holes were then tapped to approximately 14 mm.  At this point attention was turned to decompression.  Using a combination of the high-speed drill and Kerrison Rogers, laminectomy was completed which included more than 50% of the inferior half of C4, the entirety of C5, C6, and more than 50% of the superior half of C7.  Ligamentum flavum was elevated and removed.  Kerrison Stann Mainland were then used to further decompress laterally until the lateral edge of the thecal sac was identified bilaterally indicating good decompression.  Hemostasis on the epidural surface was achieved using a combination of bipolar electrocautery and morselized Gelfoam and thrombin.  Bone harvested during the decompression was saved for use for arthrodesis.  At this point, a high-speed drill was used to decorticate the lateral masses and facet joints from C4 down to C7.  14 mm lateral mass screws were then placed bilaterally at C4, C5, C6, and C7.  A pre-bent lordotic rod was then placed and secured with set screws which were final tightened.  A final lateral fluoroscopic image confirmed good location of the implanted hardware.  Morselized bone harvested during the decompression was then placed over the decorticated bone surfaces and within the facet joints extending from C4-C7 in order to facilitate posterolateral arthrodesis.  At this point the self-retaining retractors were removed, and with the wound was closed  in multiple layers in standard fashion using a combination of interrupted 0 Vicryl  stitches.  The skin was closed with standard surgical skin staples and bacitracin ointment and sterile dressing was applied.  The patient was then removed from the [place] head holder, and transferred to the stretcher. At the end of the case all sponge, needle, and instrument counts were correct. The patient was then taken to the post-anesthesia care unit in stable hemodynamic condition.

## 2017-09-13 NOTE — Progress Notes (Signed)
Patient and wife extremely upset stating they did not know what is going on. Reviewed note from [facility], [place] and gave patient pain med and Robaxin.  Patient agreed to wait and satisfied that I was not starting IV due to possible discharge.

## 2017-09-14 ENCOUNTER — Encounter (HOSPITAL_COMMUNITY): Payer: Self-pay | Admitting: Neurosurgery

## 2017-09-14 NOTE — OR Nursing (Signed)
Addendum created to reflect correct Recovery Care Complete time on 8/23

## 2017-09-19 ENCOUNTER — Emergency Department (HOSPITAL_COMMUNITY)
Admission: EM | Admit: 2017-09-19 | Discharge: 2017-09-19 | Disposition: A | Discharge status: Home / Self Care | Payer: Medicare HMO | Attending: Emergency Medicine | Admitting: Emergency Medicine

## 2017-09-19 ENCOUNTER — Encounter (HOSPITAL_COMMUNITY): Payer: Self-pay | Admitting: *Deleted

## 2017-09-19 ENCOUNTER — Other Ambulatory Visit: Payer: Self-pay

## 2017-09-19 DIAGNOSIS — R69 Illness, unspecified: Secondary | ICD-10-CM | POA: Diagnosis not present

## 2017-09-19 DIAGNOSIS — Z8546 Personal history of malignant neoplasm of prostate: Secondary | ICD-10-CM | POA: Insufficient documentation

## 2017-09-19 DIAGNOSIS — Z79899 Other long term (current) drug therapy: Secondary | ICD-10-CM | POA: Insufficient documentation

## 2017-09-19 DIAGNOSIS — M542 Cervicalgia: Secondary | ICD-10-CM | POA: Insufficient documentation

## 2017-09-19 DIAGNOSIS — R200 Anesthesia of skin: Secondary | ICD-10-CM | POA: Diagnosis present

## 2017-09-19 DIAGNOSIS — G8918 Other acute postprocedural pain: Secondary | ICD-10-CM | POA: Insufficient documentation

## 2017-09-19 DIAGNOSIS — R52 Pain, unspecified: Secondary | ICD-10-CM | POA: Diagnosis not present

## 2017-09-19 DIAGNOSIS — Z87891 Personal history of nicotine dependence: Secondary | ICD-10-CM | POA: Diagnosis not present

## 2017-09-19 DIAGNOSIS — R0902 Hypoxemia: Secondary | ICD-10-CM | POA: Diagnosis not present

## 2017-09-19 DIAGNOSIS — R202 Paresthesia of skin: Secondary | ICD-10-CM | POA: Diagnosis not present

## 2017-09-19 MED ORDER — KETOROLAC TROMETHAMINE 10 MG PO TABS: 10.0000 mg | ORAL_TABLET | Freq: Four times a day (QID) | ORAL | 0 refills | Status: AC

## 2017-09-19 MED ORDER — KETOROLAC TROMETHAMINE 30 MG/ML IJ SOLN
30.0000 mg | Freq: Once | INTRAMUSCULAR | Status: AC
  Administered 2017-09-19: 30 mg via INTRAMUSCULAR
  Filled 2017-09-19: qty 1

## 2017-09-19 NOTE — ED Triage Notes (Signed)
Pt brought in by [facility] for c/o SI that started today; pt had neck  surgery on 09/10/17;  Pt has staples to middle of neck; pt states he has suicidal thoughts; pt has no specific plan

## 2017-09-19 NOTE — ED Provider Notes (Signed)
[facility] EMERGENCY DEPARTMENT Provider Note   CSN: [id number] Arrival date & time: 09/19/17  2209     History   Chief Complaint Chief Complaint  Patient presents with  . V70.1    HPI Grant [name]. is a 72 y.o. male.  Patient with cervical laminectomy 1 week ago with neurosurgery to correct right arm numbness and tingling.  Patient discharged about 5 days ago and states that home pain medications are not helping.  Patient currently taking narcotics and muscle relaxant without much relief.  Denies any new trauma.  No numbness or tingling of his upper extremities.  No fevers, no chills.  Patient was unable to contact neurosurgery for further pain recommendations.  Patient has been very upset and family concerned about him possibly hurting himself.  However after discussion with family and the patient he is not suicidal or homicidal.  He is just frustrated with his pain from his surgery.  The history is provided by the patient.  Illness  This is a new problem. The current episode started more than 2 days ago. The problem occurs constantly. The problem has not changed since onset.Pertinent negatives include no chest pain, no abdominal pain, no headaches and no shortness of breath. Nothing aggravates the symptoms. Nothing relieves the symptoms. He has tried nothing (narcotics ) for the symptoms. The treatment provided no relief.    Past Medical History:  Diagnosis Date  . Cancer [name])    prostate  . Cervical stenosis of spine   . GERD (gastroesophageal reflux disease)   . Glaucoma (increased eye pressure)   . Wears glasses     Patient Active Problem List   Diagnosis Date Noted  . 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 Esophageal reflux 04/10/2012    Past Surgical History:  Procedure Laterality Date  . fracture back    . HERNIA REPAIR    . POSTERIOR CERVICAL FUSION/FORAMINOTOMY N/A 09/10/2017   Procedure: LAMINECTOMY CERVICAL FOUR - CERVICAL  FIVE, CERVICAL FIVE - CERVICAL SIX, CERVICAL SIX - CERVICAL SEVEN, POSTERIOR SEGMENTAL INSTRUMENTATION, POSTERIOR LATERAL ARTHRODESIS;  Surgeon: Consuella Lose, MD;  Location: [facility];  Service: Neurosurgery;  Laterality: N/A;  LAMINECTOMY CERVICAL FOUR - CERVICAL FIVE, CERVICAL FIVE - CERVICAL SIX, CERVICAL SIX - CERVICAL SEVEN, POSTERIOR SEGMENTAL        Home Medications    Prior to Admission medications   Medication Sig Start Date End Date Taking? Authorizing Provider  alfuzosin (UROXATRAL) 10 MG 24 hr tablet Take 10 mg by mouth daily. 04/01/17  Yes [provider]  aspirin 325 MG tablet Take 2 tablets (650 mg total) by mouth every 6 (six) hours as needed for mild pain. For pain 09/16/17  Yes Costella, Vista Mink, PA-C  methocarbamol (ROBAXIN) 500 MG tablet Take 1 tablet (500 mg total) by mouth every 6 (six) hours as needed for muscle spasms. 09/13/17  Yes Costella, Vista Mink, PA-C  Multiple Vitamins-Minerals (MULTIVITAMIN WITH MINERALS) tablet Take 1 tablet by mouth daily.   Yes [provider]  oxyCODONE-acetaminophen (PERCOCET/ROXICET) 5-325 MG tablet Take 1-2 tablets by mouth every 4 (four) hours as needed for moderate pain or severe pain. 09/13/17  Yes Costella, Vista Mink, PA-C  rosuvastatin (CRESTOR) 20 MG tablet Take 20 mg by mouth daily. 08/14/17  Yes [provider]  traMADol (ULTRAM) 50 MG tablet Take 1-2 tablets (50-100 mg total) by mouth every 6 (six) hours as needed for moderate pain or severe pain. 09/13/17  Yes Costella, Evette Doffing  J, PA-C  cilostazol (PLETAL) 50 MG tablet Take 1 tablet by mouth 2 (two) times daily. 08/19/17   [provider]  ketorolac (TORADOL) 10 MG tablet Take 1 tablet (10 mg total) by mouth every 6 (six) hours for 5 days. 09/19/17 09/24/17  Lennice Sites, DO    Family History Family History  Problem Relation Age of Onset  . Diabetes Mother   . Lupus Mother   . Prostate cancer Father     Social History Social History    Tobacco Use  . Smoking status: Former Smoker    Types: Cigarettes    Last attempt to quit: 03/21/2014    Years since quitting: 3.5  . Smokeless tobacco: Never Used  Substance Use Topics  . Alcohol use: Yes    Comment: occ  . Drug use: No     Allergies   Augmentin [amoxicillin-pot clavulanate]; Hydrocodone; and Oxycontin [oxycodone hcl]   Review of Systems Review of Systems  Constitutional: Negative for chills and fever.  HENT: Negative for ear pain and sore throat.   Eyes: Negative for pain and visual disturbance.  Respiratory: Negative for cough and shortness of breath.   Cardiovascular: Negative for chest pain and palpitations.  Gastrointestinal: Negative for abdominal pain and vomiting.  Genitourinary: Negative for dysuria and hematuria.  Musculoskeletal: Positive for neck pain and neck stiffness. Negative for arthralgias and back pain.  Skin: Negative for color change and rash.  Neurological: Negative for seizures, syncope and headaches.  All other systems reviewed and are negative.    Physical Exam Updated Vital Signs  ED Triage Vitals  Enc Vitals Group     BP 09/19/17 2214 (!) 187/76     Pulse Rate 09/19/17 2214 85     Resp 09/19/17 2214 16     Temp 09/19/17 2214 (!) 97.4 F (36.3 C)     Temp src --      SpO2 09/19/17 2214 98 %     Weight 09/19/17 2216 210 lb (95.3 kg)     Height 09/19/17 2216 5\' 11"  (1.803 m)     Head Circumference --      Peak Flow --      Pain Score 09/19/17 2216 9     Pain Loc --      Pain Edu? --      Excl. in [place]? --     Physical Exam  Constitutional: He is oriented to person, place, and time. He appears well-developed and well-nourished.  HENT:  Head: Normocephalic and atraumatic.  Eyes: Pupils are equal, round, and reactive to light. Conjunctivae and EOM are normal.  Neck: Neck supple.  Well-healing surgical site, no signs of infection, staples in place  Cardiovascular: Normal rate, regular rhythm, normal heart sounds and  intact distal pulses.  No murmur heard. Pulmonary/Chest: Effort normal and breath sounds normal. No respiratory distress.  Abdominal: Soft. There is no tenderness.  Musculoskeletal: Normal range of motion. He exhibits no edema.  Neurological: He is alert and oriented to person, place, and time. No cranial nerve deficit or sensory deficit. He exhibits normal muscle tone. Coordination normal.  Mild decreased grip strength in the right hand which is baseline per patient, otherwise 5+/5 strength throughout, normal sensation  Skin: Skin is warm and dry. Capillary refill takes less than 2 seconds.  Psychiatric: He has a normal mood and affect.  Nursing note and vitals reviewed.  ED Treatments / Results  Labs (all labs ordered are listed, but only abnormal results are displayed)  Labs Reviewed - No data to display  EKG None  Radiology No results found.  Procedures Procedures (including critical care time)  Medications Ordered in ED Medications  ketorolac (TORADOL) 30 MG/ML injection 30 mg (30 mg Intramuscular Given 09/19/17 2322)     Initial Impression / Assessment and Plan / ED Course  I have reviewed the triage vital signs and the nursing notes.  Pertinent labs & imaging results that were available during my care of the patient were reviewed by me and considered in my medical decision making (see chart for details).     Grant Ciccarelli Man. is a 72 year old male with history of reflux, recent cervical laminectomy 1 week ago with neurosurgery.  Patient with unremarkable vitals, no fever. Patient here as he is upset with his outpatient pain management.  Patient has been taking narcotics and muscle relaxant without much relief.  Patient denies any new symptoms such as numbness, tingling, weakness.  No new trauma.  Surgical site is well appearing and well healing.  No signs of infection.  Neuro exam is overall unremarkable.  Has some grip weakness in the right hand that he states is  baseline.  Patient mostly here to talk about pain management as he has been unable to contact neurosurgery doctor for further instructions.  After long discussion with family and with Grant [name] with Neurosurgery with whom I talked on the phone with, we will discontinue narcotic pain medicine and schedule Tylenol 1000 mg every 6 hours for the next several days and start toradol for the next 5 days as well.  Family was initially concerned about patient's mental health secondary to pain but after further discussion patient is not suicidal, not homicidal.  Patient just frustrated but now is happy with new pain plan.  Patient given IM Toradol shot here in the ED.  Patient will follow up with neurosurgery sooner and will discuss further pain management.  Patient was encouraged to continue to wear cervical collar and was discharged from ED in good condition.  Told to return to the ED if symptoms worsen.  Final Clinical Impressions(s) / ED Diagnoses   Final diagnoses:  Post-op pain    ED Discharge Orders         Ordered    ketorolac (TORADOL) 10 MG tablet  Every 6 hours     09/19/17 2316           Lennice Sites, DO 09/19/17 2340

## 2017-09-19 NOTE — Discharge Instructions (Addendum)
Take 1000 mg of Tylenol every 6 hours for the next 3 to 5 days and then as needed.  Follow-up with neurosurgery and call for earlier appointment.  Please discontinue on narcotic pain medicine.

## 2017-09-22 DIAGNOSIS — R69 Illness, unspecified: Secondary | ICD-10-CM | POA: Diagnosis not present

## 2017-09-22 DIAGNOSIS — M4802 Spinal stenosis, cervical region: Secondary | ICD-10-CM | POA: Diagnosis not present

## 2017-09-22 DIAGNOSIS — Z9889 Other specified postprocedural states: Secondary | ICD-10-CM | POA: Diagnosis not present

## 2017-09-22 DIAGNOSIS — Z6827 Body mass index (BMI) 27.0-27.9, adult: Secondary | ICD-10-CM | POA: Diagnosis not present

## 2017-09-29 DIAGNOSIS — Z6827 Body mass index (BMI) 27.0-27.9, adult: Secondary | ICD-10-CM | POA: Diagnosis not present

## 2017-09-29 DIAGNOSIS — I10 Essential (primary) hypertension: Secondary | ICD-10-CM | POA: Diagnosis not present

## 2017-09-29 DIAGNOSIS — M4802 Spinal stenosis, cervical region: Secondary | ICD-10-CM | POA: Diagnosis not present

## 2017-10-11 DIAGNOSIS — C61 Malignant neoplasm of prostate: Secondary | ICD-10-CM | POA: Diagnosis not present

## 2017-10-12 DIAGNOSIS — Z9889 Other specified postprocedural states: Secondary | ICD-10-CM | POA: Diagnosis not present

## 2017-10-12 DIAGNOSIS — E782 Mixed hyperlipidemia: Secondary | ICD-10-CM | POA: Diagnosis not present

## 2017-10-12 DIAGNOSIS — G9009 Other idiopathic peripheral autonomic neuropathy: Secondary | ICD-10-CM | POA: Diagnosis not present

## 2017-10-12 DIAGNOSIS — E785 Hyperlipidemia, unspecified: Secondary | ICD-10-CM | POA: Diagnosis not present

## 2017-10-12 DIAGNOSIS — R079 Chest pain, unspecified: Secondary | ICD-10-CM | POA: Diagnosis not present

## 2017-10-12 DIAGNOSIS — R69 Illness, unspecified: Secondary | ICD-10-CM | POA: Diagnosis not present

## 2017-10-12 DIAGNOSIS — E119 Type 2 diabetes mellitus without complications: Secondary | ICD-10-CM | POA: Diagnosis not present

## 2017-10-12 DIAGNOSIS — R7301 Impaired fasting glucose: Secondary | ICD-10-CM | POA: Diagnosis not present

## 2017-10-13 ENCOUNTER — Ambulatory Visit: Payer: Medicare HMO | Admitting: Urology

## 2017-10-13 DIAGNOSIS — R351 Nocturia: Secondary | ICD-10-CM

## 2017-10-13 DIAGNOSIS — C61 Malignant neoplasm of prostate: Secondary | ICD-10-CM

## 2017-10-13 DIAGNOSIS — N401 Enlarged prostate with lower urinary tract symptoms: Secondary | ICD-10-CM | POA: Diagnosis not present

## 2017-10-15 DIAGNOSIS — C61 Malignant neoplasm of prostate: Secondary | ICD-10-CM | POA: Diagnosis not present

## 2017-10-15 DIAGNOSIS — M5030 Other cervical disc degeneration, unspecified cervical region: Secondary | ICD-10-CM | POA: Diagnosis not present

## 2017-10-15 DIAGNOSIS — R202 Paresthesia of skin: Secondary | ICD-10-CM | POA: Diagnosis not present

## 2017-10-15 DIAGNOSIS — R531 Weakness: Secondary | ICD-10-CM | POA: Diagnosis not present

## 2017-10-15 DIAGNOSIS — G9009 Other idiopathic peripheral autonomic neuropathy: Secondary | ICD-10-CM | POA: Diagnosis not present

## 2017-10-20 DIAGNOSIS — R202 Paresthesia of skin: Secondary | ICD-10-CM | POA: Diagnosis not present

## 2017-10-20 DIAGNOSIS — G9009 Other idiopathic peripheral autonomic neuropathy: Secondary | ICD-10-CM | POA: Diagnosis not present

## 2017-10-20 DIAGNOSIS — M25541 Pain in joints of right hand: Secondary | ICD-10-CM | POA: Diagnosis not present

## 2017-10-20 DIAGNOSIS — M6281 Muscle weakness (generalized): Secondary | ICD-10-CM | POA: Diagnosis not present

## 2017-10-21 DIAGNOSIS — R202 Paresthesia of skin: Secondary | ICD-10-CM | POA: Diagnosis not present

## 2017-10-21 DIAGNOSIS — M6281 Muscle weakness (generalized): Secondary | ICD-10-CM | POA: Diagnosis not present

## 2017-10-21 DIAGNOSIS — G9009 Other idiopathic peripheral autonomic neuropathy: Secondary | ICD-10-CM | POA: Diagnosis not present

## 2017-10-21 DIAGNOSIS — M25541 Pain in joints of right hand: Secondary | ICD-10-CM | POA: Diagnosis not present

## 2017-10-22 DIAGNOSIS — E119 Type 2 diabetes mellitus without complications: Secondary | ICD-10-CM | POA: Diagnosis not present

## 2017-10-22 DIAGNOSIS — R7301 Impaired fasting glucose: Secondary | ICD-10-CM | POA: Diagnosis not present

## 2017-10-22 DIAGNOSIS — Z23 Encounter for immunization: Secondary | ICD-10-CM | POA: Diagnosis not present

## 2017-10-22 DIAGNOSIS — E782 Mixed hyperlipidemia: Secondary | ICD-10-CM | POA: Diagnosis not present

## 2017-10-22 DIAGNOSIS — E785 Hyperlipidemia, unspecified: Secondary | ICD-10-CM | POA: Diagnosis not present

## 2017-11-03 DIAGNOSIS — M6281 Muscle weakness (generalized): Secondary | ICD-10-CM | POA: Diagnosis not present

## 2017-11-03 DIAGNOSIS — R202 Paresthesia of skin: Secondary | ICD-10-CM | POA: Diagnosis not present

## 2017-11-03 DIAGNOSIS — G9009 Other idiopathic peripheral autonomic neuropathy: Secondary | ICD-10-CM | POA: Diagnosis not present

## 2017-11-03 DIAGNOSIS — M25541 Pain in joints of right hand: Secondary | ICD-10-CM | POA: Diagnosis not present

## 2017-11-08 DIAGNOSIS — L600 Ingrowing nail: Secondary | ICD-10-CM | POA: Diagnosis not present

## 2017-11-08 DIAGNOSIS — L03032 Cellulitis of left toe: Secondary | ICD-10-CM | POA: Diagnosis not present

## 2017-11-08 DIAGNOSIS — L03031 Cellulitis of right toe: Secondary | ICD-10-CM | POA: Diagnosis not present

## 2017-11-08 DIAGNOSIS — M79671 Pain in right foot: Secondary | ICD-10-CM | POA: Diagnosis not present

## 2017-11-17 DIAGNOSIS — R69 Illness, unspecified: Secondary | ICD-10-CM | POA: Diagnosis not present

## 2017-11-17 DIAGNOSIS — E119 Type 2 diabetes mellitus without complications: Secondary | ICD-10-CM | POA: Diagnosis not present

## 2017-11-17 DIAGNOSIS — Z9889 Other specified postprocedural states: Secondary | ICD-10-CM | POA: Diagnosis not present

## 2017-11-17 DIAGNOSIS — M4802 Spinal stenosis, cervical region: Secondary | ICD-10-CM | POA: Diagnosis not present

## 2017-11-17 DIAGNOSIS — M5030 Other cervical disc degeneration, unspecified cervical region: Secondary | ICD-10-CM | POA: Diagnosis not present

## 2017-11-17 DIAGNOSIS — M25541 Pain in joints of right hand: Secondary | ICD-10-CM | POA: Diagnosis not present

## 2017-11-17 DIAGNOSIS — G9009 Other idiopathic peripheral autonomic neuropathy: Secondary | ICD-10-CM | POA: Diagnosis not present

## 2017-11-17 DIAGNOSIS — M6281 Muscle weakness (generalized): Secondary | ICD-10-CM | POA: Diagnosis not present

## 2017-11-17 DIAGNOSIS — Z6827 Body mass index (BMI) 27.0-27.9, adult: Secondary | ICD-10-CM | POA: Diagnosis not present

## 2017-11-17 DIAGNOSIS — R202 Paresthesia of skin: Secondary | ICD-10-CM | POA: Diagnosis not present

## 2017-11-24 DIAGNOSIS — Z8546 Personal history of malignant neoplasm of prostate: Secondary | ICD-10-CM | POA: Diagnosis not present

## 2017-11-24 DIAGNOSIS — G9009 Other idiopathic peripheral autonomic neuropathy: Secondary | ICD-10-CM | POA: Diagnosis not present

## 2017-11-24 DIAGNOSIS — E782 Mixed hyperlipidemia: Secondary | ICD-10-CM | POA: Diagnosis not present

## 2017-11-24 DIAGNOSIS — M25541 Pain in joints of right hand: Secondary | ICD-10-CM | POA: Diagnosis not present

## 2017-11-24 DIAGNOSIS — M4322 Fusion of spine, cervical region: Secondary | ICD-10-CM | POA: Diagnosis not present

## 2017-11-24 DIAGNOSIS — I70213 Atherosclerosis of native arteries of extremities with intermittent claudication, bilateral legs: Secondary | ICD-10-CM | POA: Diagnosis not present

## 2017-11-24 DIAGNOSIS — Z23 Encounter for immunization: Secondary | ICD-10-CM | POA: Diagnosis not present

## 2017-11-24 DIAGNOSIS — R202 Paresthesia of skin: Secondary | ICD-10-CM | POA: Diagnosis not present

## 2017-11-24 DIAGNOSIS — E1169 Type 2 diabetes mellitus with other specified complication: Secondary | ICD-10-CM | POA: Diagnosis not present

## 2017-11-24 DIAGNOSIS — D649 Anemia, unspecified: Secondary | ICD-10-CM | POA: Diagnosis not present

## 2017-11-24 DIAGNOSIS — M6281 Muscle weakness (generalized): Secondary | ICD-10-CM | POA: Diagnosis not present

## 2018-01-24 DIAGNOSIS — E1151 Type 2 diabetes mellitus with diabetic peripheral angiopathy without gangrene: Secondary | ICD-10-CM | POA: Diagnosis not present

## 2018-01-24 DIAGNOSIS — M79671 Pain in right foot: Secondary | ICD-10-CM | POA: Diagnosis not present

## 2018-01-24 DIAGNOSIS — M79672 Pain in left foot: Secondary | ICD-10-CM | POA: Diagnosis not present

## 2018-02-21 ENCOUNTER — Ambulatory Visit: Payer: Self-pay | Admitting: Cardiology

## 2018-02-21 NOTE — Progress Notes (Deleted)
   Subjective:    Patient ID: Grant Most., male    DOB: [birth year], 73 y.o.   MRN: [id number]  HPI    Review of Systems     Objective:   Physical Exam        Assessment & Plan:

## 2018-03-07 DIAGNOSIS — R69 Illness, unspecified: Secondary | ICD-10-CM | POA: Diagnosis not present

## 2018-03-07 DIAGNOSIS — E785 Hyperlipidemia, unspecified: Secondary | ICD-10-CM | POA: Diagnosis not present

## 2018-03-07 DIAGNOSIS — Z9889 Other specified postprocedural states: Secondary | ICD-10-CM | POA: Diagnosis not present

## 2018-03-07 DIAGNOSIS — Z8546 Personal history of malignant neoplasm of prostate: Secondary | ICD-10-CM | POA: Diagnosis not present

## 2018-03-07 DIAGNOSIS — E119 Type 2 diabetes mellitus without complications: Secondary | ICD-10-CM | POA: Diagnosis not present

## 2018-03-07 DIAGNOSIS — G9009 Other idiopathic peripheral autonomic neuropathy: Secondary | ICD-10-CM | POA: Diagnosis not present

## 2018-03-07 DIAGNOSIS — E782 Mixed hyperlipidemia: Secondary | ICD-10-CM | POA: Diagnosis not present

## 2018-03-10 DIAGNOSIS — R7301 Impaired fasting glucose: Secondary | ICD-10-CM | POA: Diagnosis not present

## 2018-03-10 DIAGNOSIS — E785 Hyperlipidemia, unspecified: Secondary | ICD-10-CM | POA: Diagnosis not present

## 2018-03-10 DIAGNOSIS — E782 Mixed hyperlipidemia: Secondary | ICD-10-CM | POA: Diagnosis not present

## 2018-03-10 DIAGNOSIS — Z125 Encounter for screening for malignant neoplasm of prostate: Secondary | ICD-10-CM | POA: Diagnosis not present

## 2018-03-10 DIAGNOSIS — E119 Type 2 diabetes mellitus without complications: Secondary | ICD-10-CM | POA: Diagnosis not present

## 2018-03-11 ENCOUNTER — Ambulatory Visit: Payer: Medicare HMO | Admitting: Cardiology

## 2018-03-11 ENCOUNTER — Encounter: Payer: Self-pay | Admitting: Cardiology

## 2018-03-11 VITALS — BP 149/65 | HR 80 | Ht 71.0 in | Wt 201.0 lb

## 2018-03-11 DIAGNOSIS — I739 Peripheral vascular disease, unspecified: Secondary | ICD-10-CM | POA: Diagnosis not present

## 2018-03-11 DIAGNOSIS — I10 Essential (primary) hypertension: Secondary | ICD-10-CM

## 2018-03-11 DIAGNOSIS — I351 Nonrheumatic aortic (valve) insufficiency: Secondary | ICD-10-CM

## 2018-03-11 NOTE — Progress Notes (Signed)
Patient is here for follow up visit.  Subjective:   Grant [name]., male    DOB: December 22, 1945, 73 y.o.   MRN: [id number]   Chief Complaint  Patient presents with  . Hypertension    LAST OV 08/19/2017   . Claudication  . Hyperlipidemia    HPI  73 year old Caucasian male with hyperlipidemia, hypertension, borderline diabetes, claudication with mildly reduced b/l ABI.  He underwent cervical spine surgery, but continues to have right arm pain. He denies any chest pain or shortness of breath. Claudication symptoms have improved.   He states that he may have peptic ulcer and is currently undergoing workup for the same. He denies any melena.  BP is high today, but states it is always lower at home and PCP appts.   Past Medical History:  Diagnosis Date  . Cancer [facility])    prostate  . Cervical stenosis of spine   . Claudication ([place])   . Claudication ([place])   . Diabetes mellitus without complication ([place])   . GERD (gastroesophageal reflux disease)   . Glaucoma (increased eye pressure)   . Hyperlipidemia   . Hypertension   . Wears glasses      Past Surgical History:  Procedure Laterality Date  . fracture back    . HERNIA REPAIR    . POSTERIOR CERVICAL FUSION/FORAMINOTOMY N/A 09/10/2017   Procedure: LAMINECTOMY CERVICAL FOUR - CERVICAL FIVE, CERVICAL FIVE - CERVICAL SIX, CERVICAL SIX - CERVICAL SEVEN, POSTERIOR SEGMENTAL INSTRUMENTATION, POSTERIOR LATERAL ARTHRODESIS;  Surgeon: Consuella Lose, MD;  Location: [facility];  Service: Neurosurgery;  Laterality: N/A;  LAMINECTOMY CERVICAL FOUR - CERVICAL FIVE, CERVICAL FIVE - CERVICAL SIX, CERVICAL SIX - CERVICAL SEVEN, POSTERIOR SEGMENTAL     Social History   Socioeconomic History  . Marital status: Married    Spouse name: Not on file  . Number of children: Not on file  . Years of education: Not on file  . Highest education level: Not on file  Occupational History  . Not on file  Social Needs  . Financial resource  strain: Not on file  . Food insecurity:    Worry: Not on file    Inability: Not on file  . Transportation needs:    Medical: Not on file    Non-medical: Not on file  Tobacco Use  . Smoking status: Former Smoker    Types: Cigarettes    Last attempt to quit: 03/21/2014    Years since quitting: 3.9  . Smokeless tobacco: Never Used  Substance and Sexual Activity  . Alcohol use: Yes    Comment: occ  . Drug use: No  . Sexual activity: Never    Birth control/protection: None  Lifestyle  . Physical activity:    Days per week: Not on file    Minutes per session: Not on file  . Stress: Not on file  Relationships  . Social connections:    Talks on phone: Not on file    Gets together: Not on file    Attends religious service: Not on file    Active member of club or organization: Not on file    Attends meetings of clubs or organizations: Not on file    Relationship status: Not on file  . Intimate partner violence:    Fear of current or ex partner: Not on file    Emotionally abused: Not on file    Physically abused: Not on file    Forced sexual activity: Not on file  Other  Topics Concern  . Not on file  Social History Narrative  . Not on file     Current Outpatient Medications on File Prior to Visit  Medication Sig Dispense Refill  . alfuzosin (UROXATRAL) 10 MG 24 hr tablet Take 10 mg by mouth daily.  11  . metFORMIN (GLUCOPHAGE) 500 MG tablet Take 500 mg by mouth 2 (two) times daily.    . Multiple Vitamins-Minerals (MULTIVITAMIN WITH MINERALS) tablet Take 1 tablet by mouth daily.    . nitroGLYCERIN (NITROSTAT) 0.4 MG SL tablet Place 0.4 mg under the tongue every 5 (five) minutes as needed for chest pain.    . rosuvastatin (CRESTOR) 20 MG tablet Take 20 mg by mouth daily.  3  . cilostazol (PLETAL) 50 MG tablet Take 1 tablet by mouth 2 (two) times daily.  5   No current facility-administered medications on file prior to visit.     Cardiovascular studies:  EKG  03/11/2018: Sinus  Rhythm  WITHIN NORMAL LIMITS  Review of Systems  Constitution: Negative for decreased appetite, malaise/fatigue, weight gain and weight loss.  HENT: Negative for congestion.   Eyes: Negative for visual disturbance.  Cardiovascular: Positive for claudication (improved). Negative for chest pain, dyspnea on exertion, leg swelling, palpitations and syncope.  Respiratory: Negative for shortness of breath.   Endocrine: Negative for cold intolerance.  Hematologic/Lymphatic: Does not bruise/bleed easily.  Skin: Negative for itching and rash.  Musculoskeletal: Positive for neck pain (Improved). Negative for myalgias.       Right arm pain  Gastrointestinal: Negative for abdominal pain, nausea and vomiting.  Genitourinary: Negative for dysuria.  Neurological: Positive for paresthesias (Improved). Negative for dizziness and weakness.  Psychiatric/Behavioral: The patient is not nervous/anxious.   All other systems reviewed and are negative.      Objective:    Vitals:   03/11/18 1424  BP: (!) 149/65  Pulse: 80  SpO2: 93%     Physical Exam  Constitutional: He is oriented to person, place, and time. He appears well-developed and well-nourished. No distress.  HENT:  Head: Normocephalic and atraumatic.  Eyes: Pupils are equal, round, and reactive to light. Conjunctivae are normal.  Neck: No JVD present.  Cardiovascular: Normal rate and regular rhythm.  Murmur (I/VIearly diastolic murmur LLSB) heard. Pulses:      Dorsalis pedis pulses are 0 on the right side and 0 on the left side.       Posterior tibial pulses are 1+ on the right side and 1+ on the left side.  Pulmonary/Chest: Effort normal and breath sounds normal. He has no wheezes. He has no rales.  Abdominal: Soft. Bowel sounds are normal. There is no rebound.  Musculoskeletal:        General: No edema.  Lymphadenopathy:    He has no cervical adenopathy.  Neurological: He is alert and oriented to person, place,  and time. No cranial nerve deficit.  Skin: Skin is warm and dry.  Psychiatric: He has a normal mood and affect.  Nursing note and vitals reviewed.       Assessment & Recommendations:   73 year old Caucasian male with hyperlipidemia, hypertension, borderline diabetes, claudication with mildly reduced b/l ABI.  1. Nonrheumatic aortic valve insufficiency Asymptomatic. Repeat echocardiogram in a year.  2. Essential hypertension Suboptimal control. Patient is reluctant to start therapy at this time, stating his home BP is lower. This will nee follow up/  3. Claudication ([place]) Symptoms have improved on cilostazol. Given concern for peptic ulcer disease, okay to skip  aspirin. Continue statin, metformin.  I will see him back in 1 year.   Nigel Mormon, MD [facility] Cardiovascular. [place] Pager: [phone] Office: [phone] If no answer Cell [phone]

## 2018-03-16 DIAGNOSIS — E782 Mixed hyperlipidemia: Secondary | ICD-10-CM | POA: Diagnosis not present

## 2018-03-16 DIAGNOSIS — E1169 Type 2 diabetes mellitus with other specified complication: Secondary | ICD-10-CM | POA: Diagnosis not present

## 2018-03-16 DIAGNOSIS — R030 Elevated blood-pressure reading, without diagnosis of hypertension: Secondary | ICD-10-CM | POA: Diagnosis not present

## 2018-03-16 DIAGNOSIS — G9009 Other idiopathic peripheral autonomic neuropathy: Secondary | ICD-10-CM | POA: Diagnosis not present

## 2018-03-16 DIAGNOSIS — I70219 Atherosclerosis of native arteries of extremities with intermittent claudication, unspecified extremity: Secondary | ICD-10-CM | POA: Diagnosis not present

## 2018-03-16 DIAGNOSIS — M4322 Fusion of spine, cervical region: Secondary | ICD-10-CM | POA: Diagnosis not present

## 2018-03-16 DIAGNOSIS — D649 Anemia, unspecified: Secondary | ICD-10-CM | POA: Diagnosis not present

## 2018-04-11 DIAGNOSIS — E1140 Type 2 diabetes mellitus with diabetic neuropathy, unspecified: Secondary | ICD-10-CM | POA: Diagnosis not present

## 2018-04-11 DIAGNOSIS — I739 Peripheral vascular disease, unspecified: Secondary | ICD-10-CM | POA: Diagnosis not present

## 2018-04-11 DIAGNOSIS — M79672 Pain in left foot: Secondary | ICD-10-CM | POA: Diagnosis not present

## 2018-04-11 DIAGNOSIS — M79671 Pain in right foot: Secondary | ICD-10-CM | POA: Diagnosis not present

## 2018-04-13 ENCOUNTER — Ambulatory Visit: Payer: Medicare HMO | Admitting: Urology

## 2018-04-13 ENCOUNTER — Other Ambulatory Visit: Payer: Self-pay

## 2018-04-13 DIAGNOSIS — N401 Enlarged prostate with lower urinary tract symptoms: Secondary | ICD-10-CM | POA: Diagnosis not present

## 2018-04-13 DIAGNOSIS — E1169 Type 2 diabetes mellitus with other specified complication: Secondary | ICD-10-CM | POA: Diagnosis not present

## 2018-04-13 DIAGNOSIS — R69 Illness, unspecified: Secondary | ICD-10-CM | POA: Diagnosis not present

## 2018-04-13 DIAGNOSIS — E782 Mixed hyperlipidemia: Secondary | ICD-10-CM | POA: Diagnosis not present

## 2018-04-13 DIAGNOSIS — R351 Nocturia: Secondary | ICD-10-CM

## 2018-04-13 DIAGNOSIS — I70219 Atherosclerosis of native arteries of extremities with intermittent claudication, unspecified extremity: Secondary | ICD-10-CM | POA: Diagnosis not present

## 2018-04-13 DIAGNOSIS — R030 Elevated blood-pressure reading, without diagnosis of hypertension: Secondary | ICD-10-CM | POA: Diagnosis not present

## 2018-04-13 DIAGNOSIS — G9009 Other idiopathic peripheral autonomic neuropathy: Secondary | ICD-10-CM | POA: Diagnosis not present

## 2018-04-13 DIAGNOSIS — C61 Malignant neoplasm of prostate: Secondary | ICD-10-CM | POA: Diagnosis not present

## 2018-04-13 DIAGNOSIS — D649 Anemia, unspecified: Secondary | ICD-10-CM | POA: Diagnosis not present

## 2018-04-16 DIAGNOSIS — R69 Illness, unspecified: Secondary | ICD-10-CM | POA: Diagnosis not present

## 2018-04-18 ENCOUNTER — Other Ambulatory Visit: Payer: Self-pay

## 2018-04-18 MED ORDER — CILOSTAZOL 50 MG PO TABS: 50.0000 mg | ORAL_TABLET | Freq: Two times a day (BID) | ORAL | 1 refills | Status: DC

## 2018-04-22 DIAGNOSIS — D649 Anemia, unspecified: Secondary | ICD-10-CM | POA: Diagnosis not present

## 2018-04-22 DIAGNOSIS — E1169 Type 2 diabetes mellitus with other specified complication: Secondary | ICD-10-CM | POA: Diagnosis not present

## 2018-04-22 DIAGNOSIS — R030 Elevated blood-pressure reading, without diagnosis of hypertension: Secondary | ICD-10-CM | POA: Diagnosis not present

## 2018-04-22 DIAGNOSIS — I70219 Atherosclerosis of native arteries of extremities with intermittent claudication, unspecified extremity: Secondary | ICD-10-CM | POA: Diagnosis not present

## 2018-04-22 DIAGNOSIS — G9009 Other idiopathic peripheral autonomic neuropathy: Secondary | ICD-10-CM | POA: Diagnosis not present

## 2018-04-22 DIAGNOSIS — E782 Mixed hyperlipidemia: Secondary | ICD-10-CM | POA: Diagnosis not present

## 2018-05-09 ENCOUNTER — Other Ambulatory Visit: Payer: Self-pay

## 2018-05-09 DIAGNOSIS — M79641 Pain in right hand: Secondary | ICD-10-CM | POA: Diagnosis not present

## 2018-05-09 DIAGNOSIS — M25641 Stiffness of right hand, not elsewhere classified: Secondary | ICD-10-CM | POA: Diagnosis not present

## 2018-05-09 DIAGNOSIS — M542 Cervicalgia: Secondary | ICD-10-CM | POA: Diagnosis not present

## 2018-05-09 DIAGNOSIS — M24541 Contracture, right hand: Secondary | ICD-10-CM | POA: Diagnosis not present

## 2018-05-09 DIAGNOSIS — E7800 Pure hypercholesterolemia, unspecified: Secondary | ICD-10-CM

## 2018-05-09 MED ORDER — ROSUVASTATIN CALCIUM 20 MG PO TABS: 20.0000 mg | ORAL_TABLET | Freq: Every day | ORAL | 3 refills | Status: AC

## 2018-05-10 DIAGNOSIS — Z0000 Encounter for general adult medical examination without abnormal findings: Secondary | ICD-10-CM | POA: Diagnosis not present

## 2018-06-06 DIAGNOSIS — E782 Mixed hyperlipidemia: Secondary | ICD-10-CM | POA: Diagnosis not present

## 2018-06-06 DIAGNOSIS — D649 Anemia, unspecified: Secondary | ICD-10-CM | POA: Diagnosis not present

## 2018-06-06 DIAGNOSIS — G9009 Other idiopathic peripheral autonomic neuropathy: Secondary | ICD-10-CM | POA: Diagnosis not present

## 2018-06-06 DIAGNOSIS — E1169 Type 2 diabetes mellitus with other specified complication: Secondary | ICD-10-CM | POA: Diagnosis not present

## 2018-06-21 DIAGNOSIS — E782 Mixed hyperlipidemia: Secondary | ICD-10-CM | POA: Diagnosis not present

## 2018-06-21 DIAGNOSIS — D649 Anemia, unspecified: Secondary | ICD-10-CM | POA: Diagnosis not present

## 2018-06-21 DIAGNOSIS — G9009 Other idiopathic peripheral autonomic neuropathy: Secondary | ICD-10-CM | POA: Diagnosis not present

## 2018-06-21 DIAGNOSIS — R030 Elevated blood-pressure reading, without diagnosis of hypertension: Secondary | ICD-10-CM | POA: Diagnosis not present

## 2018-06-21 DIAGNOSIS — I70219 Atherosclerosis of native arteries of extremities with intermittent claudication, unspecified extremity: Secondary | ICD-10-CM | POA: Diagnosis not present

## 2018-06-21 DIAGNOSIS — E1169 Type 2 diabetes mellitus with other specified complication: Secondary | ICD-10-CM | POA: Diagnosis not present

## 2018-07-01 DIAGNOSIS — R69 Illness, unspecified: Secondary | ICD-10-CM | POA: Diagnosis not present

## 2018-07-04 DIAGNOSIS — M79672 Pain in left foot: Secondary | ICD-10-CM | POA: Diagnosis not present

## 2018-07-04 DIAGNOSIS — I739 Peripheral vascular disease, unspecified: Secondary | ICD-10-CM | POA: Diagnosis not present

## 2018-07-04 DIAGNOSIS — M79671 Pain in right foot: Secondary | ICD-10-CM | POA: Diagnosis not present

## 2018-07-04 DIAGNOSIS — E1140 Type 2 diabetes mellitus with diabetic neuropathy, unspecified: Secondary | ICD-10-CM | POA: Diagnosis not present

## 2018-07-12 DIAGNOSIS — R69 Illness, unspecified: Secondary | ICD-10-CM | POA: Diagnosis not present

## 2018-07-26 DIAGNOSIS — E782 Mixed hyperlipidemia: Secondary | ICD-10-CM | POA: Diagnosis not present

## 2018-07-26 DIAGNOSIS — R7301 Impaired fasting glucose: Secondary | ICD-10-CM | POA: Diagnosis not present

## 2018-07-26 DIAGNOSIS — E785 Hyperlipidemia, unspecified: Secondary | ICD-10-CM | POA: Diagnosis not present

## 2018-07-26 DIAGNOSIS — E119 Type 2 diabetes mellitus without complications: Secondary | ICD-10-CM | POA: Diagnosis not present

## 2018-07-26 DIAGNOSIS — D649 Anemia, unspecified: Secondary | ICD-10-CM | POA: Diagnosis not present

## 2018-07-26 DIAGNOSIS — E1169 Type 2 diabetes mellitus with other specified complication: Secondary | ICD-10-CM | POA: Diagnosis not present

## 2018-08-01 DIAGNOSIS — E1169 Type 2 diabetes mellitus with other specified complication: Secondary | ICD-10-CM | POA: Diagnosis not present

## 2018-08-01 DIAGNOSIS — I739 Peripheral vascular disease, unspecified: Secondary | ICD-10-CM | POA: Diagnosis not present

## 2018-08-01 DIAGNOSIS — Z8546 Personal history of malignant neoplasm of prostate: Secondary | ICD-10-CM | POA: Diagnosis not present

## 2018-08-01 DIAGNOSIS — D649 Anemia, unspecified: Secondary | ICD-10-CM | POA: Diagnosis not present

## 2018-08-01 DIAGNOSIS — G9009 Other idiopathic peripheral autonomic neuropathy: Secondary | ICD-10-CM | POA: Diagnosis not present

## 2018-08-01 DIAGNOSIS — E782 Mixed hyperlipidemia: Secondary | ICD-10-CM | POA: Diagnosis not present

## 2018-08-01 DIAGNOSIS — R030 Elevated blood-pressure reading, without diagnosis of hypertension: Secondary | ICD-10-CM | POA: Diagnosis not present

## 2018-08-01 DIAGNOSIS — I70219 Atherosclerosis of native arteries of extremities with intermittent claudication, unspecified extremity: Secondary | ICD-10-CM | POA: Diagnosis not present

## 2018-08-01 DIAGNOSIS — M4322 Fusion of spine, cervical region: Secondary | ICD-10-CM | POA: Diagnosis not present

## 2018-08-03 DIAGNOSIS — G9009 Other idiopathic peripheral autonomic neuropathy: Secondary | ICD-10-CM | POA: Diagnosis not present

## 2018-08-03 DIAGNOSIS — E782 Mixed hyperlipidemia: Secondary | ICD-10-CM | POA: Diagnosis not present

## 2018-08-03 DIAGNOSIS — R030 Elevated blood-pressure reading, without diagnosis of hypertension: Secondary | ICD-10-CM | POA: Diagnosis not present

## 2018-08-03 DIAGNOSIS — E1169 Type 2 diabetes mellitus with other specified complication: Secondary | ICD-10-CM | POA: Diagnosis not present

## 2018-08-03 DIAGNOSIS — D649 Anemia, unspecified: Secondary | ICD-10-CM | POA: Diagnosis not present

## 2018-08-03 DIAGNOSIS — I70219 Atherosclerosis of native arteries of extremities with intermittent claudication, unspecified extremity: Secondary | ICD-10-CM | POA: Diagnosis not present

## 2018-08-08 DIAGNOSIS — M25641 Stiffness of right hand, not elsewhere classified: Secondary | ICD-10-CM | POA: Diagnosis not present

## 2018-08-08 DIAGNOSIS — R202 Paresthesia of skin: Secondary | ICD-10-CM | POA: Diagnosis not present

## 2018-08-08 DIAGNOSIS — M25541 Pain in joints of right hand: Secondary | ICD-10-CM | POA: Diagnosis not present

## 2018-08-08 DIAGNOSIS — G9009 Other idiopathic peripheral autonomic neuropathy: Secondary | ICD-10-CM | POA: Diagnosis not present

## 2018-08-08 DIAGNOSIS — M6281 Muscle weakness (generalized): Secondary | ICD-10-CM | POA: Diagnosis not present

## 2018-08-19 DIAGNOSIS — M25541 Pain in joints of right hand: Secondary | ICD-10-CM | POA: Diagnosis not present

## 2018-08-19 DIAGNOSIS — G9009 Other idiopathic peripheral autonomic neuropathy: Secondary | ICD-10-CM | POA: Diagnosis not present

## 2018-08-19 DIAGNOSIS — R202 Paresthesia of skin: Secondary | ICD-10-CM | POA: Diagnosis not present

## 2018-08-19 DIAGNOSIS — M6281 Muscle weakness (generalized): Secondary | ICD-10-CM | POA: Diagnosis not present

## 2018-08-19 DIAGNOSIS — M25641 Stiffness of right hand, not elsewhere classified: Secondary | ICD-10-CM | POA: Diagnosis not present

## 2018-08-22 DIAGNOSIS — K219 Gastro-esophageal reflux disease without esophagitis: Secondary | ICD-10-CM | POA: Diagnosis not present

## 2018-08-22 DIAGNOSIS — R197 Diarrhea, unspecified: Secondary | ICD-10-CM | POA: Diagnosis not present

## 2018-09-05 DIAGNOSIS — R202 Paresthesia of skin: Secondary | ICD-10-CM | POA: Diagnosis not present

## 2018-09-05 DIAGNOSIS — M25541 Pain in joints of right hand: Secondary | ICD-10-CM | POA: Diagnosis not present

## 2018-09-05 DIAGNOSIS — M6281 Muscle weakness (generalized): Secondary | ICD-10-CM | POA: Diagnosis not present

## 2018-09-05 DIAGNOSIS — G9009 Other idiopathic peripheral autonomic neuropathy: Secondary | ICD-10-CM | POA: Diagnosis not present

## 2018-09-05 DIAGNOSIS — M25641 Stiffness of right hand, not elsewhere classified: Secondary | ICD-10-CM | POA: Diagnosis not present

## 2018-09-14 DIAGNOSIS — M25541 Pain in joints of right hand: Secondary | ICD-10-CM | POA: Diagnosis not present

## 2018-09-14 DIAGNOSIS — G9009 Other idiopathic peripheral autonomic neuropathy: Secondary | ICD-10-CM | POA: Diagnosis not present

## 2018-09-14 DIAGNOSIS — M6281 Muscle weakness (generalized): Secondary | ICD-10-CM | POA: Diagnosis not present

## 2018-09-14 DIAGNOSIS — R202 Paresthesia of skin: Secondary | ICD-10-CM | POA: Diagnosis not present

## 2018-09-14 DIAGNOSIS — M25641 Stiffness of right hand, not elsewhere classified: Secondary | ICD-10-CM | POA: Diagnosis not present

## 2018-09-20 DIAGNOSIS — R197 Diarrhea, unspecified: Secondary | ICD-10-CM | POA: Diagnosis not present

## 2018-09-20 DIAGNOSIS — K219 Gastro-esophageal reflux disease without esophagitis: Secondary | ICD-10-CM | POA: Diagnosis not present

## 2018-09-28 DIAGNOSIS — R69 Illness, unspecified: Secondary | ICD-10-CM | POA: Diagnosis not present

## 2018-09-30 DIAGNOSIS — R69 Illness, unspecified: Secondary | ICD-10-CM | POA: Diagnosis not present

## 2018-09-30 DIAGNOSIS — D649 Anemia, unspecified: Secondary | ICD-10-CM | POA: Diagnosis not present

## 2018-09-30 DIAGNOSIS — K219 Gastro-esophageal reflux disease without esophagitis: Secondary | ICD-10-CM | POA: Diagnosis not present

## 2018-09-30 DIAGNOSIS — G9009 Other idiopathic peripheral autonomic neuropathy: Secondary | ICD-10-CM | POA: Diagnosis not present

## 2018-09-30 DIAGNOSIS — R079 Chest pain, unspecified: Secondary | ICD-10-CM | POA: Diagnosis not present

## 2018-09-30 DIAGNOSIS — R7301 Impaired fasting glucose: Secondary | ICD-10-CM | POA: Diagnosis not present

## 2018-09-30 DIAGNOSIS — C61 Malignant neoplasm of prostate: Secondary | ICD-10-CM | POA: Diagnosis not present

## 2018-09-30 DIAGNOSIS — R070 Pain in throat: Secondary | ICD-10-CM | POA: Diagnosis not present

## 2018-09-30 DIAGNOSIS — E119 Type 2 diabetes mellitus without complications: Secondary | ICD-10-CM | POA: Diagnosis not present

## 2018-09-30 DIAGNOSIS — R509 Fever, unspecified: Secondary | ICD-10-CM | POA: Diagnosis not present

## 2018-09-30 DIAGNOSIS — R51 Headache: Secondary | ICD-10-CM | POA: Diagnosis not present

## 2018-09-30 DIAGNOSIS — R030 Elevated blood-pressure reading, without diagnosis of hypertension: Secondary | ICD-10-CM | POA: Diagnosis not present

## 2018-10-03 DIAGNOSIS — M79671 Pain in right foot: Secondary | ICD-10-CM | POA: Diagnosis not present

## 2018-10-03 DIAGNOSIS — E1140 Type 2 diabetes mellitus with diabetic neuropathy, unspecified: Secondary | ICD-10-CM | POA: Diagnosis not present

## 2018-10-03 DIAGNOSIS — I739 Peripheral vascular disease, unspecified: Secondary | ICD-10-CM | POA: Diagnosis not present

## 2018-10-03 DIAGNOSIS — M79672 Pain in left foot: Secondary | ICD-10-CM | POA: Diagnosis not present

## 2018-10-19 ENCOUNTER — Ambulatory Visit (INDEPENDENT_AMBULATORY_CARE_PROVIDER_SITE_OTHER): Payer: Medicare HMO | Admitting: Urology

## 2018-10-19 DIAGNOSIS — N401 Enlarged prostate with lower urinary tract symptoms: Secondary | ICD-10-CM | POA: Diagnosis not present

## 2018-10-19 DIAGNOSIS — C61 Malignant neoplasm of prostate: Secondary | ICD-10-CM | POA: Diagnosis not present

## 2018-10-19 DIAGNOSIS — R351 Nocturia: Secondary | ICD-10-CM

## 2018-10-21 DIAGNOSIS — I739 Peripheral vascular disease, unspecified: Secondary | ICD-10-CM | POA: Diagnosis not present

## 2018-10-21 DIAGNOSIS — R030 Elevated blood-pressure reading, without diagnosis of hypertension: Secondary | ICD-10-CM | POA: Diagnosis not present

## 2018-10-21 DIAGNOSIS — D649 Anemia, unspecified: Secondary | ICD-10-CM | POA: Diagnosis not present

## 2018-10-21 DIAGNOSIS — E1169 Type 2 diabetes mellitus with other specified complication: Secondary | ICD-10-CM | POA: Diagnosis not present

## 2018-10-21 DIAGNOSIS — I70219 Atherosclerosis of native arteries of extremities with intermittent claudication, unspecified extremity: Secondary | ICD-10-CM | POA: Diagnosis not present

## 2018-10-21 DIAGNOSIS — G9009 Other idiopathic peripheral autonomic neuropathy: Secondary | ICD-10-CM | POA: Diagnosis not present

## 2018-10-21 DIAGNOSIS — M4322 Fusion of spine, cervical region: Secondary | ICD-10-CM | POA: Diagnosis not present

## 2018-10-21 DIAGNOSIS — E782 Mixed hyperlipidemia: Secondary | ICD-10-CM | POA: Diagnosis not present

## 2018-10-21 DIAGNOSIS — Z8546 Personal history of malignant neoplasm of prostate: Secondary | ICD-10-CM | POA: Diagnosis not present

## 2018-10-27 DIAGNOSIS — E782 Mixed hyperlipidemia: Secondary | ICD-10-CM | POA: Diagnosis not present

## 2018-10-27 DIAGNOSIS — D649 Anemia, unspecified: Secondary | ICD-10-CM | POA: Diagnosis not present

## 2018-10-27 DIAGNOSIS — E1169 Type 2 diabetes mellitus with other specified complication: Secondary | ICD-10-CM | POA: Diagnosis not present

## 2018-10-27 DIAGNOSIS — E785 Hyperlipidemia, unspecified: Secondary | ICD-10-CM | POA: Diagnosis not present

## 2018-10-27 DIAGNOSIS — E119 Type 2 diabetes mellitus without complications: Secondary | ICD-10-CM | POA: Diagnosis not present

## 2018-10-28 DIAGNOSIS — E785 Hyperlipidemia, unspecified: Secondary | ICD-10-CM | POA: Diagnosis not present

## 2018-10-28 DIAGNOSIS — D649 Anemia, unspecified: Secondary | ICD-10-CM | POA: Diagnosis not present

## 2018-10-28 DIAGNOSIS — R509 Fever, unspecified: Secondary | ICD-10-CM | POA: Diagnosis not present

## 2018-10-28 DIAGNOSIS — E119 Type 2 diabetes mellitus without complications: Secondary | ICD-10-CM | POA: Diagnosis not present

## 2018-10-28 DIAGNOSIS — I70219 Atherosclerosis of native arteries of extremities with intermittent claudication, unspecified extremity: Secondary | ICD-10-CM | POA: Diagnosis not present

## 2018-10-28 DIAGNOSIS — E782 Mixed hyperlipidemia: Secondary | ICD-10-CM | POA: Diagnosis not present

## 2018-10-28 DIAGNOSIS — G9009 Other idiopathic peripheral autonomic neuropathy: Secondary | ICD-10-CM | POA: Diagnosis not present

## 2018-10-28 DIAGNOSIS — R69 Illness, unspecified: Secondary | ICD-10-CM | POA: Diagnosis not present

## 2018-10-28 DIAGNOSIS — E1169 Type 2 diabetes mellitus with other specified complication: Secondary | ICD-10-CM | POA: Diagnosis not present

## 2018-10-28 DIAGNOSIS — R100 Acute abdomen: Secondary | ICD-10-CM | POA: Diagnosis not present

## 2018-10-28 DIAGNOSIS — C61 Malignant neoplasm of prostate: Secondary | ICD-10-CM | POA: Diagnosis not present

## 2018-10-31 ENCOUNTER — Other Ambulatory Visit: Payer: Self-pay | Admitting: Internal Medicine

## 2018-10-31 ENCOUNTER — Other Ambulatory Visit (HOSPITAL_COMMUNITY): Payer: Self-pay | Admitting: Internal Medicine

## 2018-10-31 DIAGNOSIS — E782 Mixed hyperlipidemia: Secondary | ICD-10-CM | POA: Diagnosis not present

## 2018-10-31 DIAGNOSIS — D649 Anemia, unspecified: Secondary | ICD-10-CM | POA: Diagnosis not present

## 2018-10-31 DIAGNOSIS — E1169 Type 2 diabetes mellitus with other specified complication: Secondary | ICD-10-CM | POA: Diagnosis not present

## 2018-10-31 DIAGNOSIS — R109 Unspecified abdominal pain: Secondary | ICD-10-CM

## 2018-10-31 DIAGNOSIS — R030 Elevated blood-pressure reading, without diagnosis of hypertension: Secondary | ICD-10-CM | POA: Diagnosis not present

## 2018-10-31 DIAGNOSIS — M4322 Fusion of spine, cervical region: Secondary | ICD-10-CM | POA: Diagnosis not present

## 2018-10-31 DIAGNOSIS — Z8546 Personal history of malignant neoplasm of prostate: Secondary | ICD-10-CM | POA: Diagnosis not present

## 2018-10-31 DIAGNOSIS — R110 Nausea: Secondary | ICD-10-CM

## 2018-10-31 DIAGNOSIS — I70219 Atherosclerosis of native arteries of extremities with intermittent claudication, unspecified extremity: Secondary | ICD-10-CM | POA: Diagnosis not present

## 2018-10-31 DIAGNOSIS — Z23 Encounter for immunization: Secondary | ICD-10-CM | POA: Diagnosis not present

## 2018-10-31 DIAGNOSIS — G9009 Other idiopathic peripheral autonomic neuropathy: Secondary | ICD-10-CM | POA: Diagnosis not present

## 2018-10-31 DIAGNOSIS — I739 Peripheral vascular disease, unspecified: Secondary | ICD-10-CM | POA: Diagnosis not present

## 2018-10-31 DIAGNOSIS — R509 Fever, unspecified: Secondary | ICD-10-CM

## 2018-11-07 ENCOUNTER — Other Ambulatory Visit: Payer: Self-pay

## 2018-11-07 ENCOUNTER — Ambulatory Visit (HOSPITAL_COMMUNITY)
Admission: RE | Admit: 2018-11-07 | Discharge: 2018-11-07 | Disposition: A | Discharge status: Home / Self Care | Payer: Medicare HMO | Source: Ambulatory Visit | Attending: Internal Medicine | Admitting: Internal Medicine

## 2018-11-07 DIAGNOSIS — K7689 Other specified diseases of liver: Secondary | ICD-10-CM | POA: Diagnosis not present

## 2018-11-07 DIAGNOSIS — R509 Fever, unspecified: Secondary | ICD-10-CM | POA: Diagnosis not present

## 2018-11-07 DIAGNOSIS — R110 Nausea: Secondary | ICD-10-CM | POA: Diagnosis not present

## 2018-11-07 DIAGNOSIS — R109 Unspecified abdominal pain: Secondary | ICD-10-CM | POA: Diagnosis not present

## 2018-11-27 ENCOUNTER — Other Ambulatory Visit: Payer: Self-pay | Admitting: Cardiology

## 2018-12-05 DIAGNOSIS — R69 Illness, unspecified: Secondary | ICD-10-CM | POA: Diagnosis not present

## 2018-12-12 DIAGNOSIS — E1140 Type 2 diabetes mellitus with diabetic neuropathy, unspecified: Secondary | ICD-10-CM | POA: Diagnosis not present

## 2018-12-12 DIAGNOSIS — M79671 Pain in right foot: Secondary | ICD-10-CM | POA: Diagnosis not present

## 2018-12-12 DIAGNOSIS — I739 Peripheral vascular disease, unspecified: Secondary | ICD-10-CM | POA: Diagnosis not present

## 2018-12-12 DIAGNOSIS — M79672 Pain in left foot: Secondary | ICD-10-CM | POA: Diagnosis not present

## 2018-12-29 DIAGNOSIS — D649 Anemia, unspecified: Secondary | ICD-10-CM | POA: Diagnosis not present

## 2018-12-29 DIAGNOSIS — I70219 Atherosclerosis of native arteries of extremities with intermittent claudication, unspecified extremity: Secondary | ICD-10-CM | POA: Diagnosis not present

## 2018-12-29 DIAGNOSIS — E7849 Other hyperlipidemia: Secondary | ICD-10-CM | POA: Diagnosis not present

## 2018-12-29 DIAGNOSIS — R030 Elevated blood-pressure reading, without diagnosis of hypertension: Secondary | ICD-10-CM | POA: Diagnosis not present

## 2018-12-29 DIAGNOSIS — G9009 Other idiopathic peripheral autonomic neuropathy: Secondary | ICD-10-CM | POA: Diagnosis not present

## 2018-12-29 DIAGNOSIS — E1169 Type 2 diabetes mellitus with other specified complication: Secondary | ICD-10-CM | POA: Diagnosis not present

## 2019-01-02 DIAGNOSIS — K219 Gastro-esophageal reflux disease without esophagitis: Secondary | ICD-10-CM | POA: Diagnosis not present

## 2019-01-02 DIAGNOSIS — Z7984 Long term (current) use of oral hypoglycemic drugs: Secondary | ICD-10-CM | POA: Diagnosis not present

## 2019-01-02 DIAGNOSIS — K5900 Constipation, unspecified: Secondary | ICD-10-CM | POA: Diagnosis not present

## 2019-01-02 DIAGNOSIS — N400 Enlarged prostate without lower urinary tract symptoms: Secondary | ICD-10-CM | POA: Diagnosis not present

## 2019-01-02 DIAGNOSIS — M1990 Unspecified osteoarthritis, unspecified site: Secondary | ICD-10-CM | POA: Diagnosis not present

## 2019-01-02 DIAGNOSIS — E785 Hyperlipidemia, unspecified: Secondary | ICD-10-CM | POA: Diagnosis not present

## 2019-01-02 DIAGNOSIS — R69 Illness, unspecified: Secondary | ICD-10-CM | POA: Diagnosis not present

## 2019-01-02 DIAGNOSIS — C61 Malignant neoplasm of prostate: Secondary | ICD-10-CM | POA: Diagnosis not present

## 2019-01-02 DIAGNOSIS — E1151 Type 2 diabetes mellitus with diabetic peripheral angiopathy without gangrene: Secondary | ICD-10-CM | POA: Diagnosis not present

## 2019-01-02 DIAGNOSIS — Z809 Family history of malignant neoplasm, unspecified: Secondary | ICD-10-CM | POA: Diagnosis not present

## 2019-01-30 DIAGNOSIS — I70219 Atherosclerosis of native arteries of extremities with intermittent claudication, unspecified extremity: Secondary | ICD-10-CM | POA: Diagnosis not present

## 2019-01-30 DIAGNOSIS — R030 Elevated blood-pressure reading, without diagnosis of hypertension: Secondary | ICD-10-CM | POA: Diagnosis not present

## 2019-01-30 DIAGNOSIS — D649 Anemia, unspecified: Secondary | ICD-10-CM | POA: Diagnosis not present

## 2019-01-30 DIAGNOSIS — E1169 Type 2 diabetes mellitus with other specified complication: Secondary | ICD-10-CM | POA: Diagnosis not present

## 2019-01-30 DIAGNOSIS — G9009 Other idiopathic peripheral autonomic neuropathy: Secondary | ICD-10-CM | POA: Diagnosis not present

## 2019-01-30 DIAGNOSIS — E7849 Other hyperlipidemia: Secondary | ICD-10-CM | POA: Diagnosis not present

## 2019-02-01 ENCOUNTER — Other Ambulatory Visit: Payer: Self-pay

## 2019-02-01 DIAGNOSIS — C61 Malignant neoplasm of prostate: Secondary | ICD-10-CM

## 2019-02-09 DIAGNOSIS — E782 Mixed hyperlipidemia: Secondary | ICD-10-CM | POA: Diagnosis not present

## 2019-02-09 DIAGNOSIS — R7301 Impaired fasting glucose: Secondary | ICD-10-CM | POA: Diagnosis not present

## 2019-02-09 DIAGNOSIS — E7849 Other hyperlipidemia: Secondary | ICD-10-CM | POA: Diagnosis not present

## 2019-02-09 DIAGNOSIS — E1169 Type 2 diabetes mellitus with other specified complication: Secondary | ICD-10-CM | POA: Diagnosis not present

## 2019-02-09 DIAGNOSIS — D649 Anemia, unspecified: Secondary | ICD-10-CM | POA: Diagnosis not present

## 2019-02-09 DIAGNOSIS — E119 Type 2 diabetes mellitus without complications: Secondary | ICD-10-CM | POA: Diagnosis not present

## 2019-02-09 DIAGNOSIS — E785 Hyperlipidemia, unspecified: Secondary | ICD-10-CM | POA: Diagnosis not present

## 2019-02-13 DIAGNOSIS — Z8546 Personal history of malignant neoplasm of prostate: Secondary | ICD-10-CM | POA: Diagnosis not present

## 2019-02-13 DIAGNOSIS — R030 Elevated blood-pressure reading, without diagnosis of hypertension: Secondary | ICD-10-CM | POA: Diagnosis not present

## 2019-02-13 DIAGNOSIS — D649 Anemia, unspecified: Secondary | ICD-10-CM | POA: Diagnosis not present

## 2019-02-13 DIAGNOSIS — M5010 Cervical disc disorder with radiculopathy, unspecified cervical region: Secondary | ICD-10-CM | POA: Diagnosis not present

## 2019-02-13 DIAGNOSIS — K219 Gastro-esophageal reflux disease without esophagitis: Secondary | ICD-10-CM | POA: Diagnosis not present

## 2019-02-13 DIAGNOSIS — M4322 Fusion of spine, cervical region: Secondary | ICD-10-CM | POA: Diagnosis not present

## 2019-02-13 DIAGNOSIS — E1169 Type 2 diabetes mellitus with other specified complication: Secondary | ICD-10-CM | POA: Diagnosis not present

## 2019-02-13 DIAGNOSIS — E782 Mixed hyperlipidemia: Secondary | ICD-10-CM | POA: Diagnosis not present

## 2019-02-13 DIAGNOSIS — G9009 Other idiopathic peripheral autonomic neuropathy: Secondary | ICD-10-CM | POA: Diagnosis not present

## 2019-02-13 DIAGNOSIS — I739 Peripheral vascular disease, unspecified: Secondary | ICD-10-CM | POA: Diagnosis not present

## 2019-02-27 ENCOUNTER — Other Ambulatory Visit: Payer: Medicare HMO

## 2019-02-28 DIAGNOSIS — I739 Peripheral vascular disease, unspecified: Secondary | ICD-10-CM | POA: Diagnosis not present

## 2019-02-28 DIAGNOSIS — E1140 Type 2 diabetes mellitus with diabetic neuropathy, unspecified: Secondary | ICD-10-CM | POA: Diagnosis not present

## 2019-02-28 DIAGNOSIS — M79672 Pain in left foot: Secondary | ICD-10-CM | POA: Diagnosis not present

## 2019-02-28 DIAGNOSIS — M79671 Pain in right foot: Secondary | ICD-10-CM | POA: Diagnosis not present

## 2019-03-06 ENCOUNTER — Other Ambulatory Visit: Payer: Medicare HMO

## 2019-03-06 ENCOUNTER — Ambulatory Visit: Payer: Medicare HMO | Admitting: Cardiology

## 2019-03-06 DIAGNOSIS — E7849 Other hyperlipidemia: Secondary | ICD-10-CM | POA: Diagnosis not present

## 2019-03-06 DIAGNOSIS — D649 Anemia, unspecified: Secondary | ICD-10-CM | POA: Diagnosis not present

## 2019-03-06 DIAGNOSIS — R030 Elevated blood-pressure reading, without diagnosis of hypertension: Secondary | ICD-10-CM | POA: Diagnosis not present

## 2019-03-06 DIAGNOSIS — E1169 Type 2 diabetes mellitus with other specified complication: Secondary | ICD-10-CM | POA: Diagnosis not present

## 2019-03-06 DIAGNOSIS — I70219 Atherosclerosis of native arteries of extremities with intermittent claudication, unspecified extremity: Secondary | ICD-10-CM | POA: Diagnosis not present

## 2019-03-06 DIAGNOSIS — G9009 Other idiopathic peripheral autonomic neuropathy: Secondary | ICD-10-CM | POA: Diagnosis not present

## 2019-03-14 ENCOUNTER — Other Ambulatory Visit: Payer: Self-pay

## 2019-03-14 ENCOUNTER — Ambulatory Visit: Payer: Medicare HMO

## 2019-03-14 DIAGNOSIS — I351 Nonrheumatic aortic (valve) insufficiency: Secondary | ICD-10-CM | POA: Diagnosis not present

## 2019-03-15 ENCOUNTER — Encounter: Payer: Self-pay | Admitting: Cardiology

## 2019-03-15 ENCOUNTER — Ambulatory Visit: Payer: Medicare HMO | Admitting: Cardiology

## 2019-03-15 VITALS — BP 170/80 | HR 80 | Temp 98.3°F | Ht 71.0 in | Wt 213.0 lb

## 2019-03-15 DIAGNOSIS — I739 Peripheral vascular disease, unspecified: Secondary | ICD-10-CM | POA: Diagnosis not present

## 2019-03-15 DIAGNOSIS — I351 Nonrheumatic aortic (valve) insufficiency: Secondary | ICD-10-CM

## 2019-03-15 DIAGNOSIS — I10 Essential (primary) hypertension: Secondary | ICD-10-CM | POA: Insufficient documentation

## 2019-03-15 NOTE — Progress Notes (Signed)
Patient is here for follow up visit.  Subjective:   Grant Most., male    DOB: [birth year], 74 y.o.   MRN: [id number]   Chief Complaint  Patient presents with  . Nonrheumatic aortic valve insufficiency    HPI  74 year old Caucasian male with hyperlipidemia, hypertension, borderline diabetes, claudication with mildly reduced b/l ABI.  Patient denies any claudication symptoms.  Has not had any recent angina or dyspnea.  His biggest complaint today is frequent falls.  He endorses numbness in his right foot.  He has not been evaluated for neuropathy prior to this.  His blood pressure remains elevated.  As in the past, patient is reluctant to start any antihypertensive therapy.  He states that his blood pressure is always lower at home and at his PCP visits when measured by a manual blood pressure cuff.  Recent echocardiogram results reviewed with the patient.  Current Outpatient Medications on File Prior to Visit  Medication Sig Dispense Refill  . alfuzosin (UROXATRAL) 10 MG 24 hr tablet Take 10 mg by mouth daily.  11  . cilostazol (PLETAL) 50 MG tablet TAKE 1 TABLET BY MOUTH TWICE A DAY 180 tablet 1  . glipiZIDE (GLUCOTROL) 5 MG tablet Take 5 mg by mouth 3 (three) times daily with meals.    . metFORMIN (GLUCOPHAGE) 500 MG tablet Take 500 mg by mouth 2 (two) times daily.    . Multiple Vitamins-Minerals (MULTIVITAMIN WITH MINERALS) tablet Take 1 tablet by mouth daily.    . nitroGLYCERIN (NITROSTAT) 0.4 MG SL tablet Place 0.4 mg under the tongue every 5 (five) minutes as needed for chest pain.    . rosuvastatin (CRESTOR) 20 MG tablet Take 1 tablet (20 mg total) by mouth daily. 90 tablet 3   No current facility-administered medications on file prior to visit.    Cardiovascular studies:  EKG 03/15/2019: Sinus rhythm 82 bpm. Normal EKG.   Echocardiogram 03/14/2019: Left ventricle cavity is normal in size. Moderate concentric hypertrophy of the left ventricle. Normal LV  systolic function with EF 56%. Normal global wall motion. Doppler evidence of grade I (impaired) diastolic dysfunction, normal LAP.  Trileaflet aortic valve. Trace aortic stenosis. Mild (Grade I) aortic regurgitation. Mild (Grade I) mitral regurgitation. IVC is dilated with respiratory variation. Estimated RA pressure 8 mmHg. No significant change compared to previous study on 04/29/2017.   Recent labs: 02/09/2019: Glucose 98. BUN/Cr 19/0.9. Chol 101, TG 126, HDL 40 HbA1C 6.6%  07/26/2019: LDL 34   Review of Systems  Cardiovascular: Negative for chest pain, dyspnea on exertion, leg swelling, palpitations and syncope.  Musculoskeletal: Positive for falls.       Objective:    Vitals:   03/15/19 0949 03/15/19 0959  BP: (!) 166/77 (!) 170/80  Pulse: 78 80  Temp: 98.3 F (36.8 C)   SpO2: 96%      Physical Exam  Constitutional: He is oriented to person, place, and time. He appears well-developed and well-nourished. No distress.  HENT:  Head: Normocephalic and atraumatic.  Eyes: Pupils are equal, round, and reactive to light. Conjunctivae are normal.  Neck: No JVD present.  Cardiovascular: Normal rate and regular rhythm.  Murmur (I/VIearly diastolic murmur LLSB) heard. Pulses:      Dorsalis pedis pulses are 0 on the right side and 0 on the left side.       Posterior tibial pulses are 1+ on the right side and 1+ on the left side.  Pulmonary/Chest: Effort normal and breath sounds normal.  He has no wheezes. He has no rales.  Abdominal: Soft. Bowel sounds are normal. There is no rebound.  Musculoskeletal:        General: No edema.  Lymphadenopathy:    He has no cervical adenopathy.  Neurological: He is alert and oriented to person, place, and time. No cranial nerve deficit.  Skin: Skin is warm and dry.  Psychiatric: He has a normal mood and affect.  Nursing note and vitals reviewed.       Assessment & Recommendations:   74 year old Caucasian male with hyperlipidemia,  hypertension, borderline diabetes, claudication with mildly reduced b/l ABI.  1. Nonrheumatic aortic valve insufficiency Asymptomatic. Repeat echocardiogram in 2 years.  2. Essential hypertension Remains uncontrolled.  Patient is reluctant to start therapy at this time, stating his home BP is lower. This will nee  follow up with the PCP.  3. Claudication ([place]) Improved symptoms on cilostazol.  Continue statin, metformin.  Given his frequent falls and right foot numbness, consider evaluation for peripheral neuropathy.  Follow-up with me after echocardiogram in 2 years.  Nigel Mormon, MD [facility] Cardiovascular. [place] Pager: [phone] Office: [phone] If no answer Cell [phone]

## 2019-03-31 ENCOUNTER — Telehealth: Payer: Self-pay | Admitting: Urology

## 2019-03-31 NOTE — Telephone Encounter (Signed)
Pt states he received 2 lab orders and when he went to [facility] they wouldn't draw labs because they didn't know which one is accurate. His next appt is Wednesday 3/17. He asked for a return call.

## 2019-04-03 NOTE — Telephone Encounter (Signed)
Clarified labs with pt and appt rescheduled 1 week per pt request.

## 2019-04-04 ENCOUNTER — Other Ambulatory Visit: Payer: Self-pay | Admitting: Urology

## 2019-04-04 DIAGNOSIS — C61 Malignant neoplasm of prostate: Secondary | ICD-10-CM | POA: Diagnosis not present

## 2019-04-05 ENCOUNTER — Ambulatory Visit: Payer: Medicare HMO | Admitting: Urology

## 2019-04-05 LAB — PSA, TOTAL AND FREE
PSA, % Free: 23 % (calc) — ABNORMAL LOW (ref >25)
PSA, Free: 1.5 ng/mL
PSA, Total: 6.6 ng/mL — ABNORMAL HIGH (ref <=4.0)

## 2019-04-10 ENCOUNTER — Other Ambulatory Visit: Payer: Self-pay | Admitting: Urology

## 2019-04-10 DIAGNOSIS — R030 Elevated blood-pressure reading, without diagnosis of hypertension: Secondary | ICD-10-CM | POA: Diagnosis not present

## 2019-04-10 DIAGNOSIS — E1169 Type 2 diabetes mellitus with other specified complication: Secondary | ICD-10-CM | POA: Diagnosis not present

## 2019-04-10 DIAGNOSIS — I70219 Atherosclerosis of native arteries of extremities with intermittent claudication, unspecified extremity: Secondary | ICD-10-CM | POA: Diagnosis not present

## 2019-04-10 DIAGNOSIS — R69 Illness, unspecified: Secondary | ICD-10-CM | POA: Diagnosis not present

## 2019-04-10 DIAGNOSIS — G9009 Other idiopathic peripheral autonomic neuropathy: Secondary | ICD-10-CM | POA: Diagnosis not present

## 2019-04-10 DIAGNOSIS — D649 Anemia, unspecified: Secondary | ICD-10-CM | POA: Diagnosis not present

## 2019-04-10 DIAGNOSIS — E7849 Other hyperlipidemia: Secondary | ICD-10-CM | POA: Diagnosis not present

## 2019-04-19 ENCOUNTER — Other Ambulatory Visit: Payer: Self-pay

## 2019-04-19 ENCOUNTER — Encounter: Payer: Self-pay | Admitting: Urology

## 2019-04-19 ENCOUNTER — Ambulatory Visit: Payer: Medicare HMO | Admitting: Urology

## 2019-04-19 VITALS — BP 158/73 | HR 91 | Temp 96.8°F

## 2019-04-19 DIAGNOSIS — C61 Malignant neoplasm of prostate: Secondary | ICD-10-CM | POA: Diagnosis not present

## 2019-04-19 DIAGNOSIS — R339 Retention of urine, unspecified: Secondary | ICD-10-CM | POA: Insufficient documentation

## 2019-04-19 DIAGNOSIS — N401 Enlarged prostate with lower urinary tract symptoms: Secondary | ICD-10-CM | POA: Insufficient documentation

## 2019-04-19 DIAGNOSIS — N138 Other obstructive and reflux uropathy: Secondary | ICD-10-CM | POA: Diagnosis not present

## 2019-04-19 LAB — BLADDER SCAN AMB NON-IMAGING: Scan Result: 117.4

## 2019-04-19 MED ORDER — SILODOSIN 8 MG PO CAPS: 8.0000 mg | ORAL_CAPSULE | Freq: Every day | ORAL | 3 refills | Status: DC

## 2019-04-19 NOTE — Patient Instructions (Signed)
Prostate Cancer  The prostate is a male gland that helps make semen. Prostate cancer is when abnormal cells grow in this gland. Follow these instructions at home:  Take over-the-counter and prescription medicines only as told by your doctor.  Eat a healthy diet.  Get plenty of sleep.  Ask your doctor for help to find a support group for men with prostate cancer.  Keep all follow-up visits as told by your doctor. This is important.  If you have to go to the hospital, let your cancer doctor (oncologist) know.  Touch, hold, hug, and caress your partner to continue to show sexual feelings. Contact a doctor if:  You have trouble peeing (urinating).  You have blood in your pee (urine).  You have pain in your hips, back, or chest. Get help right away if:  You have weakness in your legs.  You lose feeling (have numbness) in your legs.  You cannot control your pee or your poop (stool).  You have trouble breathing.  You have sudden pain in your chest.  You have chills or a fever. Summary  The prostate is a male gland that helps make semen. Prostate cancer is when abnormal cells grow in this gland.  Ask your doctor for help to find a support group for men with prostate cancer.  Contact a doctor if you have problems peeing or have any new pain that you did not have before. This information is not intended to replace advice given to you by your health care provider. Make sure you discuss any questions you have with your health care provider. Document Revised: 12/18/2016 Document Reviewed: 09/16/2015 Elsevier Patient Education  2020 Elsevier Inc.  

## 2019-04-19 NOTE — Progress Notes (Signed)
Urological Symptom Review  Patient is experiencing the following symptoms: Frequent urination Hard to postpone urination Burning/pain with urination Get up at night to urinate Leakage of urine Stream starts and stops Trouble starting stream Have to strain to urinate   Review of Systems  Gastrointestinal (upper)  : Negative for upper GI symptoms  Gastrointestinal (lower) : Negative for lower GI symptoms  Constitutional : Negative for symptoms  Skin: Negative for skin symptoms  Eyes: Negative for eye symptoms  Ear/Nose/Throat : Sinus problems  Hematologic/Lymphatic: Easy bruising  Cardiovascular : Negative for cardiovascular symptoms  Respiratory : Negative for respiratory symptoms  Endocrine: Negative for endocrine symptoms  Musculoskeletal: Negative for musculoskeletal symptoms  Neurological: Negative for neurological symptoms  Psychologic: Negative for psychiatric symptoms

## 2019-04-19 NOTE — Progress Notes (Signed)
04/19/2019 10:10 AM   Grant [name]. 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 PSa reviewed and has decreased to 6.6. He has stable LUTS on uroxatral 10mg  qhs. Nocturia 2x-6x depending on fluid management. He is unhappy with his urination.  He records from [facility] are follows: 11/06/2015: His PSA has increased from 9.97 to 10.46 in 5 months but today has decreased to 7.1. 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   03/04/2016: His PSA has increased to 8.2 from 7.1 3 months ago.   06/03/2016: PSa increased to 9.3 from 8.2. no bone pain. his LUTS have improved   09/02/2016: PSa is down to 7.3 from 9.3   02/24/2017: PSa was 8.3   10/13/2017: PSA decreased to 4.6 from 8.3   04/13/2018: PSA was 12.7 up from 4.6 no new LUTS.   10/19/2018: no recent PSA. PSA was 9.4 6 months ago. No worsening LUTS       PMH: Past Medical History:  Diagnosis Date  . Cancer [facility])    prostate  . Cervical stenosis of spine   . Claudication ([place])   . Claudication ([place])   . Diabetes mellitus without complication ([place])   . GERD (gastroesophageal reflux disease)   . Glaucoma (increased eye pressure)   . Hyperlipidemia   . Hypertension   . Wears glasses     Surgical History: Past Surgical History:  Procedure Laterality Date  . fracture back    . HERNIA REPAIR    . POSTERIOR CERVICAL FUSION/FORAMINOTOMY N/A 09/10/2017   Procedure: LAMINECTOMY CERVICAL FOUR - CERVICAL FIVE, CERVICAL FIVE - CERVICAL SIX, CERVICAL SIX - CERVICAL SEVEN, POSTERIOR SEGMENTAL INSTRUMENTATION, POSTERIOR LATERAL ARTHRODESIS;  Surgeon: Consuella Lose, MD;  Location: [facility];  Service: Neurosurgery;  Laterality: N/A;  LAMINECTOMY CERVICAL FOUR - CERVICAL FIVE, CERVICAL FIVE - CERVICAL SIX,  CERVICAL SIX - CERVICAL SEVEN, POSTERIOR SEGMENTAL    Home Medications:  Allergies as of 04/19/2019      Reactions   Other Other (See Comments)   Augmentin [amoxicillin-pot Clavulanate] Nausea And Vomiting   Hydrocodone Other (See Comments)   " I get goofy and crazy thoughts. "   Oxycontin [oxycodone Hcl] Other (See Comments)   " I couldn't stay still; I was walking around in circles " Pt has tolerated Percocet this medication 08/2017 admssion      Medication List       Accurate as of April 19, 2019 10:10 AM. If you have any questions, ask your nurse or doctor.        STOP taking these medications   nitroGLYCERIN 0.4 MG SL tablet Commonly known as: NITROSTAT Stopped by: Nicolette Bang, MD     TAKE these medications   alfuzosin 10 MG 24 hr tablet Commonly known as: UROXATRAL Take 10 mg by mouth daily.   cilostazol 50 MG tablet Commonly known as: PLETAL TAKE 1 TABLET BY MOUTH TWICE A DAY   esomeprazole 40 MG capsule Commonly known as: NEXIUM Take 40 mg by mouth daily.   glipiZIDE 5 MG tablet Commonly known as: GLUCOTROL Take 5 mg by mouth 3 (three) times daily with meals.   metFORMIN 500 MG tablet Commonly known as: GLUCOPHAGE Take 500 mg by mouth 2 (two) times daily.   multivitamin with  minerals tablet Take 1 tablet by mouth daily.   rosuvastatin 20 MG tablet Commonly known as: CRESTOR Take 1 tablet (20 mg total) by mouth daily.       Allergies:  Allergies  Allergen Reactions  . Other Other (See Comments)  . Augmentin [Amoxicillin-Pot Clavulanate] Nausea And Vomiting  . Hydrocodone Other (See Comments)    " I get goofy and crazy thoughts. "  . Oxycontin [Oxycodone Hcl] Other (See Comments)    " I couldn't stay still; I was walking around in circles " Pt has tolerated Percocet this medication 08/2017 admssion    Family History: Family History  Problem Relation Age of Onset  . Diabetes Mother   . Lupus Mother   . Prostate cancer Father      Social History:  reports that he quit smoking about 5 years ago. His smoking use included cigarettes. He has never used smokeless tobacco. He reports current alcohol use. He reports that he does not use drugs.  ROS: All other review of systems were reviewed and are negative except what is noted above in HPI  Physical Exam: BP (!) 158/73   Pulse 91   Temp (!) 96.8 F (36 C)   Constitutional:  Alert and oriented, No acute distress. HEENT: [place] AT, moist mucus membranes.  Trachea midline, no masses. Cardiovascular: No clubbing, cyanosis, or edema. Respiratory: Normal respiratory effort, no increased work of breathing. GI: Abdomen is soft, nontender, nondistended, no abdominal masses GU: No CVA tenderness Lymph: No cervical or inguinal lymphadenopathy. Skin: No rashes, bruises or suspicious lesions. Neurologic: Grossly intact, no focal deficits, moving all 4 extremities. Psychiatric: Normal mood and affect.  Laboratory Data: Lab Results  Component Value Date   WBC 10.7 (H) 09/11/2017   HGB 12.3 (L) 09/11/2017   HCT 37.8 (L) 09/11/2017   MCV 87.7 09/11/2017   PLT 158 09/11/2017    Lab Results  Component Value Date   CREATININE 1.08 09/11/2017    No results found for: PSA  No results found for: TESTOSTERONE  No results found for: HGBA1C  Urinalysis No results found for: COLORURINE, APPEARANCEUR, LABSPEC, PHURINE, GLUCOSEU, HGBUR, BILIRUBINUR, KETONESUR, PROTEINUR, UROBILINOGEN, NITRITE, LEUKOCYTESUR  No results found for: LABMICR, [address], RBCUA, LABEPIT, MUCUS, BACTERIA  Pertinent Imaging:  No results found for this or any previous visit. No results found for this or any previous visit. No results found for this or any previous visit. No results found for this or any previous visit. No results found for this or any previous visit. No results found for this or any previous visit. No results found for this or any previous visit. No results found for this or any  previous visit.  Assessment & Plan:    1. Malignant neoplasm of prostate ([facility]) -RTC 6 months with PSA - BLADDER SCAN AMB NON-IMAGING  2. Benign prostatic hyperplasia with urinary obstruction -we will switch to rapaflo 8mg   3. Incomplete emptying of bladder -start rapaflo 8mg  qhs   No follow-ups on file.  Nicolette Bang, MD  [facility] [place]

## 2019-05-12 DIAGNOSIS — G9009 Other idiopathic peripheral autonomic neuropathy: Secondary | ICD-10-CM | POA: Diagnosis not present

## 2019-05-12 DIAGNOSIS — R030 Elevated blood-pressure reading, without diagnosis of hypertension: Secondary | ICD-10-CM | POA: Diagnosis not present

## 2019-05-12 DIAGNOSIS — D649 Anemia, unspecified: Secondary | ICD-10-CM | POA: Diagnosis not present

## 2019-05-12 DIAGNOSIS — I70219 Atherosclerosis of native arteries of extremities with intermittent claudication, unspecified extremity: Secondary | ICD-10-CM | POA: Diagnosis not present

## 2019-05-12 DIAGNOSIS — E7849 Other hyperlipidemia: Secondary | ICD-10-CM | POA: Diagnosis not present

## 2019-05-12 DIAGNOSIS — E1169 Type 2 diabetes mellitus with other specified complication: Secondary | ICD-10-CM | POA: Diagnosis not present

## 2019-05-23 DIAGNOSIS — M79672 Pain in left foot: Secondary | ICD-10-CM | POA: Diagnosis not present

## 2019-05-23 DIAGNOSIS — E1140 Type 2 diabetes mellitus with diabetic neuropathy, unspecified: Secondary | ICD-10-CM | POA: Diagnosis not present

## 2019-05-23 DIAGNOSIS — M79671 Pain in right foot: Secondary | ICD-10-CM | POA: Diagnosis not present

## 2019-05-23 DIAGNOSIS — I739 Peripheral vascular disease, unspecified: Secondary | ICD-10-CM | POA: Diagnosis not present

## 2019-06-01 DIAGNOSIS — H40023 Open angle with borderline findings, high risk, bilateral: Secondary | ICD-10-CM | POA: Diagnosis not present

## 2019-06-01 DIAGNOSIS — H11422 Conjunctival edema, left eye: Secondary | ICD-10-CM | POA: Diagnosis not present

## 2019-06-01 DIAGNOSIS — H18593 Other hereditary corneal dystrophies, bilateral: Secondary | ICD-10-CM | POA: Diagnosis not present

## 2019-06-01 DIAGNOSIS — K219 Gastro-esophageal reflux disease without esophagitis: Secondary | ICD-10-CM | POA: Diagnosis not present

## 2019-06-01 DIAGNOSIS — R110 Nausea: Secondary | ICD-10-CM | POA: Diagnosis not present

## 2019-06-01 DIAGNOSIS — H5712 Ocular pain, left eye: Secondary | ICD-10-CM | POA: Diagnosis not present

## 2019-06-05 DIAGNOSIS — H25813 Combined forms of age-related cataract, bilateral: Secondary | ICD-10-CM | POA: Diagnosis not present

## 2019-06-05 DIAGNOSIS — H40023 Open angle with borderline findings, high risk, bilateral: Secondary | ICD-10-CM | POA: Diagnosis not present

## 2019-06-05 DIAGNOSIS — H11422 Conjunctival edema, left eye: Secondary | ICD-10-CM | POA: Diagnosis not present

## 2019-06-05 DIAGNOSIS — H18593 Other hereditary corneal dystrophies, bilateral: Secondary | ICD-10-CM | POA: Diagnosis not present

## 2019-06-05 DIAGNOSIS — D3131 Benign neoplasm of right choroid: Secondary | ICD-10-CM | POA: Diagnosis not present

## 2019-06-05 DIAGNOSIS — E119 Type 2 diabetes mellitus without complications: Secondary | ICD-10-CM | POA: Diagnosis not present

## 2019-06-06 DIAGNOSIS — I70219 Atherosclerosis of native arteries of extremities with intermittent claudication, unspecified extremity: Secondary | ICD-10-CM | POA: Diagnosis not present

## 2019-06-06 DIAGNOSIS — D649 Anemia, unspecified: Secondary | ICD-10-CM | POA: Diagnosis not present

## 2019-06-06 DIAGNOSIS — E1169 Type 2 diabetes mellitus with other specified complication: Secondary | ICD-10-CM | POA: Diagnosis not present

## 2019-06-06 DIAGNOSIS — E7849 Other hyperlipidemia: Secondary | ICD-10-CM | POA: Diagnosis not present

## 2019-06-06 DIAGNOSIS — G9009 Other idiopathic peripheral autonomic neuropathy: Secondary | ICD-10-CM | POA: Diagnosis not present

## 2019-06-06 DIAGNOSIS — R030 Elevated blood-pressure reading, without diagnosis of hypertension: Secondary | ICD-10-CM | POA: Diagnosis not present

## 2019-06-15 DIAGNOSIS — R69 Illness, unspecified: Secondary | ICD-10-CM | POA: Diagnosis not present

## 2019-06-21 DIAGNOSIS — H40023 Open angle with borderline findings, high risk, bilateral: Secondary | ICD-10-CM | POA: Diagnosis not present

## 2019-06-21 DIAGNOSIS — H25813 Combined forms of age-related cataract, bilateral: Secondary | ICD-10-CM | POA: Diagnosis not present

## 2019-06-21 DIAGNOSIS — H11422 Conjunctival edema, left eye: Secondary | ICD-10-CM | POA: Diagnosis not present

## 2019-06-22 DIAGNOSIS — E1169 Type 2 diabetes mellitus with other specified complication: Secondary | ICD-10-CM | POA: Diagnosis not present

## 2019-06-22 DIAGNOSIS — D649 Anemia, unspecified: Secondary | ICD-10-CM | POA: Diagnosis not present

## 2019-06-22 DIAGNOSIS — G9009 Other idiopathic peripheral autonomic neuropathy: Secondary | ICD-10-CM | POA: Diagnosis not present

## 2019-06-22 DIAGNOSIS — E7849 Other hyperlipidemia: Secondary | ICD-10-CM | POA: Diagnosis not present

## 2019-06-22 DIAGNOSIS — R69 Illness, unspecified: Secondary | ICD-10-CM | POA: Diagnosis not present

## 2019-06-22 DIAGNOSIS — E119 Type 2 diabetes mellitus without complications: Secondary | ICD-10-CM | POA: Diagnosis not present

## 2019-06-22 DIAGNOSIS — E785 Hyperlipidemia, unspecified: Secondary | ICD-10-CM | POA: Diagnosis not present

## 2019-06-22 DIAGNOSIS — E782 Mixed hyperlipidemia: Secondary | ICD-10-CM | POA: Diagnosis not present

## 2019-06-22 DIAGNOSIS — C61 Malignant neoplasm of prostate: Secondary | ICD-10-CM | POA: Diagnosis not present

## 2019-06-26 DIAGNOSIS — M4322 Fusion of spine, cervical region: Secondary | ICD-10-CM | POA: Diagnosis not present

## 2019-06-26 DIAGNOSIS — E1169 Type 2 diabetes mellitus with other specified complication: Secondary | ICD-10-CM | POA: Diagnosis not present

## 2019-06-26 DIAGNOSIS — D649 Anemia, unspecified: Secondary | ICD-10-CM | POA: Diagnosis not present

## 2019-06-26 DIAGNOSIS — K219 Gastro-esophageal reflux disease without esophagitis: Secondary | ICD-10-CM | POA: Diagnosis not present

## 2019-06-26 DIAGNOSIS — G9009 Other idiopathic peripheral autonomic neuropathy: Secondary | ICD-10-CM | POA: Diagnosis not present

## 2019-06-26 DIAGNOSIS — E782 Mixed hyperlipidemia: Secondary | ICD-10-CM | POA: Diagnosis not present

## 2019-06-26 DIAGNOSIS — I739 Peripheral vascular disease, unspecified: Secondary | ICD-10-CM | POA: Diagnosis not present

## 2019-06-26 DIAGNOSIS — M5010 Cervical disc disorder with radiculopathy, unspecified cervical region: Secondary | ICD-10-CM | POA: Diagnosis not present

## 2019-06-26 DIAGNOSIS — Z8546 Personal history of malignant neoplasm of prostate: Secondary | ICD-10-CM | POA: Diagnosis not present

## 2019-06-26 DIAGNOSIS — R030 Elevated blood-pressure reading, without diagnosis of hypertension: Secondary | ICD-10-CM | POA: Diagnosis not present

## 2019-07-05 DIAGNOSIS — R030 Elevated blood-pressure reading, without diagnosis of hypertension: Secondary | ICD-10-CM | POA: Diagnosis not present

## 2019-07-05 DIAGNOSIS — I70219 Atherosclerosis of native arteries of extremities with intermittent claudication, unspecified extremity: Secondary | ICD-10-CM | POA: Diagnosis not present

## 2019-07-05 DIAGNOSIS — E7849 Other hyperlipidemia: Secondary | ICD-10-CM | POA: Diagnosis not present

## 2019-07-05 DIAGNOSIS — E1169 Type 2 diabetes mellitus with other specified complication: Secondary | ICD-10-CM | POA: Diagnosis not present

## 2019-07-05 DIAGNOSIS — D649 Anemia, unspecified: Secondary | ICD-10-CM | POA: Diagnosis not present

## 2019-07-05 DIAGNOSIS — G9009 Other idiopathic peripheral autonomic neuropathy: Secondary | ICD-10-CM | POA: Diagnosis not present

## 2019-07-14 DIAGNOSIS — H25811 Combined forms of age-related cataract, right eye: Secondary | ICD-10-CM | POA: Diagnosis not present

## 2019-08-07 DIAGNOSIS — E7849 Other hyperlipidemia: Secondary | ICD-10-CM | POA: Diagnosis not present

## 2019-08-07 DIAGNOSIS — R030 Elevated blood-pressure reading, without diagnosis of hypertension: Secondary | ICD-10-CM | POA: Diagnosis not present

## 2019-08-07 DIAGNOSIS — D649 Anemia, unspecified: Secondary | ICD-10-CM | POA: Diagnosis not present

## 2019-08-07 DIAGNOSIS — E1169 Type 2 diabetes mellitus with other specified complication: Secondary | ICD-10-CM | POA: Diagnosis not present

## 2019-08-07 DIAGNOSIS — G9009 Other idiopathic peripheral autonomic neuropathy: Secondary | ICD-10-CM | POA: Diagnosis not present

## 2019-08-07 DIAGNOSIS — I70219 Atherosclerosis of native arteries of extremities with intermittent claudication, unspecified extremity: Secondary | ICD-10-CM | POA: Diagnosis not present

## 2019-08-10 DIAGNOSIS — M79671 Pain in right foot: Secondary | ICD-10-CM | POA: Diagnosis not present

## 2019-08-10 DIAGNOSIS — I739 Peripheral vascular disease, unspecified: Secondary | ICD-10-CM | POA: Diagnosis not present

## 2019-08-10 DIAGNOSIS — M79672 Pain in left foot: Secondary | ICD-10-CM | POA: Diagnosis not present

## 2019-08-10 DIAGNOSIS — E1140 Type 2 diabetes mellitus with diabetic neuropathy, unspecified: Secondary | ICD-10-CM | POA: Diagnosis not present

## 2019-08-16 DIAGNOSIS — H2512 Age-related nuclear cataract, left eye: Secondary | ICD-10-CM | POA: Diagnosis not present

## 2019-08-18 DIAGNOSIS — H25812 Combined forms of age-related cataract, left eye: Secondary | ICD-10-CM | POA: Diagnosis not present

## 2019-09-05 DIAGNOSIS — E1169 Type 2 diabetes mellitus with other specified complication: Secondary | ICD-10-CM | POA: Diagnosis not present

## 2019-09-05 DIAGNOSIS — R030 Elevated blood-pressure reading, without diagnosis of hypertension: Secondary | ICD-10-CM | POA: Diagnosis not present

## 2019-09-05 DIAGNOSIS — D649 Anemia, unspecified: Secondary | ICD-10-CM | POA: Diagnosis not present

## 2019-09-05 DIAGNOSIS — E7849 Other hyperlipidemia: Secondary | ICD-10-CM | POA: Diagnosis not present

## 2019-09-05 DIAGNOSIS — G9009 Other idiopathic peripheral autonomic neuropathy: Secondary | ICD-10-CM | POA: Diagnosis not present

## 2019-09-07 ENCOUNTER — Other Ambulatory Visit: Payer: Self-pay

## 2019-09-07 DIAGNOSIS — C61 Malignant neoplasm of prostate: Secondary | ICD-10-CM

## 2019-09-12 DIAGNOSIS — R69 Illness, unspecified: Secondary | ICD-10-CM | POA: Diagnosis not present

## 2019-10-05 DIAGNOSIS — D649 Anemia, unspecified: Secondary | ICD-10-CM | POA: Diagnosis not present

## 2019-10-05 DIAGNOSIS — E1169 Type 2 diabetes mellitus with other specified complication: Secondary | ICD-10-CM | POA: Diagnosis not present

## 2019-10-05 DIAGNOSIS — G9009 Other idiopathic peripheral autonomic neuropathy: Secondary | ICD-10-CM | POA: Diagnosis not present

## 2019-10-05 DIAGNOSIS — E7849 Other hyperlipidemia: Secondary | ICD-10-CM | POA: Diagnosis not present

## 2019-10-05 DIAGNOSIS — R030 Elevated blood-pressure reading, without diagnosis of hypertension: Secondary | ICD-10-CM | POA: Diagnosis not present

## 2019-10-16 ENCOUNTER — Other Ambulatory Visit: Payer: Self-pay

## 2019-10-16 ENCOUNTER — Other Ambulatory Visit: Payer: Medicare HMO

## 2019-10-16 DIAGNOSIS — C61 Malignant neoplasm of prostate: Secondary | ICD-10-CM | POA: Diagnosis not present

## 2019-10-17 LAB — PSA, TOTAL AND FREE
PSA, Free Pct: 26.8 %
PSA, Free: 2.55 ng/mL
Prostate Specific Ag, Serum: 9.5 ng/mL — ABNORMAL HIGH (ref 0.0–4.0)

## 2019-10-18 ENCOUNTER — Ambulatory Visit: Payer: Medicare HMO | Admitting: Urology

## 2019-10-24 DIAGNOSIS — R509 Fever, unspecified: Secondary | ICD-10-CM | POA: Diagnosis not present

## 2019-10-24 DIAGNOSIS — E119 Type 2 diabetes mellitus without complications: Secondary | ICD-10-CM | POA: Diagnosis not present

## 2019-10-24 DIAGNOSIS — M4802 Spinal stenosis, cervical region: Secondary | ICD-10-CM | POA: Diagnosis not present

## 2019-10-24 DIAGNOSIS — Z9889 Other specified postprocedural states: Secondary | ICD-10-CM | POA: Diagnosis not present

## 2019-10-24 DIAGNOSIS — M5030 Other cervical disc degeneration, unspecified cervical region: Secondary | ICD-10-CM | POA: Diagnosis not present

## 2019-10-24 DIAGNOSIS — R69 Illness, unspecified: Secondary | ICD-10-CM | POA: Diagnosis not present

## 2019-10-24 NOTE — Progress Notes (Signed)
Letter sent.

## 2019-10-30 DIAGNOSIS — Z8546 Personal history of malignant neoplasm of prostate: Secondary | ICD-10-CM | POA: Diagnosis not present

## 2019-10-30 DIAGNOSIS — E1169 Type 2 diabetes mellitus with other specified complication: Secondary | ICD-10-CM | POA: Diagnosis not present

## 2019-10-30 DIAGNOSIS — M5010 Cervical disc disorder with radiculopathy, unspecified cervical region: Secondary | ICD-10-CM | POA: Diagnosis not present

## 2019-10-30 DIAGNOSIS — I739 Peripheral vascular disease, unspecified: Secondary | ICD-10-CM | POA: Diagnosis not present

## 2019-10-30 DIAGNOSIS — G9009 Other idiopathic peripheral autonomic neuropathy: Secondary | ICD-10-CM | POA: Diagnosis not present

## 2019-10-30 DIAGNOSIS — E7849 Other hyperlipidemia: Secondary | ICD-10-CM | POA: Diagnosis not present

## 2019-10-30 DIAGNOSIS — E782 Mixed hyperlipidemia: Secondary | ICD-10-CM | POA: Diagnosis not present

## 2019-10-30 DIAGNOSIS — Z0001 Encounter for general adult medical examination with abnormal findings: Secondary | ICD-10-CM | POA: Diagnosis not present

## 2019-10-30 DIAGNOSIS — N183 Chronic kidney disease, stage 3 unspecified: Secondary | ICD-10-CM | POA: Diagnosis not present

## 2019-10-30 DIAGNOSIS — M4322 Fusion of spine, cervical region: Secondary | ICD-10-CM | POA: Diagnosis not present

## 2019-10-30 DIAGNOSIS — D649 Anemia, unspecified: Secondary | ICD-10-CM | POA: Diagnosis not present

## 2019-10-30 DIAGNOSIS — R030 Elevated blood-pressure reading, without diagnosis of hypertension: Secondary | ICD-10-CM | POA: Diagnosis not present

## 2019-10-30 DIAGNOSIS — Z23 Encounter for immunization: Secondary | ICD-10-CM | POA: Diagnosis not present

## 2019-11-06 DIAGNOSIS — E1169 Type 2 diabetes mellitus with other specified complication: Secondary | ICD-10-CM | POA: Diagnosis not present

## 2019-11-06 DIAGNOSIS — E7849 Other hyperlipidemia: Secondary | ICD-10-CM | POA: Diagnosis not present

## 2019-11-06 DIAGNOSIS — N401 Enlarged prostate with lower urinary tract symptoms: Secondary | ICD-10-CM | POA: Diagnosis not present

## 2019-11-06 DIAGNOSIS — K219 Gastro-esophageal reflux disease without esophagitis: Secondary | ICD-10-CM | POA: Diagnosis not present

## 2019-11-09 DIAGNOSIS — M79671 Pain in right foot: Secondary | ICD-10-CM | POA: Diagnosis not present

## 2019-11-09 DIAGNOSIS — E1140 Type 2 diabetes mellitus with diabetic neuropathy, unspecified: Secondary | ICD-10-CM | POA: Diagnosis not present

## 2019-11-09 DIAGNOSIS — M79672 Pain in left foot: Secondary | ICD-10-CM | POA: Diagnosis not present

## 2019-11-09 DIAGNOSIS — I739 Peripheral vascular disease, unspecified: Secondary | ICD-10-CM | POA: Diagnosis not present

## 2019-11-23 ENCOUNTER — Ambulatory Visit (INDEPENDENT_AMBULATORY_CARE_PROVIDER_SITE_OTHER): Payer: Medicare HMO | Admitting: Urology

## 2019-11-23 ENCOUNTER — Other Ambulatory Visit: Payer: Self-pay

## 2019-11-23 ENCOUNTER — Encounter: Payer: Self-pay | Admitting: Urology

## 2019-11-23 VITALS — BP 159/80 | HR 85 | Temp 98.7°F | Ht 71.0 in | Wt 213.0 lb

## 2019-11-23 DIAGNOSIS — C61 Malignant neoplasm of prostate: Secondary | ICD-10-CM | POA: Diagnosis not present

## 2019-11-23 DIAGNOSIS — R339 Retention of urine, unspecified: Secondary | ICD-10-CM | POA: Diagnosis not present

## 2019-11-23 DIAGNOSIS — N401 Enlarged prostate with lower urinary tract symptoms: Secondary | ICD-10-CM

## 2019-11-23 DIAGNOSIS — N138 Other obstructive and reflux uropathy: Secondary | ICD-10-CM | POA: Diagnosis not present

## 2019-11-23 LAB — URINALYSIS, ROUTINE W REFLEX MICROSCOPIC
Bilirubin, UA: NEGATIVE
Glucose, UA: NEGATIVE
Ketones, UA: NEGATIVE
Leukocytes,UA: NEGATIVE
Nitrite, UA: NEGATIVE
Protein,UA: NEGATIVE
RBC, UA: NEGATIVE
Specific Gravity, UA: 1.02 (ref 1.005–1.030)
Urobilinogen, Ur: 1 mg/dL (ref 0.2–1.0)
pH, UA: 6 (ref 5.0–7.5)

## 2019-11-23 LAB — BLADDER SCAN AMB NON-IMAGING: Scan Result: 209

## 2019-11-23 MED ORDER — ALFUZOSIN HCL ER 10 MG PO TB24: 10.0000 mg | ORAL_TABLET | Freq: Two times a day (BID) | ORAL | 3 refills | Status: DC

## 2019-11-23 NOTE — Progress Notes (Signed)
11/23/2019 9:48 AM   Grant [name]. August 18, 1945 [id number]  Referring provider: Celene Squibb, MD [address] [name],  [place] [address]  followup prostate cancer and BPH  HPI: Grant [name] is a 74yo here for followup for prostate cancer and BPH.  PSA increased to 9.5 from 6.6. Last visit he was started on rapaflo which failed to improve his urination. He is back on the uroxatral. He has nocturia 3-5x based on fluid management. PVR today is 209. Stream is fair.   PMH: Past Medical History:  Diagnosis Date  . Cancer [facility])    prostate  . Cervical stenosis of spine   . Claudication ([place])   . Claudication ([place])   . Diabetes mellitus without complication ([place])   . GERD (gastroesophageal reflux disease)   . Glaucoma (increased eye pressure)   . Hyperlipidemia   . Hypertension   . Wears glasses     Surgical History: Past Surgical History:  Procedure Laterality Date  . fracture back    . HERNIA REPAIR    . POSTERIOR CERVICAL FUSION/FORAMINOTOMY N/A 09/10/2017   Procedure: LAMINECTOMY CERVICAL FOUR - CERVICAL FIVE, CERVICAL FIVE - CERVICAL SIX, CERVICAL SIX - CERVICAL SEVEN, POSTERIOR SEGMENTAL INSTRUMENTATION, POSTERIOR LATERAL ARTHRODESIS;  Surgeon: Consuella Lose, MD;  Location: [facility];  Service: Neurosurgery;  Laterality: N/A;  LAMINECTOMY CERVICAL FOUR - CERVICAL FIVE, CERVICAL FIVE - CERVICAL SIX, CERVICAL SIX - CERVICAL SEVEN, POSTERIOR SEGMENTAL    Home Medications:  Allergies as of 11/23/2019      Reactions   Other Other (See Comments)   Augmentin [amoxicillin-pot Clavulanate] Nausea And Vomiting   Hydrocodone Other (See Comments)   " I get goofy and crazy thoughts. "   Oxycontin [oxycodone Hcl] Other (See Comments)   " I couldn't stay still; I was walking around in circles " Pt has tolerated Percocet this medication 08/2017 admssion      Medication List       Accurate as of November 23, 2019  9:48 AM. If you have any questions, ask your nurse or  doctor.        alfuzosin 10 MG 24 hr tablet Commonly known as: UROXATRAL Take 10 mg by mouth daily.   cilostazol 50 MG tablet Commonly known as: PLETAL TAKE 1 TABLET BY MOUTH TWICE A DAY   esomeprazole 40 MG capsule Commonly known as: NEXIUM Take 40 mg by mouth daily.   glipiZIDE 5 MG tablet Commonly known as: GLUCOTROL Take 5 mg by mouth 3 (three) times daily with meals.   metFORMIN 500 MG tablet Commonly known as: GLUCOPHAGE Take 500 mg by mouth 2 (two) times daily.   multivitamin with minerals tablet Take 1 tablet by mouth daily.   OneTouch Ultra test strip Generic drug: glucose blood 1 each daily.   rosuvastatin 20 MG tablet Commonly known as: CRESTOR Take 1 tablet (20 mg total) by mouth daily.   silodosin 8 MG Caps capsule Commonly known as: RAPAFLO Take 1 capsule (8 mg total) by mouth daily with breakfast.       Allergies:  Allergies  Allergen Reactions  . Other Other (See Comments)  . Augmentin [Amoxicillin-Pot Clavulanate] Nausea And Vomiting  . Hydrocodone Other (See Comments)    " I get goofy and crazy thoughts. "  . Oxycontin [Oxycodone Hcl] Other (See Comments)    " I couldn't stay still; I was walking around in circles " Pt has tolerated Percocet this medication 08/2017 admssion    Family History:  Family History  Problem Relation Age of Onset  . Diabetes Mother   . Lupus Mother   . Prostate cancer Father     Social History:  reports that he quit smoking about 5 years ago. His smoking use included cigarettes. He has never used smokeless tobacco. He reports current alcohol use. He reports that he does not use drugs.  ROS: All other review of systems were reviewed and are negative except what is noted above in HPI  Physical Exam: BP (!) 159/80   Pulse 85   Temp 98.7 F (37.1 C)   Ht 5\' 11"  (1.803 m)   Wt 213 lb (96.6 kg)   BMI 29.71 kg/m   Constitutional:  Alert and oriented, No acute distress. HEENT: [place] AT, moist mucus membranes.   Trachea midline, no masses. Cardiovascular: No clubbing, cyanosis, or edema. Respiratory: Normal respiratory effort, no increased work of breathing. GI: Abdomen is soft, nontender, nondistended, no abdominal masses GU: No CVA tenderness.  Lymph: No cervical or inguinal lymphadenopathy. Skin: No rashes, bruises or suspicious lesions. Neurologic: Grossly intact, no focal deficits, moving all 4 extremities. Psychiatric: Normal mood and affect.  Laboratory Data: Lab Results  Component Value Date   WBC 10.7 (H) 09/11/2017   HGB 12.3 (L) 09/11/2017   HCT 37.8 (L) 09/11/2017   MCV 87.7 09/11/2017   PLT 158 09/11/2017    Lab Results  Component Value Date   CREATININE 1.08 09/11/2017    No results found for: PSA  No results found for: TESTOSTERONE  No results found for: HGBA1C  Urinalysis No results found for: COLORURINE, APPEARANCEUR, LABSPEC, PHURINE, GLUCOSEU, HGBUR, BILIRUBINUR, KETONESUR, PROTEINUR, UROBILINOGEN, NITRITE, LEUKOCYTESUR  No results found for: LABMICR, [place], RBCUA, LABEPIT, MUCUS, BACTERIA  Pertinent Imaging:  No results found for this or any previous visit.  No results found for this or any previous visit.  No results found for this or any previous visit.  No results found for this or any previous visit.  No results found for this or any previous visit.  No results found for this or any previous visit.  No results found for this or any previous visit.  No results found for this or any previous visit.   Assessment & Plan:    1. Malignant neoplasm of prostate ([place]) -Continue observation. RTC 6 months with PSA - Urinalysis, Routine w reflex microscopic - BLADDER SCAN AMB NON-IMAGING  2. Benign prostatic hyperplasia with urinary obstruction -We will increase uroxatral 10mg  to BID  3. Incomplete emptying of bladder -Uroxatral 10mg  BID   No follow-ups on file.  Nicolette Bang, MD  [facility] [place]

## 2019-11-23 NOTE — Progress Notes (Signed)
Bladder Scan Patient can void: 201 ml Performed By: Durenda Guthrie, LPN   Urological Symptom Review  Patient is experiencing the following symptoms: Frequent urination Hard to postpone urination Burning/pain with urination Get up at night to urinate Leakage of urine Stream starts and stops Trouble starting stream Have to strain to urinate Weak stream   Review of Systems  Gastrointestinal (upper)  : Negative for upper GI symptoms  Gastrointestinal (lower) : Negative for lower GI symptoms  Constitutional : Negative for symptoms  Skin: Negative for skin symptoms  Eyes: Negative for eye symptoms  Ear/Nose/Throat : Negative for Ear/Nose/Throat symptoms  Hematologic/Lymphatic: Negative for Hematologic/Lymphatic symptoms  Cardiovascular : Negative for cardiovascular symptoms  Respiratory : Negative for respiratory symptoms  Endocrine: Negative for endocrine symptoms  Musculoskeletal: Negative for musculoskeletal symptoms  Neurological: Negative for neurological symptoms  Psychologic: Negative for psychiatric symptoms

## 2019-11-23 NOTE — Addendum Note (Signed)
Addended by: Cleon Gustin on: 11/23/2019 10:05 AM   Modules accepted: Orders

## 2019-11-23 NOTE — Patient Instructions (Signed)

## 2019-12-07 DIAGNOSIS — K219 Gastro-esophageal reflux disease without esophagitis: Secondary | ICD-10-CM | POA: Diagnosis not present

## 2019-12-07 DIAGNOSIS — N401 Enlarged prostate with lower urinary tract symptoms: Secondary | ICD-10-CM | POA: Diagnosis not present

## 2019-12-07 DIAGNOSIS — E7849 Other hyperlipidemia: Secondary | ICD-10-CM | POA: Diagnosis not present

## 2019-12-07 DIAGNOSIS — E1169 Type 2 diabetes mellitus with other specified complication: Secondary | ICD-10-CM | POA: Diagnosis not present

## 2019-12-25 DIAGNOSIS — L02512 Cutaneous abscess of left hand: Secondary | ICD-10-CM | POA: Diagnosis not present

## 2019-12-26 DIAGNOSIS — R509 Fever, unspecified: Secondary | ICD-10-CM | POA: Diagnosis not present

## 2019-12-26 DIAGNOSIS — R69 Illness, unspecified: Secondary | ICD-10-CM | POA: Diagnosis not present

## 2019-12-26 DIAGNOSIS — M4802 Spinal stenosis, cervical region: Secondary | ICD-10-CM | POA: Diagnosis not present

## 2019-12-26 DIAGNOSIS — Z9889 Other specified postprocedural states: Secondary | ICD-10-CM | POA: Diagnosis not present

## 2019-12-26 DIAGNOSIS — M5030 Other cervical disc degeneration, unspecified cervical region: Secondary | ICD-10-CM | POA: Diagnosis not present

## 2019-12-26 DIAGNOSIS — E119 Type 2 diabetes mellitus without complications: Secondary | ICD-10-CM | POA: Diagnosis not present

## 2020-01-01 DIAGNOSIS — Z961 Presence of intraocular lens: Secondary | ICD-10-CM | POA: Diagnosis not present

## 2020-01-01 DIAGNOSIS — H40023 Open angle with borderline findings, high risk, bilateral: Secondary | ICD-10-CM | POA: Diagnosis not present

## 2020-01-01 DIAGNOSIS — D3131 Benign neoplasm of right choroid: Secondary | ICD-10-CM | POA: Diagnosis not present

## 2020-01-01 DIAGNOSIS — E119 Type 2 diabetes mellitus without complications: Secondary | ICD-10-CM | POA: Diagnosis not present

## 2020-01-01 DIAGNOSIS — H18593 Other hereditary corneal dystrophies, bilateral: Secondary | ICD-10-CM | POA: Diagnosis not present

## 2020-01-19 DIAGNOSIS — E1169 Type 2 diabetes mellitus with other specified complication: Secondary | ICD-10-CM | POA: Diagnosis not present

## 2020-01-19 DIAGNOSIS — D649 Anemia, unspecified: Secondary | ICD-10-CM | POA: Diagnosis not present

## 2020-01-19 DIAGNOSIS — G9009 Other idiopathic peripheral autonomic neuropathy: Secondary | ICD-10-CM | POA: Diagnosis not present

## 2020-01-19 DIAGNOSIS — I70219 Atherosclerosis of native arteries of extremities with intermittent claudication, unspecified extremity: Secondary | ICD-10-CM | POA: Diagnosis not present

## 2020-01-19 DIAGNOSIS — E7849 Other hyperlipidemia: Secondary | ICD-10-CM | POA: Diagnosis not present

## 2020-01-19 DIAGNOSIS — R030 Elevated blood-pressure reading, without diagnosis of hypertension: Secondary | ICD-10-CM | POA: Diagnosis not present

## 2020-01-22 DIAGNOSIS — L02512 Cutaneous abscess of left hand: Secondary | ICD-10-CM | POA: Diagnosis not present

## 2020-01-22 DIAGNOSIS — J069 Acute upper respiratory infection, unspecified: Secondary | ICD-10-CM | POA: Diagnosis not present

## 2020-02-15 DIAGNOSIS — R0602 Shortness of breath: Secondary | ICD-10-CM | POA: Diagnosis not present

## 2020-02-15 DIAGNOSIS — R5383 Other fatigue: Secondary | ICD-10-CM | POA: Diagnosis not present

## 2020-02-15 DIAGNOSIS — R42 Dizziness and giddiness: Secondary | ICD-10-CM | POA: Diagnosis not present

## 2020-02-17 DIAGNOSIS — E782 Mixed hyperlipidemia: Secondary | ICD-10-CM | POA: Diagnosis not present

## 2020-02-17 DIAGNOSIS — L02512 Cutaneous abscess of left hand: Secondary | ICD-10-CM | POA: Diagnosis not present

## 2020-02-17 DIAGNOSIS — K429 Umbilical hernia without obstruction or gangrene: Secondary | ICD-10-CM | POA: Diagnosis not present

## 2020-02-17 DIAGNOSIS — E1169 Type 2 diabetes mellitus with other specified complication: Secondary | ICD-10-CM | POA: Diagnosis not present

## 2020-02-29 DIAGNOSIS — M79671 Pain in right foot: Secondary | ICD-10-CM | POA: Diagnosis not present

## 2020-02-29 DIAGNOSIS — M79672 Pain in left foot: Secondary | ICD-10-CM | POA: Diagnosis not present

## 2020-02-29 DIAGNOSIS — E1140 Type 2 diabetes mellitus with diabetic neuropathy, unspecified: Secondary | ICD-10-CM | POA: Diagnosis not present

## 2020-02-29 DIAGNOSIS — I739 Peripheral vascular disease, unspecified: Secondary | ICD-10-CM | POA: Diagnosis not present

## 2020-03-13 DIAGNOSIS — R69 Illness, unspecified: Secondary | ICD-10-CM | POA: Diagnosis not present

## 2020-03-13 DIAGNOSIS — Z9889 Other specified postprocedural states: Secondary | ICD-10-CM | POA: Diagnosis not present

## 2020-03-13 DIAGNOSIS — M4802 Spinal stenosis, cervical region: Secondary | ICD-10-CM | POA: Diagnosis not present

## 2020-03-13 DIAGNOSIS — E119 Type 2 diabetes mellitus without complications: Secondary | ICD-10-CM | POA: Diagnosis not present

## 2020-03-13 DIAGNOSIS — M5030 Other cervical disc degeneration, unspecified cervical region: Secondary | ICD-10-CM | POA: Diagnosis not present

## 2020-03-13 DIAGNOSIS — R509 Fever, unspecified: Secondary | ICD-10-CM | POA: Diagnosis not present

## 2020-03-18 DIAGNOSIS — R030 Elevated blood-pressure reading, without diagnosis of hypertension: Secondary | ICD-10-CM | POA: Diagnosis not present

## 2020-03-18 DIAGNOSIS — D649 Anemia, unspecified: Secondary | ICD-10-CM | POA: Diagnosis not present

## 2020-03-18 DIAGNOSIS — M5010 Cervical disc disorder with radiculopathy, unspecified cervical region: Secondary | ICD-10-CM | POA: Diagnosis not present

## 2020-03-18 DIAGNOSIS — E1169 Type 2 diabetes mellitus with other specified complication: Secondary | ICD-10-CM | POA: Diagnosis not present

## 2020-03-18 DIAGNOSIS — E782 Mixed hyperlipidemia: Secondary | ICD-10-CM | POA: Diagnosis not present

## 2020-03-18 DIAGNOSIS — G9009 Other idiopathic peripheral autonomic neuropathy: Secondary | ICD-10-CM | POA: Diagnosis not present

## 2020-03-18 DIAGNOSIS — Z8546 Personal history of malignant neoplasm of prostate: Secondary | ICD-10-CM | POA: Diagnosis not present

## 2020-03-18 DIAGNOSIS — K219 Gastro-esophageal reflux disease without esophagitis: Secondary | ICD-10-CM | POA: Diagnosis not present

## 2020-03-18 DIAGNOSIS — M4322 Fusion of spine, cervical region: Secondary | ICD-10-CM | POA: Diagnosis not present

## 2020-03-18 DIAGNOSIS — I739 Peripheral vascular disease, unspecified: Secondary | ICD-10-CM | POA: Diagnosis not present

## 2020-04-17 DIAGNOSIS — R030 Elevated blood-pressure reading, without diagnosis of hypertension: Secondary | ICD-10-CM | POA: Diagnosis not present

## 2020-04-17 DIAGNOSIS — M5010 Cervical disc disorder with radiculopathy, unspecified cervical region: Secondary | ICD-10-CM | POA: Diagnosis not present

## 2020-04-17 DIAGNOSIS — Z8546 Personal history of malignant neoplasm of prostate: Secondary | ICD-10-CM | POA: Diagnosis not present

## 2020-04-17 DIAGNOSIS — E1169 Type 2 diabetes mellitus with other specified complication: Secondary | ICD-10-CM | POA: Diagnosis not present

## 2020-04-17 DIAGNOSIS — E782 Mixed hyperlipidemia: Secondary | ICD-10-CM | POA: Diagnosis not present

## 2020-04-17 DIAGNOSIS — D649 Anemia, unspecified: Secondary | ICD-10-CM | POA: Diagnosis not present

## 2020-04-17 DIAGNOSIS — M4322 Fusion of spine, cervical region: Secondary | ICD-10-CM | POA: Diagnosis not present

## 2020-04-17 DIAGNOSIS — K219 Gastro-esophageal reflux disease without esophagitis: Secondary | ICD-10-CM | POA: Diagnosis not present

## 2020-04-17 DIAGNOSIS — I739 Peripheral vascular disease, unspecified: Secondary | ICD-10-CM | POA: Diagnosis not present

## 2020-04-17 DIAGNOSIS — G9009 Other idiopathic peripheral autonomic neuropathy: Secondary | ICD-10-CM | POA: Diagnosis not present

## 2020-04-22 DIAGNOSIS — R112 Nausea with vomiting, unspecified: Secondary | ICD-10-CM | POA: Diagnosis not present

## 2020-04-22 DIAGNOSIS — R42 Dizziness and giddiness: Secondary | ICD-10-CM | POA: Diagnosis not present

## 2020-04-22 DIAGNOSIS — J0100 Acute maxillary sinusitis, unspecified: Secondary | ICD-10-CM | POA: Diagnosis not present

## 2020-04-22 DIAGNOSIS — Z1152 Encounter for screening for COVID-19: Secondary | ICD-10-CM | POA: Diagnosis not present

## 2020-04-22 DIAGNOSIS — R051 Acute cough: Secondary | ICD-10-CM | POA: Diagnosis not present

## 2020-05-16 DIAGNOSIS — M79672 Pain in left foot: Secondary | ICD-10-CM | POA: Diagnosis not present

## 2020-05-16 DIAGNOSIS — M79671 Pain in right foot: Secondary | ICD-10-CM | POA: Diagnosis not present

## 2020-05-16 DIAGNOSIS — E1140 Type 2 diabetes mellitus with diabetic neuropathy, unspecified: Secondary | ICD-10-CM | POA: Diagnosis not present

## 2020-05-16 DIAGNOSIS — I739 Peripheral vascular disease, unspecified: Secondary | ICD-10-CM | POA: Diagnosis not present

## 2020-05-19 DIAGNOSIS — R051 Acute cough: Secondary | ICD-10-CM | POA: Diagnosis not present

## 2020-05-19 DIAGNOSIS — J069 Acute upper respiratory infection, unspecified: Secondary | ICD-10-CM | POA: Diagnosis not present

## 2020-05-19 DIAGNOSIS — R69 Illness, unspecified: Secondary | ICD-10-CM | POA: Diagnosis not present

## 2020-05-19 DIAGNOSIS — K219 Gastro-esophageal reflux disease without esophagitis: Secondary | ICD-10-CM | POA: Diagnosis not present

## 2020-05-23 ENCOUNTER — Ambulatory Visit: Payer: Medicare HMO | Admitting: Urology

## 2020-05-23 ENCOUNTER — Other Ambulatory Visit: Payer: Medicare HMO

## 2020-05-23 ENCOUNTER — Other Ambulatory Visit: Payer: Self-pay

## 2020-05-23 DIAGNOSIS — C61 Malignant neoplasm of prostate: Secondary | ICD-10-CM | POA: Diagnosis not present

## 2020-05-24 ENCOUNTER — Ambulatory Visit (INDEPENDENT_AMBULATORY_CARE_PROVIDER_SITE_OTHER): Payer: Medicare HMO | Admitting: Urology

## 2020-05-24 ENCOUNTER — Encounter: Payer: Self-pay | Admitting: Urology

## 2020-05-24 VITALS — BP 139/62 | HR 106 | Temp 98.5°F | Ht 71.0 in | Wt 210.0 lb

## 2020-05-24 DIAGNOSIS — C61 Malignant neoplasm of prostate: Secondary | ICD-10-CM

## 2020-05-24 DIAGNOSIS — R339 Retention of urine, unspecified: Secondary | ICD-10-CM

## 2020-05-24 DIAGNOSIS — N138 Other obstructive and reflux uropathy: Secondary | ICD-10-CM | POA: Diagnosis not present

## 2020-05-24 DIAGNOSIS — N401 Enlarged prostate with lower urinary tract symptoms: Secondary | ICD-10-CM

## 2020-05-24 DIAGNOSIS — N3000 Acute cystitis without hematuria: Secondary | ICD-10-CM

## 2020-05-24 LAB — PSA: Prostate Specific Ag, Serum: 10.7 ng/mL — ABNORMAL HIGH (ref 0.0–4.0)

## 2020-05-24 LAB — BLADDER SCAN AMB NON-IMAGING: Scan Result: 136

## 2020-05-24 MED ORDER — SILODOSIN 8 MG PO CAPS: 8.0000 mg | ORAL_CAPSULE | Freq: Every day | ORAL | 11 refills | Status: DC

## 2020-05-24 NOTE — Progress Notes (Signed)
post void residual= 136  Urological Symptom Review  Patient is experiencing the following symptoms: Frequent urination Hard to postpone urination Burning/pain with urination Get up at night to urinate Leakage of urine Stream starts and stops Trouble starting stream Have to strain to urinate Weak stream   Review of Systems  Gastrointestinal (upper)  : Indigestion/heartburn  Gastrointestinal (lower) : Diarrhea  Constitutional : Negative for symptoms  Skin: Negative for skin symptoms  Eyes: Negative for eye symptoms  Ear/Nose/Throat : Negative for Ear/Nose/Throat symptoms  Hematologic/Lymphatic: Negative for Hematologic/Lymphatic symptoms  Cardiovascular : Negative for cardiovascular symptoms  Respiratory : Negative for respiratory symptoms  Endocrine: Negative for endocrine symptoms  Musculoskeletal: Negative for musculoskeletal symptoms  Neurological: Negative for neurological symptoms  Psychologic: Negative for psychiatric symptoms

## 2020-05-24 NOTE — Progress Notes (Signed)
05/24/2020 10:50 AM   [place]. [birth year] [id number]  Referring provider: Celene Squibb, MD [address],  [place] [address]  Followup prostate cancer and BPH  HPI: Grant [name] is a 75yo here for followup for prostate cancer and BPH. IPSS 29 QOL 5. He had a PSA yesterday. He is unhappy with his urination. He has intermittent dysuria. Nocturia 5x. Stream intermittently weak. He is on uroxatral 10mg  BID.   PMH: Past Medical History:  Diagnosis Date  . Cancer [facility])    prostate  . Cervical stenosis of spine   . Claudication ([place])   . Claudication ([place])   . Diabetes mellitus without complication ([place])   . GERD (gastroesophageal reflux disease)   . Glaucoma (increased eye pressure)   . Hyperlipidemia   . Hypertension   . Wears glasses     Surgical History: Past Surgical History:  Procedure Laterality Date  . fracture back    . HERNIA REPAIR    . POSTERIOR CERVICAL FUSION/FORAMINOTOMY N/A 09/10/2017   Procedure: LAMINECTOMY CERVICAL FOUR - CERVICAL FIVE, CERVICAL FIVE - CERVICAL SIX, CERVICAL SIX - CERVICAL SEVEN, POSTERIOR SEGMENTAL INSTRUMENTATION, POSTERIOR LATERAL ARTHRODESIS;  Surgeon: Consuella Lose, MD;  Location: [facility];  Service: Neurosurgery;  Laterality: N/A;  LAMINECTOMY CERVICAL FOUR - CERVICAL FIVE, CERVICAL FIVE - CERVICAL SIX, CERVICAL SIX - CERVICAL SEVEN, POSTERIOR SEGMENTAL    Home Medications:  Allergies as of 05/24/2020      Reactions   Other Other (See Comments)   Augmentin [amoxicillin-pot Clavulanate] Nausea And Vomiting   Hydrocodone Other (See Comments)   " I get goofy and crazy thoughts. "   Oxycontin [oxycodone Hcl] Other (See Comments)   " I couldn't stay still; I was walking around in circles " Pt has tolerated Percocet this medication 08/2017 admssion      Medication List       Accurate as of May 24, 2020 10:50 AM. If you have any questions, ask your nurse or doctor.        alfuzosin 10 MG 24 hr  tablet Commonly known as: UROXATRAL Take 1 tablet (10 mg total) by mouth in the morning and at bedtime.   cilostazol 50 MG tablet Commonly known as: PLETAL TAKE 1 TABLET BY MOUTH TWICE A DAY   esomeprazole 40 MG capsule Commonly known as: NEXIUM Take 40 mg by mouth daily.   glipiZIDE 5 MG tablet Commonly known as: GLUCOTROL Take 5 mg by mouth 3 (three) times daily with meals.   metFORMIN 500 MG tablet Commonly known as: GLUCOPHAGE Take 500 mg by mouth 2 (two) times daily.   multivitamin with minerals tablet Take 1 tablet by mouth daily.   OneTouch Ultra test strip Generic drug: glucose blood 1 each daily.   rosuvastatin 20 MG tablet Commonly known as: CRESTOR Take 1 tablet (20 mg total) by mouth daily.       Allergies:  Allergies  Allergen Reactions  . Other Other (See Comments)  . Augmentin [Amoxicillin-Pot Clavulanate] Nausea And Vomiting  . Hydrocodone Other (See Comments)    " I get goofy and crazy thoughts. "  . Oxycontin [Oxycodone Hcl] Other (See Comments)    " I couldn't stay still; I was walking around in circles " Pt has tolerated Percocet this medication 08/2017 admssion    Family History: Family History  Problem Relation Age of Onset  . Diabetes Mother   . Lupus Mother   . Prostate cancer Father  Social History:  reports that he quit smoking about 6 years ago. His smoking use included cigarettes. He has never used smokeless tobacco. He reports current alcohol use. He reports that he does not use drugs.  ROS: All other review of systems were reviewed and are negative except what is noted above in HPI  Physical Exam: BP 139/62   Pulse (!) 106   Temp 98.5 F (36.9 C)   Ht 5\' 11"  (1.803 m)   Wt 210 lb (95.3 kg)   BMI 29.29 kg/m   Constitutional:  Alert and oriented, No acute distress. HEENT: [place] AT, moist mucus membranes.  Trachea midline, no masses. Cardiovascular: No clubbing, cyanosis, or edema. Respiratory: Normal respiratory effort,  no increased work of breathing. GI: Abdomen is soft, nontender, nondistended, no abdominal masses GU: No CVA tenderness.  Lymph: No cervical or inguinal lymphadenopathy. Skin: No rashes, bruises or suspicious lesions. Neurologic: Grossly intact, no focal deficits, moving all 4 extremities. Psychiatric: Normal mood and affect.  Laboratory Data: Lab Results  Component Value Date   WBC 10.7 (H) 09/11/2017   HGB 12.3 (L) 09/11/2017   HCT 37.8 (L) 09/11/2017   MCV 87.7 09/11/2017   PLT 158 09/11/2017    Lab Results  Component Value Date   CREATININE 1.08 09/11/2017    No results found for: PSA  No results found for: TESTOSTERONE  No results found for: HGBA1C  Urinalysis    Component Value Date/Time   APPEARANCEUR Clear 11/23/2019 0944   GLUCOSEU Negative 11/23/2019 0944   BILIRUBINUR Negative 11/23/2019 0944   PROTEINUR Negative 11/23/2019 0944   NITRITE Negative 11/23/2019 0944   LEUKOCYTESUR Negative 11/23/2019 0944    Lab Results  Component Value Date   LABMICR Comment 11/23/2019    Pertinent Imaging:  No results found for this or any previous visit.  No results found for this or any previous visit.  No results found for this or any previous visit.  No results found for this or any previous visit.  No results found for this or any previous visit.  No results found for this or any previous visit.  No results found for this or any previous visit.  No results found for this or any previous visit.   Assessment & Plan:    1. Benign prostatic hyperplasia with urinary obstruction -we will start rapaflo 8mg  qhs - Urinalysis, Routine w reflex microscopic - BLADDER SCAN AMB NON-IMAGING  2. Malignant neoplasm of prostate ([place]) -Will call with PSa results -RTC 6 months with PSA  3. Incomplete emptying of bladder -rapaflo 8mg  qhs   No follow-ups on file.  Nicolette Bang, MD  [facility] [place]

## 2020-05-24 NOTE — Patient Instructions (Signed)
Prostate Cancer  The prostate is a male gland that helps make semen. It is located below a man's bladder, in front of the rectum. Prostate cancer is when abnormal cells grow in this gland. What are the causes? The cause of this condition is not known. What increases the risk? You are more likely to develop this condition if:  You are 76 years of age or older.  You are African American.  You have a family history of prostate cancer.  You have a family history of breast cancer. What are the signs or symptoms? Symptoms of this condition include:  A need to pee often.  Peeing that is weak, or pee that stops and starts.  Trouble starting or stopping your pee.  Inability to pee.  Blood in your pee or semen.  Pain in the lower back, lower belly (abdomen), hips, or upper thighs.  Trouble getting an erection.  Trouble emptying all of your pee. How is this treated? Treatment for this condition depends on your age, your health, the kind of treatment you like, and how far the cancer has spread. Treatments include:  Being watched. This is called observation. You will be tested from time to time, but you will not get treated. Tests are to make sure that the cancer is not growing.  Surgery. This may be done to remove the prostate, to remove the testicles, or to freeze or kill cancer cells.  Radiation. This uses a strong beam to kill cancer cells.  Ultrasound energy. This uses strong sound waves to kill cancer cells.  Chemotherapy. This uses medicines that stop cancer cells from increasing. This kills cancer cells and healthy cells.  Targeted therapy. This kills cancer cells only. Healthy cells are not affected.  Hormone treatment. This stops the body from making hormones that help the cancer cells to grow. Follow these instructions at home:  Take over-the-counter and prescription medicines only as told by your doctor.  Eat a healthy diet.  Get plenty of sleep.  Ask your  doctor for help to find a support group for men with prostate cancer.  If you have to go to the hospital, let your cancer doctor (oncologist) know.  Treatment may affect your ability to have sex. Touch, hold, hug, and caress your partner to have intimate moments.  Keep all follow-up visits as told by your doctor. This is important. Contact a doctor if:  You have new or more trouble peeing.  You have new or more blood in your pee.  You have new or more pain in your hips, back, or chest. Get help right away if:  You have weakness in your legs.  You lose feeling in your legs.  You cannot control your pee or your poop (stool).  You have chills or a fever. Summary  The prostate is a male gland that helps make semen.  Prostate cancer is when abnormal cells grow in this gland.  Treatment includes doing surgery, using medicines, using very strong beams, or watching without treatment.  Ask your doctor for help to find a support group for men with prostate cancer.  Contact a doctor if you have problems peeing or have any new pain that you did not have before. This information is not intended to replace advice given to you by your health care provider. Make sure you discuss any questions you have with your health care provider. Document Revised: 12/20/2018 Document Reviewed: 12/20/2018 Elsevier Patient Education  2021 Elsevier Inc.  

## 2020-05-24 NOTE — Addendum Note (Signed)
Addended by: Cleon Gustin on: 05/24/2020 10:59 AM   Modules accepted: Orders

## 2020-05-29 DIAGNOSIS — R4586 Emotional lability: Secondary | ICD-10-CM | POA: Insufficient documentation

## 2020-05-29 DIAGNOSIS — M5030 Other cervical disc degeneration, unspecified cervical region: Secondary | ICD-10-CM | POA: Insufficient documentation

## 2020-05-29 DIAGNOSIS — Z981 Arthrodesis status: Secondary | ICD-10-CM | POA: Insufficient documentation

## 2020-05-29 DIAGNOSIS — H4010X0 Unspecified open-angle glaucoma, stage unspecified: Secondary | ICD-10-CM | POA: Insufficient documentation

## 2020-05-29 DIAGNOSIS — J069 Acute upper respiratory infection, unspecified: Secondary | ICD-10-CM | POA: Diagnosis not present

## 2020-05-29 DIAGNOSIS — I739 Peripheral vascular disease, unspecified: Secondary | ICD-10-CM | POA: Insufficient documentation

## 2020-05-29 DIAGNOSIS — E119 Type 2 diabetes mellitus without complications: Secondary | ICD-10-CM | POA: Insufficient documentation

## 2020-05-29 DIAGNOSIS — Z8546 Personal history of malignant neoplasm of prostate: Secondary | ICD-10-CM | POA: Insufficient documentation

## 2020-05-29 DIAGNOSIS — F29 Unspecified psychosis not due to a substance or known physiological condition: Secondary | ICD-10-CM | POA: Insufficient documentation

## 2020-05-29 DIAGNOSIS — K219 Gastro-esophageal reflux disease without esophagitis: Secondary | ICD-10-CM | POA: Insufficient documentation

## 2020-05-29 DIAGNOSIS — G629 Polyneuropathy, unspecified: Secondary | ICD-10-CM | POA: Insufficient documentation

## 2020-05-29 DIAGNOSIS — R051 Acute cough: Secondary | ICD-10-CM | POA: Diagnosis not present

## 2020-06-19 DIAGNOSIS — E119 Type 2 diabetes mellitus without complications: Secondary | ICD-10-CM | POA: Diagnosis not present

## 2020-06-19 DIAGNOSIS — E785 Hyperlipidemia, unspecified: Secondary | ICD-10-CM | POA: Diagnosis not present

## 2020-06-26 DIAGNOSIS — H409 Unspecified glaucoma: Secondary | ICD-10-CM | POA: Diagnosis not present

## 2020-06-26 DIAGNOSIS — Z8546 Personal history of malignant neoplasm of prostate: Secondary | ICD-10-CM | POA: Diagnosis not present

## 2020-06-26 DIAGNOSIS — E782 Mixed hyperlipidemia: Secondary | ICD-10-CM | POA: Diagnosis not present

## 2020-06-26 DIAGNOSIS — I739 Peripheral vascular disease, unspecified: Secondary | ICD-10-CM | POA: Diagnosis not present

## 2020-06-26 DIAGNOSIS — R809 Proteinuria, unspecified: Secondary | ICD-10-CM | POA: Diagnosis not present

## 2020-06-26 DIAGNOSIS — N401 Enlarged prostate with lower urinary tract symptoms: Secondary | ICD-10-CM | POA: Insufficient documentation

## 2020-06-26 DIAGNOSIS — E1169 Type 2 diabetes mellitus with other specified complication: Secondary | ICD-10-CM | POA: Diagnosis not present

## 2020-06-26 DIAGNOSIS — R030 Elevated blood-pressure reading, without diagnosis of hypertension: Secondary | ICD-10-CM | POA: Diagnosis not present

## 2020-06-26 DIAGNOSIS — D638 Anemia in other chronic diseases classified elsewhere: Secondary | ICD-10-CM | POA: Diagnosis not present

## 2020-06-26 DIAGNOSIS — M4802 Spinal stenosis, cervical region: Secondary | ICD-10-CM | POA: Diagnosis not present

## 2020-07-03 DIAGNOSIS — R69 Illness, unspecified: Secondary | ICD-10-CM | POA: Diagnosis not present

## 2020-07-03 DIAGNOSIS — K219 Gastro-esophageal reflux disease without esophagitis: Secondary | ICD-10-CM | POA: Diagnosis not present

## 2020-08-01 ENCOUNTER — Other Ambulatory Visit: Payer: Self-pay

## 2020-08-01 DIAGNOSIS — N401 Enlarged prostate with lower urinary tract symptoms: Secondary | ICD-10-CM

## 2020-08-01 DIAGNOSIS — N138 Other obstructive and reflux uropathy: Secondary | ICD-10-CM

## 2020-08-01 MED ORDER — SILODOSIN 8 MG PO CAPS: 8.0000 mg | ORAL_CAPSULE | Freq: Every day | ORAL | 11 refills | Status: DC

## 2020-08-05 DIAGNOSIS — E1140 Type 2 diabetes mellitus with diabetic neuropathy, unspecified: Secondary | ICD-10-CM | POA: Diagnosis not present

## 2020-08-05 DIAGNOSIS — M79672 Pain in left foot: Secondary | ICD-10-CM | POA: Diagnosis not present

## 2020-08-05 DIAGNOSIS — I739 Peripheral vascular disease, unspecified: Secondary | ICD-10-CM | POA: Diagnosis not present

## 2020-08-05 DIAGNOSIS — M79671 Pain in right foot: Secondary | ICD-10-CM | POA: Diagnosis not present

## 2020-08-05 DIAGNOSIS — H40023 Open angle with borderline findings, high risk, bilateral: Secondary | ICD-10-CM | POA: Diagnosis not present

## 2020-08-15 ENCOUNTER — Telehealth: Payer: Self-pay

## 2020-08-15 NOTE — Telephone Encounter (Signed)
Patient letting Dr. Alyson Ingles know the silodosin (RAPAFLO) 8 MG CAPS capsule is not making much difference from alfuzosin (UROXATRAL) 10 MG 24 hr tablet.    Patient has been passing some blood during urination for 1 week.  Please advise.  Call back:  [phone] (H)   Thanks, Helene Kelp

## 2020-08-15 NOTE — Telephone Encounter (Signed)
See below. Please advise.  

## 2020-08-20 NOTE — Telephone Encounter (Signed)
Patient was taking uroxatral bid before trying rapaflo. Patient states that there is little difference in the two but would rather stay on rapaflo. Patient asked if he could increase the rapaflo to bid. Please advise.

## 2020-08-20 NOTE — Telephone Encounter (Signed)
Patient returned the phone call.  He is willing to try the new medication.  Please send in to [facility] in [place].  Thank you

## 2020-08-26 ENCOUNTER — Other Ambulatory Visit: Payer: Self-pay

## 2020-08-26 ENCOUNTER — Other Ambulatory Visit: Payer: Self-pay | Admitting: Urology

## 2020-08-26 DIAGNOSIS — N401 Enlarged prostate with lower urinary tract symptoms: Secondary | ICD-10-CM

## 2020-08-26 DIAGNOSIS — N138 Other obstructive and reflux uropathy: Secondary | ICD-10-CM

## 2020-08-26 MED ORDER — SILODOSIN 8 MG PO CAPS: 8.0000 mg | ORAL_CAPSULE | Freq: Two times a day (BID) | ORAL | 11 refills | Status: DC

## 2020-08-26 NOTE — Telephone Encounter (Signed)
Rx updated and patient called and made aware.

## 2020-09-18 DIAGNOSIS — R69 Illness, unspecified: Secondary | ICD-10-CM | POA: Diagnosis not present

## 2020-09-18 DIAGNOSIS — K219 Gastro-esophageal reflux disease without esophagitis: Secondary | ICD-10-CM | POA: Diagnosis not present

## 2020-09-24 DIAGNOSIS — E782 Mixed hyperlipidemia: Secondary | ICD-10-CM | POA: Diagnosis not present

## 2020-09-24 DIAGNOSIS — E1169 Type 2 diabetes mellitus with other specified complication: Secondary | ICD-10-CM | POA: Diagnosis not present

## 2020-10-02 DIAGNOSIS — R809 Proteinuria, unspecified: Secondary | ICD-10-CM | POA: Diagnosis not present

## 2020-10-02 DIAGNOSIS — D638 Anemia in other chronic diseases classified elsewhere: Secondary | ICD-10-CM | POA: Diagnosis not present

## 2020-10-02 DIAGNOSIS — E1169 Type 2 diabetes mellitus with other specified complication: Secondary | ICD-10-CM | POA: Diagnosis not present

## 2020-10-02 DIAGNOSIS — R030 Elevated blood-pressure reading, without diagnosis of hypertension: Secondary | ICD-10-CM | POA: Diagnosis not present

## 2020-10-02 DIAGNOSIS — Z23 Encounter for immunization: Secondary | ICD-10-CM | POA: Diagnosis not present

## 2020-10-02 DIAGNOSIS — N401 Enlarged prostate with lower urinary tract symptoms: Secondary | ICD-10-CM | POA: Diagnosis not present

## 2020-10-02 DIAGNOSIS — E782 Mixed hyperlipidemia: Secondary | ICD-10-CM | POA: Diagnosis not present

## 2020-10-02 DIAGNOSIS — Z8546 Personal history of malignant neoplasm of prostate: Secondary | ICD-10-CM | POA: Diagnosis not present

## 2020-10-02 DIAGNOSIS — Z0001 Encounter for general adult medical examination with abnormal findings: Secondary | ICD-10-CM | POA: Diagnosis not present

## 2020-10-02 DIAGNOSIS — H409 Unspecified glaucoma: Secondary | ICD-10-CM | POA: Diagnosis not present

## 2020-10-14 DIAGNOSIS — I739 Peripheral vascular disease, unspecified: Secondary | ICD-10-CM | POA: Diagnosis not present

## 2020-10-14 DIAGNOSIS — M79671 Pain in right foot: Secondary | ICD-10-CM | POA: Diagnosis not present

## 2020-10-14 DIAGNOSIS — M79672 Pain in left foot: Secondary | ICD-10-CM | POA: Diagnosis not present

## 2020-10-14 DIAGNOSIS — E1140 Type 2 diabetes mellitus with diabetic neuropathy, unspecified: Secondary | ICD-10-CM | POA: Diagnosis not present

## 2020-10-18 DIAGNOSIS — K219 Gastro-esophageal reflux disease without esophagitis: Secondary | ICD-10-CM | POA: Diagnosis not present

## 2020-10-18 DIAGNOSIS — F419 Anxiety disorder, unspecified: Secondary | ICD-10-CM | POA: Diagnosis not present

## 2020-10-18 DIAGNOSIS — R69 Illness, unspecified: Secondary | ICD-10-CM | POA: Diagnosis not present

## 2020-11-18 DIAGNOSIS — R69 Illness, unspecified: Secondary | ICD-10-CM | POA: Diagnosis not present

## 2020-11-18 DIAGNOSIS — K219 Gastro-esophageal reflux disease without esophagitis: Secondary | ICD-10-CM | POA: Diagnosis not present

## 2020-11-18 DIAGNOSIS — F419 Anxiety disorder, unspecified: Secondary | ICD-10-CM | POA: Diagnosis not present

## 2020-11-21 ENCOUNTER — Other Ambulatory Visit: Payer: Self-pay

## 2020-11-21 ENCOUNTER — Other Ambulatory Visit: Payer: Medicare HMO

## 2020-11-21 DIAGNOSIS — C61 Malignant neoplasm of prostate: Secondary | ICD-10-CM | POA: Diagnosis not present

## 2020-11-22 ENCOUNTER — Other Ambulatory Visit: Payer: Medicare HMO

## 2020-11-22 LAB — PSA: Prostate Specific Ag, Serum: 10.9 ng/mL — ABNORMAL HIGH (ref 0.0–4.0)

## 2020-11-29 ENCOUNTER — Ambulatory Visit: Payer: Medicare HMO | Admitting: Urology

## 2020-12-18 DIAGNOSIS — F419 Anxiety disorder, unspecified: Secondary | ICD-10-CM | POA: Diagnosis not present

## 2020-12-18 DIAGNOSIS — K219 Gastro-esophageal reflux disease without esophagitis: Secondary | ICD-10-CM | POA: Diagnosis not present

## 2020-12-18 DIAGNOSIS — R69 Illness, unspecified: Secondary | ICD-10-CM | POA: Diagnosis not present

## 2020-12-30 DIAGNOSIS — M79672 Pain in left foot: Secondary | ICD-10-CM | POA: Diagnosis not present

## 2020-12-30 DIAGNOSIS — I739 Peripheral vascular disease, unspecified: Secondary | ICD-10-CM | POA: Diagnosis not present

## 2020-12-30 DIAGNOSIS — E1140 Type 2 diabetes mellitus with diabetic neuropathy, unspecified: Secondary | ICD-10-CM | POA: Diagnosis not present

## 2020-12-30 DIAGNOSIS — M79671 Pain in right foot: Secondary | ICD-10-CM | POA: Diagnosis not present

## 2020-12-31 ENCOUNTER — Other Ambulatory Visit: Payer: Self-pay

## 2020-12-31 ENCOUNTER — Telehealth: Payer: Self-pay

## 2020-12-31 DIAGNOSIS — N138 Other obstructive and reflux uropathy: Secondary | ICD-10-CM

## 2020-12-31 DIAGNOSIS — N401 Enlarged prostate with lower urinary tract symptoms: Secondary | ICD-10-CM

## 2020-12-31 MED ORDER — SILODOSIN 8 MG PO CAPS: 8.0000 mg | ORAL_CAPSULE | Freq: Two times a day (BID) | ORAL | 11 refills | Status: DC

## 2020-12-31 NOTE — Telephone Encounter (Signed)
Refill submitted. 

## 2020-12-31 NOTE — Telephone Encounter (Signed)
Patient needing refill sent to new pharmacy. Please fill asap.  Pt is out.  silodosin (RAPAFLO) 8 MG CAPS capsule   Please fill at: [facility] - [place], [place] - [address] Phone:  [phone]  Fax:  [phone]     Does not use [facility]  Thanks, Helene Kelp

## 2021-01-01 ENCOUNTER — Other Ambulatory Visit: Payer: Self-pay

## 2021-01-01 DIAGNOSIS — N138 Other obstructive and reflux uropathy: Secondary | ICD-10-CM

## 2021-01-01 MED ORDER — SILODOSIN 8 MG PO CAPS: 8.0000 mg | ORAL_CAPSULE | Freq: Two times a day (BID) | ORAL | 11 refills | Status: DC

## 2021-01-01 NOTE — Progress Notes (Unsigned)
Received phone call from pharmacy about Rapaflo script only being written for 30 caps. Prescription was updated to 60caps to be able to take 2x daily.

## 2021-01-07 ENCOUNTER — Emergency Department (HOSPITAL_COMMUNITY)
Admission: EM | Admit: 2021-01-07 | Discharge: 2021-01-07 | Disposition: A | Discharge status: Home / Self Care | Payer: Medicare HMO | Attending: Emergency Medicine | Admitting: Emergency Medicine

## 2021-01-07 ENCOUNTER — Emergency Department (HOSPITAL_COMMUNITY): Payer: Medicare HMO

## 2021-01-07 ENCOUNTER — Encounter (HOSPITAL_COMMUNITY): Payer: Self-pay | Admitting: *Deleted

## 2021-01-07 DIAGNOSIS — Z20822 Contact with and (suspected) exposure to covid-19: Secondary | ICD-10-CM | POA: Insufficient documentation

## 2021-01-07 DIAGNOSIS — E119 Type 2 diabetes mellitus without complications: Secondary | ICD-10-CM | POA: Insufficient documentation

## 2021-01-07 DIAGNOSIS — Z7984 Long term (current) use of oral hypoglycemic drugs: Secondary | ICD-10-CM | POA: Diagnosis not present

## 2021-01-07 DIAGNOSIS — J9811 Atelectasis: Secondary | ICD-10-CM | POA: Diagnosis not present

## 2021-01-07 DIAGNOSIS — Z79899 Other long term (current) drug therapy: Secondary | ICD-10-CM | POA: Insufficient documentation

## 2021-01-07 DIAGNOSIS — R0789 Other chest pain: Secondary | ICD-10-CM | POA: Diagnosis not present

## 2021-01-07 DIAGNOSIS — K219 Gastro-esophageal reflux disease without esophagitis: Secondary | ICD-10-CM | POA: Insufficient documentation

## 2021-01-07 DIAGNOSIS — R030 Elevated blood-pressure reading, without diagnosis of hypertension: Secondary | ICD-10-CM

## 2021-01-07 DIAGNOSIS — Z87891 Personal history of nicotine dependence: Secondary | ICD-10-CM | POA: Diagnosis not present

## 2021-01-07 DIAGNOSIS — Z8546 Personal history of malignant neoplasm of prostate: Secondary | ICD-10-CM | POA: Diagnosis not present

## 2021-01-07 DIAGNOSIS — R079 Chest pain, unspecified: Secondary | ICD-10-CM | POA: Insufficient documentation

## 2021-01-07 DIAGNOSIS — I10 Essential (primary) hypertension: Secondary | ICD-10-CM | POA: Insufficient documentation

## 2021-01-07 DIAGNOSIS — R072 Precordial pain: Secondary | ICD-10-CM

## 2021-01-07 LAB — CBC
HCT: 38.3 % — ABNORMAL LOW (ref 39.0–52.0)
Hemoglobin: 12.4 g/dL — ABNORMAL LOW (ref 13.0–17.0)
MCH: 28.6 pg (ref 26.0–34.0)
MCHC: 32.4 g/dL (ref 30.0–36.0)
MCV: 88.5 fL (ref 80.0–100.0)
Platelets: 204 10*3/uL (ref 150–400)
RBC: 4.33 MIL/uL (ref 4.22–5.81)
RDW: 13.1 % (ref 11.5–15.5)
WBC: 6.6 10*3/uL (ref 4.0–10.5)
nRBC: 0 % (ref 0.0–0.2)

## 2021-01-07 LAB — BASIC METABOLIC PANEL
Anion gap: 9 (ref 5–15)
BUN: 25 mg/dL — ABNORMAL HIGH (ref 8–23)
CO2: 28 mmol/L (ref 22–32)
Calcium: 9 mg/dL (ref 8.9–10.3)
Chloride: 103 mmol/L (ref 98–111)
Creatinine, Ser: 1.22 mg/dL (ref 0.61–1.24)
GFR, Estimated: >60 mL/min (ref >60)
Glucose, Bld: 143 mg/dL — ABNORMAL HIGH (ref 70–99)
Potassium: 4.1 mmol/L (ref 3.5–5.1)
Sodium: 140 mmol/L (ref 135–145)

## 2021-01-07 LAB — RESP PANEL BY RT-PCR (FLU A&B, COVID) ARPGX2
Influenza A by PCR: NEGATIVE
Influenza B by PCR: NEGATIVE
SARS Coronavirus 2 by RT PCR: NEGATIVE

## 2021-01-07 LAB — TROPONIN I (HIGH SENSITIVITY)
Troponin I (High Sensitivity): 18 ng/L — ABNORMAL HIGH (ref <18)
Troponin I (High Sensitivity): 20 ng/L — ABNORMAL HIGH (ref <18)

## 2021-01-07 NOTE — Discharge Instructions (Addendum)
It was our pleasure to provide your ER care today - we hope that you feel better.  Take an enteric coated aspirin a day.   Follow up very closely with your cardiologist in the coming week - call office tomorrow AM to arrange appointment.  Also follow up for your blood pressure as it is high tonight.   Return to [facility] ER right away if worse, persistent or recurrent chest pain, increased trouble breathing, or other concern.

## 2021-01-07 NOTE — ED Provider Notes (Signed)
[facility] EMERGENCY DEPARTMENT Provider Note   CSN: [id number] Arrival date & time: 01/07/21  1638     History Chief Complaint  Patient presents with   Chest Pain    [facility] Brooke Bonito. is a 75 y.o. male.  Pt with hx htn presents with chest discomfort around noon today. Symptoms acute onset, at rest, dull, mid chest, non radiating, not pleuritic. Mild sob. No nv or diaphoresis. States had remote similar pain once or twice but was brief and went away - pt unsure of cause. Reports seeing cardiologist and ?neg stress test in past. Denies cath. Denies fam hx cad. No exertional cp or discomfort. No hx dvt or pe. No leg swelling/pain. No cough or uri symptoms. No fever or chills. No back or flank pain. No abd pain. Took an aspirin today (does not take regularly)  The history is provided by the patient and medical records.  Chest Pain Associated symptoms: no abdominal pain, no back pain, no cough, no fever, no headache, no nausea, no palpitations and no vomiting       Past Medical History:  Diagnosis Date   Cancer [facility])    prostate   Cervical stenosis of spine    Claudication ([place])    Claudication ([place])    Diabetes mellitus without complication ([place])    GERD (gastroesophageal reflux disease)    Glaucoma (increased eye pressure)    Hyperlipidemia    Hypertension    Wears glasses     Patient Active Problem List   Diagnosis Date Noted   Malignant neoplasm of prostate ([place]) 04/19/2019   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/19/2019   Incomplete emptying of bladder 04/19/2019   Claudication ([place]) 03/15/2019   Essential hypertension 03/15/2019   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   Esophageal reflux 04/10/2012    Past Surgical History:  Procedure Laterality Date   fracture back     HERNIA REPAIR     POSTERIOR CERVICAL FUSION/FORAMINOTOMY N/A 09/10/2017   Procedure:  LAMINECTOMY CERVICAL FOUR - CERVICAL FIVE, CERVICAL FIVE - CERVICAL SIX, CERVICAL SIX - CERVICAL SEVEN, POSTERIOR SEGMENTAL INSTRUMENTATION, POSTERIOR LATERAL ARTHRODESIS;  Surgeon: Consuella Lose, MD;  Location: [place];  Service: Neurosurgery;  Laterality: N/A;  LAMINECTOMY CERVICAL FOUR - CERVICAL FIVE, CERVICAL FIVE - CERVICAL SIX, CERVICAL SIX - CERVICAL SEVEN, POSTERIOR SEGMENTAL       Family History  Problem Relation Age of Onset   Diabetes Mother    Lupus Mother    Prostate cancer Father     Social History   Tobacco Use   Smoking status: Former    Types: Cigarettes    Quit date: 03/21/2014    Years since quitting: 6.8   Smokeless tobacco: Never  Vaping Use   Vaping Use: Never used  Substance Use Topics   Alcohol use: Yes    Comment: occ   Drug use: No    Home Medications Prior to Admission medications   Medication Sig Start Date End Date Taking? Authorizing Provider  alfuzosin (UROXATRAL) 10 MG 24 hr tablet Take 1 tablet (10 mg total) by mouth in the morning and at bedtime. 11/23/19   McKenzie, Candee Furbish, MD  cilostazol (PLETAL) 50 MG tablet TAKE 1 TABLET BY MOUTH TWICE A DAY 11/28/18   Patwardhan, Manish J, MD  esomeprazole (NEXIUM) 40 MG capsule Take 40 mg by mouth daily. 04/14/19   [provider]  glipiZIDE (GLUCOTROL) 5 MG tablet Take  5 mg by mouth 3 (three) times daily with meals. 03/09/19   [provider]  metFORMIN (GLUCOPHAGE) 500 MG tablet Take 500 mg by mouth 2 (two) times daily. 02/24/18   [provider]  Multiple Vitamins-Minerals (MULTIVITAMIN WITH MINERALS) tablet Take 1 tablet by mouth daily.    [provider]  [facility] ULTRA test strip 1 each daily. 09/12/19   [provider]  rosuvastatin (CRESTOR) 20 MG tablet Take 1 tablet (20 mg total) by mouth daily. 05/09/18   Patwardhan, Reynold Bowen, MD  silodosin (RAPAFLO) 8 MG CAPS capsule Take 1 capsule (8 mg total) by mouth 2 (two) times daily. 01/01/21   McKenzie, Candee Furbish, MD    Allergies    Other, Augmentin [amoxicillin-pot clavulanate], Hydrocodone, and Oxycontin [oxycodone hcl]  Review of Systems   Review of Systems  Constitutional:  Negative for chills and fever.  HENT:  Negative for sore throat.   Eyes:  Negative for redness.  Respiratory:  Negative for cough.   Cardiovascular:  Positive for chest pain. Negative for palpitations and leg swelling.  Gastrointestinal:  Negative for abdominal pain, nausea and vomiting.  Genitourinary:  Negative for flank pain.  Musculoskeletal:  Negative for back pain and neck pain.  Skin:  Negative for rash.  Neurological:  Negative for headaches.  Hematological:  Does not bruise/bleed easily.  Psychiatric/Behavioral:  Negative for confusion.    Physical Exam Updated Vital Signs BP 133/65 (BP Location: Right Arm)    Pulse 90    Temp 98 F (36.7 C) (Oral)    Resp 17    SpO2 95%   Physical Exam Vitals and nursing note reviewed.  Constitutional:      Appearance: Normal appearance. He is well-developed.  HENT:     Head: Atraumatic.     Nose: Nose normal.     Mouth/Throat:     Mouth: Mucous membranes are moist.  Eyes:     General: No scleral icterus.    Conjunctiva/sclera: Conjunctivae normal.  Neck:     Trachea: No tracheal deviation.  Cardiovascular:     Rate and Rhythm: Normal rate and regular rhythm.     Pulses: Normal pulses.     Heart sounds: Normal heart sounds. No murmur heard.   No friction rub. No gallop.  Pulmonary:     Effort: Pulmonary effort is normal. No accessory muscle usage or respiratory distress.     Breath sounds: Normal breath sounds.  Chest:     Chest wall: No tenderness.  Abdominal:     General: Bowel sounds are normal. There is no distension.     Palpations: Abdomen is soft.     Tenderness: There is no abdominal tenderness. There is no guarding.  Genitourinary:    Comments: No cva tenderness. Musculoskeletal:        General: No swelling or tenderness.     Cervical back:  Normal range of motion and neck supple. No rigidity.     Right lower leg: No edema.     Left lower leg: No edema.  Skin:    General: Skin is warm and dry.     Findings: No rash.  Neurological:     Mental Status: He is alert.     Comments: Alert, speech clear.   Psychiatric:        Mood and Affect: Mood normal.    ED Results / Procedures / Treatments   Labs (all labs ordered are listed, but only abnormal results are displayed) Results for  orders placed or performed during the hospital encounter of 01/07/21  Resp Panel by RT-PCR (Flu A&B, Covid) Nasopharyngeal Swab   Specimen: Nasopharyngeal Swab; Nasopharyngeal(NP) swabs in vial transport medium  Result Value Ref Range   SARS Coronavirus 2 by RT PCR NEGATIVE NEGATIVE   Influenza A by PCR NEGATIVE NEGATIVE   Influenza B by PCR NEGATIVE NEGATIVE  Basic metabolic panel  Result Value Ref Range   Sodium 140 135 - 145 mmol/L   Potassium 4.1 3.5 - 5.1 mmol/L   Chloride 103 98 - 111 mmol/L   CO2 28 22 - 32 mmol/L   Glucose, Bld 143 (H) 70 - 99 mg/dL   BUN 25 (H) 8 - 23 mg/dL   Creatinine, Ser 1.22 0.61 - 1.24 mg/dL   Calcium 9.0 8.9 - 10.3 mg/dL   GFR, Estimated >60 >60 mL/min   Anion gap 9 5 - 15  CBC  Result Value Ref Range   WBC 6.6 4.0 - 10.5 K/uL   RBC 4.33 4.22 - 5.81 MIL/uL   Hemoglobin 12.4 (L) 13.0 - 17.0 g/dL   HCT 38.3 (L) 39.0 - 52.0 %   MCV 88.5 80.0 - 100.0 fL   MCH 28.6 26.0 - 34.0 pg   MCHC 32.4 30.0 - 36.0 g/dL   RDW 13.1 11.5 - 15.5 %   Platelets 204 150 - 400 K/uL   nRBC 0.0 0.0 - 0.2 %  Troponin I (High Sensitivity)  Result Value Ref Range   Troponin I (High Sensitivity) 18 (H) <18 ng/L  Troponin I (High Sensitivity)  Result Value Ref Range   Troponin I (High Sensitivity) 20 (H) <18 ng/L      EKG EKG Interpretation  Date/Time:  Tuesday January 07 2021 16:49:29 EST Ventricular Rate:  94 PR Interval:  142 QRS Duration: 94 QT Interval:  368 QTC Calculation: 460 R Axis:   105 Text  Interpretation: Sinus rhythm with frequent Premature ventricular complexes Rightward axis Nonspecific ST abnormality Confirmed by Lajean Saver [phone]) on 01/07/2021 4:56:42 PM  Radiology DG Chest 2 View  Result Date: 01/07/2021 CLINICAL DATA:  Chest pain. EXAM: CHEST - 2 VIEW COMPARISON:  Chest x-ray 11/25/2010. FINDINGS: Minimal left infrahilar opacities present. There is no pleural effusion or pneumothorax. Cardiomediastinal silhouette is within normal limits. Cervical spinal fusion hardware is present. IMPRESSION: 1. Minimal left infrahilar atelectasis/airspace disease. Electronically Signed   By: Ronney Asters M.D.   On: 01/07/2021 17:18    Procedures Procedures   Medications Ordered in ED Medications - No data to display  ED Course  I have reviewed the triage vital signs and the nursing notes.  Pertinent labs & imaging results that were available during my care of the patient were reviewed by me and considered in my medical decision making (see chart for details).    MDM Rules/Calculators/A&P                         Iv ns. Stat labs. Ecg. Cxr.   Reviewed nursing notes and prior charts for additional history.   Labs reviewed/interpreted by me - wbc normal, lytes normal. Initial trop 18.  CXR reviewed/interpreted by me - atelectasis  Recheck pt, no chest pain.   Additional labs reviewed/interpreted by me - deltra trop not significantly increased from prior (delta < 5).   On additional recheck, pt denies any chest pain, indicates feels fine, ready for d/c. Pt indicates has cardiologist in [place] he can/will f/u with closely.  Rec asa q day, and close cardiology f/u.  Return precautions provided.      Final Clinical Impression(s) / ED Diagnoses Final diagnoses:  None    Rx / DC Orders ED Discharge Orders     None        Lajean Saver, MD 01/07/21 2052

## 2021-01-07 NOTE — ED Triage Notes (Signed)
Chest pain onset noon today

## 2021-01-07 NOTE — ED Notes (Signed)
Lab called to draw second troponin

## 2021-01-10 ENCOUNTER — Ambulatory Visit: Payer: Medicare HMO | Admitting: Urology

## 2021-01-15 ENCOUNTER — Ambulatory Visit: Payer: Medicare HMO | Admitting: Student

## 2021-01-15 ENCOUNTER — Other Ambulatory Visit: Payer: Self-pay

## 2021-01-15 ENCOUNTER — Encounter: Payer: Self-pay | Admitting: Student

## 2021-01-15 VITALS — BP 148/88 | HR 82 | Temp 98.2°F | Ht 71.0 in | Wt 209.0 lb

## 2021-01-15 DIAGNOSIS — I351 Nonrheumatic aortic (valve) insufficiency: Secondary | ICD-10-CM | POA: Diagnosis not present

## 2021-01-15 DIAGNOSIS — R0989 Other specified symptoms and signs involving the circulatory and respiratory systems: Secondary | ICD-10-CM

## 2021-01-15 DIAGNOSIS — I209 Angina pectoris, unspecified: Secondary | ICD-10-CM | POA: Diagnosis not present

## 2021-01-15 MED ORDER — LOSARTAN POTASSIUM 25 MG PO TABS: 25.0000 mg | ORAL_TABLET | Freq: Every day | ORAL | 3 refills | Status: DC

## 2021-01-15 NOTE — Progress Notes (Signed)
Patient is here for follow up visit.  Subjective:   Grant Most., male    DOB: 1945/04/17, 74 y.o.   MRN: [id number]   Chief Complaint  Patient presents with   Chest Pain   Hospitalization Follow-up    HPI  75 y.o. Caucasian male with hyperlipidemia, hypertension, diabetes, claudication with mildly reduced b/l ABI.  Patient was evaluated in the emergency department for chest pain 01/07/2021 at which time EKG was without acute ischemic changes, troponin negative x2, and chest x-ray without abnormalities.  Patient's blood pressure was elevated at the time of presentation to ED.  Symptoms resolved and patient was discharged.  He now presents for follow-up in our office.  Patient reports over the last 1 year he has had intermittent episodes of chest pain 1-2 times per month lasting approximately 10 minutes, primarily associated with exertion and relieved by rest.  He reports dyspnea on exertion associated with these episodes.  He does not monitor home blood pressure readings.  Patient denies symptoms of claudication.  Denies orthopnea, PND, leg edema.  He is not open to antihypertensive medications  Current Outpatient Medications on File Prior to Visit  Medication Sig Dispense Refill   aspirin 325 MG tablet Take 650 mg by mouth every 4 (four) hours as needed.     bismuth subsalicylate (PEPTO BISMOL) 262 MG/15ML suspension Take 30 mLs by mouth every 6 (six) hours as needed.     cilostazol (PLETAL) 50 MG tablet TAKE 1 TABLET BY MOUTH TWICE A DAY 180 tablet 1   esomeprazole (NEXIUM) 40 MG capsule Take 40 mg by mouth daily.     glipiZIDE (GLUCOTROL) 5 MG tablet Take 5 mg by mouth 3 (three) times daily with meals.     latanoprost (XALATAN) 0.005 % ophthalmic solution Place 1 drop into both eyes at bedtime.     metFORMIN (GLUCOPHAGE) 500 MG tablet Take 500 mg by mouth in the morning, at noon, and at bedtime.     Multiple Vitamins-Minerals (MULTIVITAMIN WITH MINERALS) tablet Take 1  tablet by mouth daily.     ONETOUCH ULTRA test strip 1 each daily.     rosuvastatin (CRESTOR) 20 MG tablet Take 1 tablet (20 mg total) by mouth daily. 90 tablet 3   silodosin (RAPAFLO) 8 MG CAPS capsule Take 1 capsule (8 mg total) by mouth 2 (two) times daily. (Patient not taking: Reported on 01/15/2021) 60 capsule 11   No current facility-administered medications on file prior to visit.    Cardiovascular studies: EKG 01/07/2021: Sinus rhythm at a rate of 85 bpm.  Normal axis.  No evidence of ischemia or underlying injury pattern.  Compared EKG 03/15/2019, no significant change  EKG 03/15/2019: Sinus rhythm 82 bpm. Normal EKG.  Echocardiogram 03/14/2019: Left ventricle cavity is normal in size. Moderate concentric hypertrophy of the left ventricle. Normal LV systolic function with EF 56%. Normal global wall motion. Doppler evidence of grade I (impaired) diastolic dysfunction, normal LAP.  Trileaflet aortic valve. Trace aortic stenosis. Mild (Grade I) aortic regurgitation. Mild (Grade I) mitral regurgitation. IVC is dilated with respiratory variation. Estimated RA pressure 8 mmHg. No significant change compared to previous study on 04/29/2017.   Recent labs: CMP Latest Ref Rng & Units 01/07/2021 09/11/2017 08/29/2017  Glucose 70 - 99 mg/dL 143(H) 367(H) 174(H)  BUN 8 - 23 mg/dL 25(H) 23 19  Creatinine 0.61 - 1.24 mg/dL 1.22 1.08 1.12  Sodium 135 - 145 mmol/L 140 133(L) 139  Potassium 3.5 - 5.1  mmol/L 4.1 4.4 4.2  Chloride 98 - 111 mmol/L 103 96(L) 102  CO2 22 - 32 mmol/L 28 25 28   Calcium 8.9 - 10.3 mg/dL 9.0 8.6(L) 9.0   CBC Latest Ref Rng & Units 01/07/2021 09/11/2017 08/29/2017  WBC 4.0 - 10.5 K/uL 6.6 10.7(H) 5.6  Hemoglobin 13.0 - 17.0 g/dL 12.4(L) 12.3(L) 12.0(L)  Hematocrit 39.0 - 52.0 % 38.3(L) 37.8(L) 36.1(L)  Platelets 150 - 400 K/uL 204 158 177   Lipid Panel  No results found for: CHOL, TRIG, HDL, CHOLHDL, VLDL, LDLCALC, LDLDIRECT HEMOGLOBIN A1C No results found for:  HGBA1C, MPG TSH No results for input(s): TSH in the last 8760 hours.  External Labs: 02/09/2019: Glucose 98. BUN/Cr 19/0.9. Chol 101, TG 126, HDL 40 HbA1C 6.6%  07/26/2019: LDL 34   Review of Systems  Cardiovascular:  Positive for chest pain and dyspnea on exertion. Negative for leg swelling, palpitations and syncope.  Musculoskeletal:  Positive for falls.      Objective:    Vitals:   01/15/21 1141 01/15/21 1151  BP: (!) 174/81 (!) 148/88  Pulse: 85 82  Temp: 98.2 F (36.8 C)   SpO2: 95% 95%     Physical Exam Vitals and nursing note reviewed.  Constitutional:      General: He is not in acute distress.    Appearance: He is well-developed.  Neck:     Vascular: No JVD.  Cardiovascular:     Rate and Rhythm: Normal rate and regular rhythm.     Pulses:          Carotid pulses are  on the right side with bruit.      Dorsalis pedis pulses are 0 on the right side and 0 on the left side.       Posterior tibial pulses are 1+ on the right side and 1+ on the left side.     Heart sounds: Murmur (I/VIearly diastolic murmur LLSB) heard.  Pulmonary:     Effort: Pulmonary effort is normal.     Breath sounds: Normal breath sounds. No wheezing or rales.  Lymphadenopathy:     Cervical: No cervical adenopathy.  Skin:    General: Skin is warm and dry.  Neurological:     Mental Status: He is alert and oriented to person, place, and time.     Cranial Nerves: No cranial nerve deficit.        Assessment & Recommendations:   75 y.o. Caucasian male with hyperlipidemia, hypertension, diabetes, claudication with mildly reduced b/l ABI.  Angina pectoris: Patient's symptoms are classically with exertion and relieved by rest and associated with dyspnea on exertion.  EKG is without acute ischemic changes.  Patient is not actively having chest pain. Will obtain nuclear stress test given multiple cardiovascular risk factors and chest pain.  Due to dyspnea on exertion patient is not a  treadmill candidate, will therefore obtain Lexiscan nuclear stress test. Counseled patient regarding sign/symptoms that would warrant urgent/emergent evaluation, he and his wife verbalized understanding and agreement.   Dyspnea on exertion: Will obtain stress test to evaluate for underlying CAD We will also obtain echocardiogram given worsening dyspnea on exertion and history of valvular disease.  Nonrheumatic aortic valve insufficiency Due to worsening shortness of breath recommend repeat echocardiogram  Essential hypertension Remains uncontrolled. Given history of diabetes and uncontrolled hypertension, recommend starting losartan 25 mg daily.   Patient is scheduled for annual labs with PCP in approximately 10 days, will therefore defer repeat BMP to PCPs office.  Have  requested that results be sent to me for review.  Claudication ([place]) Symptoms well controlled. Continue cilostazol.  Continue statin, metformin.  Right carotid bruit: Will obtain carotid artery duplex   Alethia Berthold, PA-C 01/15/2021, 12:56 PM Office: [phone]

## 2021-01-17 DIAGNOSIS — F419 Anxiety disorder, unspecified: Secondary | ICD-10-CM | POA: Diagnosis not present

## 2021-01-17 DIAGNOSIS — K219 Gastro-esophageal reflux disease without esophagitis: Secondary | ICD-10-CM | POA: Diagnosis not present

## 2021-01-17 DIAGNOSIS — R69 Illness, unspecified: Secondary | ICD-10-CM | POA: Diagnosis not present

## 2021-01-22 DIAGNOSIS — E1169 Type 2 diabetes mellitus with other specified complication: Secondary | ICD-10-CM | POA: Diagnosis not present

## 2021-01-22 DIAGNOSIS — E782 Mixed hyperlipidemia: Secondary | ICD-10-CM | POA: Diagnosis not present

## 2021-01-24 ENCOUNTER — Ambulatory Visit: Payer: Medicare HMO | Admitting: Urology

## 2021-01-24 ENCOUNTER — Other Ambulatory Visit: Payer: Self-pay

## 2021-01-24 ENCOUNTER — Encounter: Payer: Self-pay | Admitting: Urology

## 2021-01-24 VITALS — BP 137/66 | HR 83 | Ht 71.0 in | Wt 209.0 lb

## 2021-01-24 DIAGNOSIS — N138 Other obstructive and reflux uropathy: Secondary | ICD-10-CM | POA: Diagnosis not present

## 2021-01-24 DIAGNOSIS — C61 Malignant neoplasm of prostate: Secondary | ICD-10-CM

## 2021-01-24 DIAGNOSIS — R339 Retention of urine, unspecified: Secondary | ICD-10-CM

## 2021-01-24 DIAGNOSIS — N401 Enlarged prostate with lower urinary tract symptoms: Secondary | ICD-10-CM

## 2021-01-24 MED ORDER — FINASTERIDE 5 MG PO TABS: 5.0000 mg | ORAL_TABLET | Freq: Every day | ORAL | 3 refills | Status: DC

## 2021-01-24 MED ORDER — SILODOSIN 8 MG PO CAPS: 8.0000 mg | ORAL_CAPSULE | Freq: Two times a day (BID) | ORAL | 11 refills | Status: DC

## 2021-01-24 NOTE — Patient Instructions (Signed)

## 2021-01-24 NOTE — Progress Notes (Signed)
post void residual= 233 Urological Symptom Review  Patient is experiencing the following symptoms: Frequent urination Burning/pain with urination Get up at night to urinate Leakage of urine Stream starts and stops Trouble starting stream Have to strain to urinate   Review of Systems  Gastrointestinal (upper)  : Negative for upper GI symptoms  Gastrointestinal (lower) : Diarrhea  Constitutional : Fatigue  Skin: Negative for skin symptoms  Eyes: Negative for eye symptoms  Ear/Nose/Throat : Negative for Ear/Nose/Throat symptoms  Hematologic/Lymphatic: Easy bruising  Cardiovascular : Chest pain  Respiratory : Cough  Endocrine: Negative for endocrine symptoms  Musculoskeletal: Back pain Joint pain  Neurological: Negative for neurological symptoms  Psychologic: Negative for psychiatric symptoms

## 2021-01-24 NOTE — Progress Notes (Signed)
01/24/2021 9:25 AM   [place]. [birth year] [id number]  Referring provider: Celene Squibb, MD [place],  [place] [address]  Followup prostate cancer and BPH   HPI: Grant [name] is a 76yo here for followup for prostate cancer and BPH. He is on rapaflo 8mg  BID. IPSS 27 QOL 4. PVR 233. Urine stream intermittently strong. Nocturia 3x. PSA increased from 10.7 to 10.9. He was having chest pain and is scheduled for a stress test.    PMH: Past Medical History:  Diagnosis Date   Cancer [facility])    prostate   Cervical stenosis of spine    Claudication (HCC)    Claudication ([place])    Diabetes mellitus without complication ([place])    GERD (gastroesophageal reflux disease)    Glaucoma (increased eye pressure)    Hyperlipidemia    Hypertension    Wears glasses     Surgical History: Past Surgical History:  Procedure Laterality Date   fracture back     HERNIA REPAIR     POSTERIOR CERVICAL FUSION/FORAMINOTOMY N/A 09/10/2017   Procedure: LAMINECTOMY CERVICAL FOUR - CERVICAL FIVE, CERVICAL FIVE - CERVICAL SIX, CERVICAL SIX - CERVICAL SEVEN, POSTERIOR SEGMENTAL INSTRUMENTATION, POSTERIOR LATERAL ARTHRODESIS;  Surgeon: Consuella Lose, MD;  Location: [facility];  Service: Neurosurgery;  Laterality: N/A;  LAMINECTOMY CERVICAL FOUR - CERVICAL FIVE, CERVICAL FIVE - CERVICAL SIX, CERVICAL SIX - CERVICAL SEVEN, POSTERIOR SEGMENTAL    Home Medications:  Allergies as of 01/24/2021       Reactions   Other Other (See Comments)   Augmentin [amoxicillin-pot Clavulanate] Nausea And Vomiting   Hydrocodone Other (See Comments)   " I get goofy and crazy thoughts. "   Oxycontin [oxycodone Hcl] Other (See Comments)   " I couldn't stay still; I was walking around in circles " Pt has tolerated Percocet this medication 08/2017 admssion        Medication List        Accurate as of January 24, 2021  9:25 AM. If you have any questions, ask your nurse or doctor.          aspirin  325 MG tablet Take 650 mg by mouth every 4 (four) hours as needed.   bismuth subsalicylate 716 RC/78LF suspension Commonly known as: PEPTO BISMOL Take 30 mLs by mouth every 6 (six) hours as needed.   cilostazol 50 MG tablet Commonly known as: PLETAL TAKE 1 TABLET BY MOUTH TWICE A DAY   esomeprazole 40 MG capsule Commonly known as: NEXIUM Take 40 mg by mouth daily.   glipiZIDE 5 MG tablet Commonly known as: GLUCOTROL Take 5 mg by mouth 3 (three) times daily with meals.   latanoprost 0.005 % ophthalmic solution Commonly known as: XALATAN Place 1 drop into both eyes at bedtime.   losartan 25 MG tablet Commonly known as: COZAAR Take 1 tablet (25 mg total) by mouth daily.   metFORMIN 500 MG tablet Commonly known as: GLUCOPHAGE Take 500 mg by mouth in the morning, at noon, and at bedtime.   multivitamin with minerals tablet Take 1 tablet by mouth daily.   OneTouch Ultra test strip Generic drug: glucose blood 1 each daily.   rosuvastatin 20 MG tablet Commonly known as: CRESTOR Take 1 tablet (20 mg total) by mouth daily.   silodosin 8 MG Caps capsule Commonly known as: RAPAFLO Take 1 capsule (8 mg total) by mouth 2 (two) times daily.        Allergies:  Allergies  Allergen Reactions   Other Other (See Comments)   Augmentin [Amoxicillin-Pot Clavulanate] Nausea And Vomiting   Hydrocodone Other (See Comments)    " I get goofy and crazy thoughts. "   Oxycontin [Oxycodone Hcl] Other (See Comments)    " I couldn't stay still; I was walking around in circles " Pt has tolerated Percocet this medication 08/2017 admssion    Family History: Family History  Problem Relation Age of Onset   Diabetes Mother    Lupus Mother    Prostate cancer Father     Social History:  reports that he quit smoking about 6 years ago. His smoking use included cigarettes. He has never used smokeless tobacco. He reports current alcohol use. He reports that he does not use drugs.  ROS: All  other review of systems were reviewed and are negative except what is noted above in HPI  Physical Exam: BP 137/66    Pulse 83    Ht 5\' 11"  (1.803 m)    Wt 209 lb (94.8 kg)    BMI 29.15 kg/m   Constitutional:  Alert and oriented, No acute distress. HEENT: [place] AT, moist mucus membranes.  Trachea midline, no masses. Cardiovascular: No clubbing, cyanosis, or edema. Respiratory: Normal respiratory effort, no increased work of breathing. GI: Abdomen is soft, nontender, nondistended, no abdominal masses GU: No CVA tenderness.  Lymph: No cervical or inguinal lymphadenopathy. Skin: No rashes, bruises or suspicious lesions. Neurologic: Grossly intact, no focal deficits, moving all 4 extremities. Psychiatric: Normal mood and affect.  Laboratory Data: Lab Results  Component Value Date   WBC 6.6 01/07/2021   HGB 12.4 (L) 01/07/2021   HCT 38.3 (L) 01/07/2021   MCV 88.5 01/07/2021   PLT 204 01/07/2021    Lab Results  Component Value Date   CREATININE 1.22 01/07/2021    No results found for: PSA  No results found for: TESTOSTERONE  No results found for: HGBA1C  Urinalysis    Component Value Date/Time   APPEARANCEUR Clear 11/23/2019 0944   GLUCOSEU Negative 11/23/2019 0944   BILIRUBINUR Negative 11/23/2019 0944   PROTEINUR Negative 11/23/2019 0944   NITRITE Negative 11/23/2019 0944   LEUKOCYTESUR Negative 11/23/2019 0944    Lab Results  Component Value Date   LABMICR Comment 11/23/2019    Pertinent Imaging:  No results found for this or any previous visit.  No results found for this or any previous visit.  No results found for this or any previous visit.  No results found for this or any previous visit.  No results found for this or any previous visit.  No results found for this or any previous visit.  No results found for this or any previous visit.  No results found for this or any previous visit.   Assessment & Plan:    1. Benign prostatic hyperplasia with  urinary obstruction -Continue rapaflo 8mg  BID. Add finsteride 5mg  daily - Urinalysis, Routine w reflex microscopic - BLADDER SCAN AMB NON-IMAGING  2. Malignant neoplasm of prostate ([place]) -Continue surveillance, RTC 6 months with PSA  3. Incomplete emptying of bladder -Continue rapaflo 8mg  BID and add finasteride 5mg  daily   No follow-ups on file.  Nicolette Bang, MD  [facility] [place]

## 2021-01-29 DIAGNOSIS — I10 Essential (primary) hypertension: Secondary | ICD-10-CM | POA: Diagnosis not present

## 2021-01-29 DIAGNOSIS — N401 Enlarged prostate with lower urinary tract symptoms: Secondary | ICD-10-CM | POA: Diagnosis not present

## 2021-01-29 DIAGNOSIS — Z8546 Personal history of malignant neoplasm of prostate: Secondary | ICD-10-CM | POA: Diagnosis not present

## 2021-01-29 DIAGNOSIS — D638 Anemia in other chronic diseases classified elsewhere: Secondary | ICD-10-CM | POA: Diagnosis not present

## 2021-01-29 DIAGNOSIS — H409 Unspecified glaucoma: Secondary | ICD-10-CM | POA: Diagnosis not present

## 2021-01-29 DIAGNOSIS — M4802 Spinal stenosis, cervical region: Secondary | ICD-10-CM | POA: Diagnosis not present

## 2021-01-29 DIAGNOSIS — E782 Mixed hyperlipidemia: Secondary | ICD-10-CM | POA: Diagnosis not present

## 2021-01-29 DIAGNOSIS — R809 Proteinuria, unspecified: Secondary | ICD-10-CM | POA: Diagnosis not present

## 2021-01-29 DIAGNOSIS — I739 Peripheral vascular disease, unspecified: Secondary | ICD-10-CM | POA: Diagnosis not present

## 2021-01-29 DIAGNOSIS — E1169 Type 2 diabetes mellitus with other specified complication: Secondary | ICD-10-CM | POA: Diagnosis not present

## 2021-02-03 ENCOUNTER — Encounter (HOSPITAL_COMMUNITY): Payer: Self-pay | Admitting: Radiology

## 2021-02-03 ENCOUNTER — Emergency Department (HOSPITAL_COMMUNITY): Payer: Medicare HMO

## 2021-02-03 ENCOUNTER — Other Ambulatory Visit: Payer: Self-pay

## 2021-02-03 ENCOUNTER — Emergency Department (HOSPITAL_COMMUNITY)
Admission: EM | Admit: 2021-02-03 | Discharge: 2021-02-03 | Disposition: A | Discharge status: Home / Self Care | Payer: Medicare HMO | Attending: Emergency Medicine | Admitting: Emergency Medicine

## 2021-02-03 ENCOUNTER — Encounter: Payer: Self-pay | Admitting: Emergency Medicine

## 2021-02-03 ENCOUNTER — Ambulatory Visit
Admission: EM | Admit: 2021-02-03 | Discharge: 2021-02-03 | Disposition: A | Discharge status: Home / Self Care | Payer: Medicare HMO

## 2021-02-03 DIAGNOSIS — I10 Essential (primary) hypertension: Secondary | ICD-10-CM | POA: Diagnosis not present

## 2021-02-03 DIAGNOSIS — Z7984 Long term (current) use of oral hypoglycemic drugs: Secondary | ICD-10-CM | POA: Diagnosis not present

## 2021-02-03 DIAGNOSIS — R079 Chest pain, unspecified: Secondary | ICD-10-CM | POA: Diagnosis not present

## 2021-02-03 DIAGNOSIS — Z7982 Long term (current) use of aspirin: Secondary | ICD-10-CM | POA: Diagnosis not present

## 2021-02-03 DIAGNOSIS — E119 Type 2 diabetes mellitus without complications: Secondary | ICD-10-CM | POA: Insufficient documentation

## 2021-02-03 DIAGNOSIS — I700 Atherosclerosis of aorta: Secondary | ICD-10-CM | POA: Diagnosis not present

## 2021-02-03 DIAGNOSIS — R519 Headache, unspecified: Secondary | ICD-10-CM | POA: Diagnosis not present

## 2021-02-03 DIAGNOSIS — Z79899 Other long term (current) drug therapy: Secondary | ICD-10-CM | POA: Diagnosis not present

## 2021-02-03 LAB — CBC
HCT: 38.1 % — ABNORMAL LOW (ref 39.0–52.0)
Hemoglobin: 12 g/dL — ABNORMAL LOW (ref 13.0–17.0)
MCH: 27.4 pg (ref 26.0–34.0)
MCHC: 31.5 g/dL (ref 30.0–36.0)
MCV: 87 fL (ref 80.0–100.0)
Platelets: 195 10*3/uL (ref 150–400)
RBC: 4.38 MIL/uL (ref 4.22–5.81)
RDW: 12.9 % (ref 11.5–15.5)
WBC: 5.8 10*3/uL (ref 4.0–10.5)
nRBC: 0 % (ref 0.0–0.2)

## 2021-02-03 LAB — BASIC METABOLIC PANEL
Anion gap: 8 (ref 5–15)
BUN: 26 mg/dL — ABNORMAL HIGH (ref 8–23)
CO2: 28 mmol/L (ref 22–32)
Calcium: 8.8 mg/dL — ABNORMAL LOW (ref 8.9–10.3)
Chloride: 104 mmol/L (ref 98–111)
Creatinine, Ser: 1.23 mg/dL (ref 0.61–1.24)
GFR, Estimated: >60 mL/min (ref >60)
Glucose, Bld: 103 mg/dL — ABNORMAL HIGH (ref 70–99)
Potassium: 3.9 mmol/L (ref 3.5–5.1)
Sodium: 140 mmol/L (ref 135–145)

## 2021-02-03 LAB — TROPONIN I (HIGH SENSITIVITY)
Troponin I (High Sensitivity): 6 ng/L (ref <18)
Troponin I (High Sensitivity): 6 ng/L (ref <18)

## 2021-02-03 MED ORDER — NITROGLYCERIN 2 % TD OINT
1.0000 [in_us] | TOPICAL_OINTMENT | Freq: Once | TRANSDERMAL | Status: AC
  Administered 2021-02-03: 1 [in_us] via TOPICAL
  Filled 2021-02-03: qty 1

## 2021-02-03 NOTE — ED Triage Notes (Addendum)
Pt reports had chest pain x2 weeks ago, pt reports has been monitoring blood pressure at home ever since and reports has an appointment with a cardiologist next week for stress test. Pt reports blood pressure readings at home "was over 200" today.

## 2021-02-03 NOTE — ED Notes (Addendum)
PA assessed and discussed with pt option of calling ambulance or going POV to [facility] ED. Pt reported had family in waiting room that would transport pt to [facility] ED.

## 2021-02-03 NOTE — ED Provider Notes (Signed)
°  [place]   Patient was evaluated through the triage process.  Has had intermittent episodes of chest pain over the past 2 weeks.  Last episode was 1 week ago.  Has had persistently elevated high blood pressures.  He has continue to monitor his blood pressure and has had readings today well over 200s.  Has a history of hypertension, diabetes.  No history of MI to his knowledge.  Given his risk factors, severely elevated blood pressure recommended evaluation through the emergency room for rule out of a heart event.  He has no active chest pain now, advised that we start his visit and call an ambulance but patient declined.  We will go to the hospital by personal vehicle.  He has family members that contract for safety and will take him there now.   Jaynee Eagles, [place] 02/03/21 1951

## 2021-02-03 NOTE — Progress Notes (Signed)
External labs 01/22/2021: A1c 7.1% Total cholesterol 127, triglycerides 214, HDL 38, LDL 54 BUN 22, creatinine 1.05, GFR >60, sodium 143, potassium 4.6 Hgb 13, HCT 39.0, MCV 84, platelet 209

## 2021-02-03 NOTE — Discharge Instructions (Signed)
Keep your appointment with cardiology for Tuesday.  Follow-up with your primary care doctor prior to that if possible.  When you do follow-up, discuss blood pressure medications.  Continue to take your losartan.  Please return to the emergency department if you experience any worsening symptoms.

## 2021-02-03 NOTE — ED Provider Notes (Signed)
[facility] EMERGENCY DEPARTMENT Provider Note   CSN: [id number] Arrival date & time: 02/03/21  1957     History  Chief Complaint  Patient presents with   Chest Pain   Hypertension    [facility] Grant [name]. is a 76 y.o. male.   Hypertension Associated symptoms include headaches. Pertinent negatives include no chest pain, no abdominal pain and no shortness of breath.  Patient presents for elevated blood pressure.  He takes Cozaar daily.  He reports that his blood pressures are typically in the range of 140s over 80s.  He was checking his blood pressure at home and found it to be elevated greater than 200 SBP.  He went to urgent care where they advised him to come to the ED.  Recent symptoms include a intermittent frontal headache and fatigue.  He denies any chest pain or shortness of breath. Medical history is notable for HLD, HTN, DM, and claudication.  Per chart review, home medications include ASA, losartan.    Home Medications Prior to Admission medications   Medication Sig Start Date End Date Taking? Authorizing Provider  amLODipine (NORVASC) 5 MG tablet Take 1 tablet (5 mg total) by mouth daily. 02/04/21 06/04/21  Cantwell, Celeste C, PA-C  aspirin 325 MG tablet Take 650 mg by mouth every 4 (four) hours as needed.    [provider]  bismuth subsalicylate (PEPTO BISMOL) 262 MG/15ML suspension Take 30 mLs by mouth every 6 (six) hours as needed.    [provider]  cilostazol (PLETAL) 50 MG tablet TAKE 1 TABLET BY MOUTH TWICE A DAY 11/28/18   Patwardhan, Manish J, MD  esomeprazole (NEXIUM) 40 MG capsule Take 40 mg by mouth daily. 04/14/19   [provider]  finasteride (PROSCAR) 5 MG tablet Take 1 tablet (5 mg total) by mouth daily. 01/24/21   McKenzie, Candee Furbish, MD  glipiZIDE (GLUCOTROL) 5 MG tablet Take 5 mg by mouth 3 (three) times daily with meals. 03/09/19   [provider]  latanoprost (XALATAN) 0.005 % ophthalmic solution Place 1 drop into  both eyes at bedtime. 01/07/21   [provider]  losartan (COZAAR) 25 MG tablet Take 1 tablet (25 mg total) by mouth daily. 01/15/21 01/10/22  Cantwell, Celeste C, PA-C  metFORMIN (GLUCOPHAGE) 500 MG tablet Take 500 mg by mouth in the morning, at noon, and at bedtime. 02/24/18   [provider]  Multiple Vitamins-Minerals (MULTIVITAMIN WITH MINERALS) tablet Take 1 tablet by mouth daily.    [provider]  [facility] ULTRA test strip 1 each daily. 09/12/19   [provider]  rosuvastatin (CRESTOR) 20 MG tablet Take 1 tablet (20 mg total) by mouth daily. 05/09/18   Patwardhan, Reynold Bowen, MD  silodosin (RAPAFLO) 8 MG CAPS capsule Take 1 capsule (8 mg total) by mouth 2 (two) times daily. 01/24/21   McKenzie, Candee Furbish, MD      Allergies    Other, Augmentin [amoxicillin-pot clavulanate], Hydrocodone, and Oxycontin [oxycodone hcl]    Review of Systems   Review of Systems  Constitutional:  Positive for fatigue. Negative for activity change, appetite change, chills and fever.  HENT:  Negative for ear pain and sore throat.   Eyes:  Negative for photophobia, pain and visual disturbance.  Respiratory:  Negative for cough, chest tightness, shortness of breath and wheezing.   Cardiovascular:  Negative for chest pain and palpitations.  Gastrointestinal:  Negative for abdominal pain, nausea and vomiting.  Genitourinary:  Negative for dysuria, flank pain and  hematuria.  Musculoskeletal:  Negative for arthralgias, back pain and neck pain.  Skin:  Negative for color change and rash.  Neurological:  Positive for headaches. Negative for dizziness, seizures, syncope, facial asymmetry, speech difficulty, weakness and numbness.  Hematological:  Does not bruise/bleed easily.  Psychiatric/Behavioral:  Negative for confusion and decreased concentration.   All other systems reviewed and are negative.  Physical Exam Updated Vital Signs BP (!) 149/79    Pulse 72    Temp 97.9 F (36.6  C) (Oral)    Resp 15    Ht 5\' 11"  (1.803 m)    Wt 94 kg    SpO2 97%    BMI 28.90 kg/m  Physical Exam Vitals and nursing note reviewed.  Constitutional:      General: He is not in acute distress.    Appearance: He is well-developed and normal weight. He is not ill-appearing, toxic-appearing or diaphoretic.  HENT:     Head: Normocephalic and atraumatic.  Eyes:     General: No visual field deficit.    Extraocular Movements: Extraocular movements intact.     Conjunctiva/sclera: Conjunctivae normal.  Neck:     Vascular: No JVD.  Cardiovascular:     Rate and Rhythm: Normal rate and regular rhythm.     Heart sounds: No murmur heard. Pulmonary:     Effort: Pulmonary effort is normal. No respiratory distress.     Breath sounds: Normal breath sounds. No decreased breath sounds, wheezing or rales.  Chest:     Chest wall: No tenderness or edema.  Abdominal:     Palpations: Abdomen is soft.     Tenderness: There is no abdominal tenderness.  Musculoskeletal:        General: No swelling.     Cervical back: Neck supple.     Right lower leg: No edema.     Left lower leg: No edema.  Skin:    General: Skin is warm and dry.     Capillary Refill: Capillary refill takes less than 2 seconds.     Coloration: Skin is not cyanotic or pale.  Neurological:     General: No focal deficit present.     Mental Status: He is alert and oriented to person, place, and time.     Cranial Nerves: Cranial nerves 2-12 are intact. No cranial nerve deficit, dysarthria or facial asymmetry.     Sensory: Sensation is intact. No sensory deficit.     Motor: Motor function is intact. No weakness, abnormal muscle tone or pronator drift.     Coordination: Coordination is intact. Romberg sign negative.  Psychiatric:        Mood and Affect: Mood normal.        Behavior: Behavior normal.    ED Results / Procedures / Treatments   Labs (all labs ordered are listed, but only abnormal results are displayed) Labs Reviewed   BASIC METABOLIC PANEL - Abnormal; Notable for the following components:      Result Value   Glucose, Bld 103 (*)    BUN 26 (*)    Calcium 8.8 (*)    All other components within normal limits  CBC - Abnormal; Notable for the following components:   Hemoglobin 12.0 (*)    HCT 38.1 (*)    All other components within normal limits  TROPONIN I (HIGH SENSITIVITY)  TROPONIN I (HIGH SENSITIVITY)    EKG EKG Interpretation  Date/Time:  Monday February 03 2021 20:11:56 EST Ventricular Rate:  77 PR Interval:  154 QRS Duration: 100 QT Interval:  398 QTC Calculation: 450 R Axis:   103 Text Interpretation: Normal sinus rhythm Rightward axis Borderline ECG No acute changes No significant change since last tracing Confirmed by Varney Biles [id number]) on 02/04/2021 2:00:30 PM  Radiology DG Chest 2 View  Result Date: 02/03/2021 CLINICAL DATA:  Chest pain and hypertension x2 weeks. EXAM: CHEST - 2 VIEW COMPARISON:  January 07, 2021 FINDINGS: Mild, diffuse, chronic appearing increased interstitial lung markings are seen. Mild atelectatic changes are noted within the left lung base. There is no evidence of a pleural effusion or pneumothorax. The heart size and mediastinal contours are within normal limits. There is mild to moderate severity calcification of the aortic arch and tortuosity of the descending thoracic aorta. Bilateral radiopaque pedicle screws are seen throughout the visualized portion of the cervical spine. The visualized skeletal structures are otherwise unremarkable. IMPRESSION: Chronic appearing increased interstitial lung markings with mild left basilar atelectatic changes. Electronically Signed   By: Virgina Norfolk M.D.   On: 02/03/2021 20:44   CT Head Wo Contrast  Result Date: 02/03/2021 CLINICAL DATA:  Elevated blood pressure and posterior headache. EXAM: CT HEAD WITHOUT CONTRAST TECHNIQUE: Contiguous axial images were obtained from the base of the skull through the vertex without  intravenous contrast. RADIATION DOSE REDUCTION: This exam was performed according to the departmental dose-optimization program which includes automated exposure control, adjustment of the mA and/or kV according to patient size and/or use of iterative reconstruction technique. COMPARISON:  None. FINDINGS: Brain: There is mild cerebral atrophy with widening of the extra-axial spaces and ventricular dilatation. There are areas of decreased attenuation within the white matter tracts of the supratentorial brain, consistent with microvascular disease changes. Vascular: No hyperdense vessel or unexpected calcification. Skull: Normal. Negative for fracture or focal lesion. Sinuses/Orbits: No acute finding. Other: None. IMPRESSION: No acute intracranial abnormality. Electronically Signed   By: Virgina Norfolk M.D.   On: 02/03/2021 22:52    Procedures Procedures    Medications Ordered in ED Medications  nitroGLYCERIN (NITROGLYN) 2 % ointment 1 inch (1 inch Topical Given 02/03/21 2317)    ED Course/ Medical Decision Making/ A&P                           Medical Decision Making Amount and/or Complexity of Data Reviewed Labs: ordered. Radiology: ordered.  Risk Prescription drug management.   This patient presents to the ED for concern of hypertension, this involves an extensive number of treatment options, and is a complaint that carries with it a high risk of complications and morbidity.  The differential diagnosis includes hypertensive urgency, hypertensive emergency   Co morbidities that complicate the patient evaluation  Hypertension, aortic valve regurgitation, cervical spine stenosis, BPH, claudication   Additional history obtained:  Additional history obtained from patient's family members External records from outside source obtained and reviewed including EMR   Lab Tests:  I Ordered, and personally interpreted labs.  The pertinent results include: No evidence of endorgan  damage   Imaging Studies ordered:  I ordered imaging studies including chest area, CT head I independently visualized and interpreted imaging which showed no acute findings I agree with the radiologist interpretation   Cardiac Monitoring:  The patient was maintained on a cardiac monitor.  I personally viewed and interpreted the cardiac monitored which showed an underlying rhythm of: Sinus rhythm   Medicines ordered and prescription drug management:  I ordered medication including nitroglycerin ointment  for hypertension Reevaluation of the patient after these medicines showed that the patient improved I have reviewed the patients home medicines and have made adjustments as needed   Problem List / ED Course:  Very pleasant 76 year old male with history of hypertension, managed with Cozaar daily, presents for elevated blood pressures.  Blood pressures measured at home were greater than 200 SBP.  He went to urgent care and was directed to come to the ED.  On arrival in the ED, his blood pressures are elevated in the range of 190s over 80s.  This improved at time of initial assessment.  Patient has had a mild intermittent headache over the past 2 days but denies any other recent symptoms.  He has no focal neurologic deficits on exam.  Lab work is reassuring and does not show any evidence of endorgan damage.  Given his headache and hypertension, CT scan of head was ordered which was also reassuring.  He was given nitroglycerin ointment in the ED and had improvement of blood pressure to 149/79.  He was offered a low-dose Imdur to add to his current blood pressure medication.  Patient declined this.  He states that he does have a cardiology follow-up appointment on Tuesday and would likely be able to be seen by his primary care doctor prior to that.  He states that he will discuss blood pressure medications at those outpatient visits.  Patient is stable for discharge at this  time.   Reevaluation:  After the interventions noted above, I reevaluated the patient and found that they have :improved   Social Determinants of Health:  Patient has tried family support and good access to outpatient medical care.   Dispostion:  After consideration of the diagnostic results and the patients response to treatment, I feel that the patent would benefit from discharge with PCP and cardiology (already scheduled) follow-up.          Final Clinical Impression(s) / ED Diagnoses Final diagnoses:  Hypertension, unspecified type    Rx / DC Orders ED Discharge Orders     None         Godfrey Pick, MD 02/05/21 0451

## 2021-02-03 NOTE — ED Triage Notes (Signed)
Came POV from UC with cc of HTN- 202/80 Was seen in December for CP and is supposed to have a stress test next week  Only complaint is a slight headache.

## 2021-02-04 ENCOUNTER — Telehealth: Payer: Self-pay

## 2021-02-04 DIAGNOSIS — I10 Essential (primary) hypertension: Secondary | ICD-10-CM

## 2021-02-04 MED ORDER — AMLODIPINE BESYLATE 5 MG PO TABS: 5.0000 mg | ORAL_TABLET | Freq: Every day | ORAL | 3 refills | Status: AC

## 2021-02-06 NOTE — Telephone Encounter (Signed)
Called pt no answer, left a vm

## 2021-02-07 NOTE — Telephone Encounter (Signed)
Called and spoke to pt, pt voiced understanding.

## 2021-02-10 ENCOUNTER — Other Ambulatory Visit: Payer: Medicare HMO

## 2021-02-12 ENCOUNTER — Ambulatory Visit: Payer: Medicare HMO

## 2021-02-12 ENCOUNTER — Other Ambulatory Visit: Payer: Self-pay

## 2021-02-12 DIAGNOSIS — I209 Angina pectoris, unspecified: Secondary | ICD-10-CM

## 2021-02-12 DIAGNOSIS — R0989 Other specified symptoms and signs involving the circulatory and respiratory systems: Secondary | ICD-10-CM

## 2021-02-12 DIAGNOSIS — I6523 Occlusion and stenosis of bilateral carotid arteries: Secondary | ICD-10-CM | POA: Diagnosis not present

## 2021-02-12 DIAGNOSIS — I351 Nonrheumatic aortic (valve) insufficiency: Secondary | ICD-10-CM

## 2021-02-14 ENCOUNTER — Other Ambulatory Visit: Payer: Self-pay

## 2021-02-14 MED ORDER — CHLORTHALIDONE 25 MG PO TABS: 25.0000 mg | ORAL_TABLET | Freq: Every day | ORAL | 3 refills | Status: DC

## 2021-02-14 NOTE — Telephone Encounter (Signed)
Patient called and said his BP has been high 200/100 hr 76 its been like this for a while now. Pt has his testing 02/12/21 also

## 2021-02-18 DIAGNOSIS — F419 Anxiety disorder, unspecified: Secondary | ICD-10-CM | POA: Diagnosis not present

## 2021-02-18 DIAGNOSIS — R69 Illness, unspecified: Secondary | ICD-10-CM | POA: Diagnosis not present

## 2021-02-18 DIAGNOSIS — K219 Gastro-esophageal reflux disease without esophagitis: Secondary | ICD-10-CM | POA: Diagnosis not present

## 2021-02-24 DIAGNOSIS — H18593 Other hereditary corneal dystrophies, bilateral: Secondary | ICD-10-CM | POA: Diagnosis not present

## 2021-02-24 DIAGNOSIS — H40023 Open angle with borderline findings, high risk, bilateral: Secondary | ICD-10-CM | POA: Diagnosis not present

## 2021-02-24 DIAGNOSIS — H5203 Hypermetropia, bilateral: Secondary | ICD-10-CM | POA: Diagnosis not present

## 2021-02-24 DIAGNOSIS — Z961 Presence of intraocular lens: Secondary | ICD-10-CM | POA: Diagnosis not present

## 2021-02-24 DIAGNOSIS — D3131 Benign neoplasm of right choroid: Secondary | ICD-10-CM | POA: Diagnosis not present

## 2021-02-24 DIAGNOSIS — E119 Type 2 diabetes mellitus without complications: Secondary | ICD-10-CM | POA: Diagnosis not present

## 2021-02-26 ENCOUNTER — Other Ambulatory Visit: Payer: Self-pay

## 2021-02-26 ENCOUNTER — Encounter: Payer: Self-pay | Admitting: Student

## 2021-02-26 ENCOUNTER — Ambulatory Visit: Payer: Medicare HMO | Admitting: Student

## 2021-02-26 VITALS — BP 130/79 | HR 90 | Temp 98.0°F | Ht 71.0 in | Wt 211.0 lb

## 2021-02-26 DIAGNOSIS — I10 Essential (primary) hypertension: Secondary | ICD-10-CM

## 2021-02-26 DIAGNOSIS — I6523 Occlusion and stenosis of bilateral carotid arteries: Secondary | ICD-10-CM

## 2021-02-26 DIAGNOSIS — I209 Angina pectoris, unspecified: Secondary | ICD-10-CM

## 2021-02-26 NOTE — Progress Notes (Signed)
Patient is here for follow up visit.  Subjective:   Grant Most., male    DOB: [birth year], 76 y.o.   MRN: [id number]   Chief Complaint  Patient presents with   Hypertension   Chest Pain   Follow-up    HPI  76 y.o. Caucasian male with hyperlipidemia, hypertension, diabetes, claudication with mildly reduced b/l ABI.  Patient presents for 6-week follow-up.  At last office visit ordered stress test and echocardiogram.  Echocardiogram revealed normal LVEF with mild valvular disease.  Stress test overall low risk.  Carotid artery duplex reveals mild bilateral stenosis, will repeat surveillance in 1 year.  At last office visit due to uncontrolled hypertension added losartan 25 mg daily, however he subsequently ended up in the ED with systolic blood pressure as high as 200 mmHg.  Work-up in the ED was unremarkable and he was discharged home.  He called our office following this and I added chlorthalidone, however repeat BMP has not been done.   Patient reports he is feeling well overall since evaluation in the emergency department without specific complaints today.  However he has been monitoring his blood pressure at home and reports readings that remain uncontrolled despite addition of chlorthalidone, which she is tolerating without issue.  However upon further questioning patient is not eating proper technique to check his blood pressure and his home monitor has not been calibrated.  Notably patient is planning to see PCP in the next couple of weeks due to intermittent headaches for which she is taking aspirin 325 mg as needed because Tylenol does not relieve his symptoms.  Patient's description of his headache appears consistent with tension headaches.  Current Outpatient Medications on File Prior to Visit  Medication Sig Dispense Refill   amLODipine (NORVASC) 5 MG tablet Take 1 tablet (5 mg total) by mouth daily. 30 tablet 3   aspirin 325 MG tablet Take 650 mg by mouth every 4  (four) hours as needed.     bismuth subsalicylate (PEPTO BISMOL) 262 MG/15ML suspension Take 30 mLs by mouth every 6 (six) hours as needed.     chlorthalidone (HYGROTON) 25 MG tablet Take 1 tablet (25 mg total) by mouth daily. 30 tablet 3   cilostazol (PLETAL) 50 MG tablet TAKE 1 TABLET BY MOUTH TWICE A DAY 180 tablet 1   esomeprazole (NEXIUM) 40 MG capsule Take 40 mg by mouth daily.     finasteride (PROSCAR) 5 MG tablet Take 1 tablet (5 mg total) by mouth daily. 90 tablet 3   glipiZIDE (GLUCOTROL) 5 MG tablet Take 5 mg by mouth 3 (three) times daily with meals.     latanoprost (XALATAN) 0.005 % ophthalmic solution Place 1 drop into both eyes at bedtime.     losartan (COZAAR) 25 MG tablet Take 1 tablet (25 mg total) by mouth daily. 90 tablet 3   metFORMIN (GLUCOPHAGE) 500 MG tablet Take 500 mg by mouth in the morning, at noon, and at bedtime.     Multiple Vitamins-Minerals (MULTIVITAMIN WITH MINERALS) tablet Take 1 tablet by mouth daily.     ONETOUCH ULTRA test strip 1 each daily.     rosuvastatin (CRESTOR) 20 MG tablet Take 1 tablet (20 mg total) by mouth daily. 90 tablet 3   silodosin (RAPAFLO) 8 MG CAPS capsule Take 1 capsule (8 mg total) by mouth 2 (two) times daily. 60 capsule 11   No current facility-administered medications on file prior to visit.    Cardiovascular studies: PCV ECHOCARDIOGRAM  COMPLETE 76/28/3151 Normal LV systolic function with visual EF 60-65%. Left ventricle cavity is normal in size. Moderate left ventricular hypertrophy. Normal global wall motion. Normal diastolic filling pattern, normal LAP. Mild (Grade I) aortic regurgitation. Mild (Grade I) mitral regurgitation. Mild pulmonic regurgitation. Compared to study 03/14/2019 AR remains stable otherwise no significant change.  PCV MYOCARDIAL PERFUSION WITH LEXISCAN 02/12/2021   Left ventricular function is normal. End diastolic cavity size is normal. Nondiagnostic ECG stress. The heart rate response was consistent  with Lexiscan. Review of the raw data in cine format shows mild motion artifact. Mild decreased counts in the septum and inferior wall due to motion artifact. Minimal ischemic cannot be excluded in this region. Otherwise normal perfusion. Gated SPECT imaging of the left ventricle was normal. All segments of left ventricle demonstrated normal wall motion and thickening. Stress LV EF is hyperdynamic 77%. Low risk. No previous exam available for comparison.  Carotid artery duplex 02/12/2021:  Duplex suggests stenosis in the right internal carotid artery (1-15%).  Duplex suggests stenosis in the right common carotid artery (<50%).  Duplex suggests stenosis in the left internal carotid artery (1-15%).  Duplex suggests stenosis in the left common carotid artery (<50%).  Bilateral heterogenous plaque noted.  Antegrade right vertebral artery flow. Antegrade left vertebral artery flow.  Follow up in one year is appropriate if clinically indicated.  EKG 01/07/2021: Sinus rhythm at a rate of 85 bpm.  Normal axis.  No evidence of ischemia or underlying injury pattern.  Compared EKG 03/15/2019, no significant change  EKG 03/15/2019: Sinus rhythm 82 bpm. Normal EKG.  Echocardiogram 03/14/2019: Left ventricle cavity is normal in size. Moderate concentric hypertrophy of the left ventricle. Normal LV systolic function with EF 56%. Normal global wall motion. Doppler evidence of grade I (impaired) diastolic dysfunction, normal LAP.  Trileaflet aortic valve. Trace aortic stenosis. Mild (Grade I) aortic regurgitation. Mild (Grade I) mitral regurgitation. IVC is dilated with respiratory variation. Estimated RA pressure 8 mmHg. No significant change compared to previous study on 04/29/2017.   Recent labs: CMP Latest Ref Rng & Units 02/03/2021 01/07/2021 09/11/2017  Glucose 70 - 99 mg/dL 103(H) 143(H) 367(H)  BUN 8 - 23 mg/dL 26(H) 25(H) 23  Creatinine 0.61 - 1.24 mg/dL 1.23 1.22 1.08  Sodium 135 - 145 mmol/L  140 140 133(L)  Potassium 3.5 - 5.1 mmol/L 3.9 4.1 4.4  Chloride 98 - 111 mmol/L 104 103 96(L)  CO2 22 - 32 mmol/L 28 28 25   Calcium 8.9 - 10.3 mg/dL 8.8(L) 9.0 8.6(L)   CBC Latest Ref Rng & Units 02/03/2021 01/07/2021 09/11/2017  WBC 4.0 - 10.5 K/uL 5.8 6.6 10.7(H)  Hemoglobin 13.0 - 17.0 g/dL 12.0(L) 12.4(L) 12.3(L)  Hematocrit 39.0 - 52.0 % 38.1(L) 38.3(L) 37.8(L)  Platelets 150 - 400 K/uL 195 204 158   Lipid Panel  No results found for: CHOL, TRIG, HDL, CHOLHDL, VLDL, LDLCALC, LDLDIRECT HEMOGLOBIN A1C No results found for: HGBA1C, MPG TSH No results for input(s): TSH in the last 8760 hours.  External Labs: 02/09/2019: Glucose 98. BUN/Cr 19/0.9. Chol 101, TG 126, HDL 40 HbA1C 6.6%  07/26/2019: LDL 34   Review of Systems  Cardiovascular:  Positive for dyspnea on exertion (stable). Negative for chest pain, leg swelling, palpitations and syncope.  Musculoskeletal:  Positive for falls.      Objective:    Vitals:   02/26/21 1034 02/26/21 1036  BP: (!) 147/74 130/79  Pulse: 89 90  Temp: 98 F (36.7 C)   SpO2: 93% 95%  Physical Exam Vitals and nursing note reviewed.  Constitutional:      General: He is not in acute distress.    Appearance: He is well-developed.  Neck:     Vascular: No JVD.  Cardiovascular:     Rate and Rhythm: Normal rate and regular rhythm.     Pulses:          Carotid pulses are  on the right side with bruit.      Dorsalis pedis pulses are 0 on the right side and 0 on the left side.       Posterior tibial pulses are 1+ on the right side and 1+ on the left side.     Heart sounds: Murmur (I/VIearly diastolic murmur LLSB) heard.  Pulmonary:     Effort: Pulmonary effort is normal.     Breath sounds: Normal breath sounds. No wheezing or rales.  Musculoskeletal:     Right lower leg: No edema.     Left lower leg: No edema.  Neurological:     General: No focal deficit present.     Mental Status: He is alert.     Cranial Nerves: No cranial  nerve deficit.        Assessment & Recommendations:   76 y.o. Caucasian male with hyperlipidemia, hypertension, diabetes, claudication with mildly reduced b/l ABI.  Angina pectoris: Patient has had no recurrence of chest pain.  Reviewed and discussed results of stress test which was overall low risk.  We will therefore hold off on further evaluation at this time.  However if patient has recurrence of chest pain he agrees to notify our office.  Dyspnea on exertion: Reviewed and discussed results of echocardiogram and stress test, details above.  Echocardiogram is stable and reveals normal LVEF and mild valvular disease.  Stress test overall low risk. Advised patient to slowly increase physical activity and continue to follow with PCP for evaluation of noncardiac causes of dyspnea.  Suspect deconditioning is contributing to current symptoms.  Nonrheumatic aortic valve insufficiency Repeat echocardiogram reveals aortic valve regurgitation is stable and mild.  Essential hypertension Blood pressure is well controlled in the office.  He reports home readings have been significantly elevated.  However on 02/03/2021 when home blood pressure reading was 876 mmHg systolic his blood pressure was initially elevated 190s/80s at presentation to the ED but quickly improved during evaluation to 149/79 mmHg. Patient is tolerating current medications without issue.  We will obtain repeat BMP since initiation of chlorthalidone. Continue amlodipine, chlorthalidone, losartan Discussed at length with patient regarding proper technique for checking blood pressure at home, he verbalized understanding.  Also advised patient to take blood pressure monitor to PCPs office or our office to calibrate it.  Again advised patient regarding low-sodium diet. Not make changes to medications at this time, however patient does have follow-up with PCP and will defer blood pressure check to primary care at this  time.  Claudication ([place]) Symptoms well controlled. Continue cilostazol.  Continue statin, metformin.  Bilateral carotid artery stenosis: Reviewed and discussed results of carotid artery duplex, details above. Patient does have mild carotid artery stenosis, will repeat surveillance scan in 1 year. Continue statin therapy Discussed with patient initiation of aspirin 81 mg daily, however he was hesitant given that he has been having intermittent headaches which only aspirin seems to improve.  He is scheduled to see PCP for follow-up and management of these headaches.  Therefore shared decision in the setting of mild carotid disease was to hold off  on making changes to aspirin at this time.  Will reevaluate at next office visit.  I personally reviewed records including labs, provider notes, and radiology results.  Follow-up in 6 months, sooner if needed.    Alethia Berthold, PA-C 02/26/2021, 11:51 AM Office: [phone]

## 2021-02-28 DIAGNOSIS — E1169 Type 2 diabetes mellitus with other specified complication: Secondary | ICD-10-CM | POA: Diagnosis not present

## 2021-02-28 DIAGNOSIS — I10 Essential (primary) hypertension: Secondary | ICD-10-CM | POA: Diagnosis not present

## 2021-02-28 DIAGNOSIS — R519 Headache, unspecified: Secondary | ICD-10-CM | POA: Diagnosis not present

## 2021-03-10 DIAGNOSIS — M79672 Pain in left foot: Secondary | ICD-10-CM | POA: Diagnosis not present

## 2021-03-10 DIAGNOSIS — I739 Peripheral vascular disease, unspecified: Secondary | ICD-10-CM | POA: Diagnosis not present

## 2021-03-10 DIAGNOSIS — M79671 Pain in right foot: Secondary | ICD-10-CM | POA: Diagnosis not present

## 2021-03-10 DIAGNOSIS — E1140 Type 2 diabetes mellitus with diabetic neuropathy, unspecified: Secondary | ICD-10-CM | POA: Diagnosis not present

## 2021-05-26 ENCOUNTER — Other Ambulatory Visit: Payer: Self-pay | Admitting: Student

## 2021-05-26 DIAGNOSIS — M79672 Pain in left foot: Secondary | ICD-10-CM | POA: Diagnosis not present

## 2021-05-26 DIAGNOSIS — M79671 Pain in right foot: Secondary | ICD-10-CM | POA: Diagnosis not present

## 2021-05-26 DIAGNOSIS — E1140 Type 2 diabetes mellitus with diabetic neuropathy, unspecified: Secondary | ICD-10-CM | POA: Diagnosis not present

## 2021-05-26 DIAGNOSIS — I739 Peripheral vascular disease, unspecified: Secondary | ICD-10-CM | POA: Diagnosis not present

## 2021-05-29 DIAGNOSIS — E1169 Type 2 diabetes mellitus with other specified complication: Secondary | ICD-10-CM | POA: Diagnosis not present

## 2021-05-29 DIAGNOSIS — E782 Mixed hyperlipidemia: Secondary | ICD-10-CM | POA: Diagnosis not present

## 2021-06-06 DIAGNOSIS — K429 Umbilical hernia without obstruction or gangrene: Secondary | ICD-10-CM | POA: Diagnosis not present

## 2021-06-06 DIAGNOSIS — D638 Anemia in other chronic diseases classified elsewhere: Secondary | ICD-10-CM | POA: Diagnosis not present

## 2021-06-06 DIAGNOSIS — R809 Proteinuria, unspecified: Secondary | ICD-10-CM | POA: Diagnosis not present

## 2021-06-06 DIAGNOSIS — M4802 Spinal stenosis, cervical region: Secondary | ICD-10-CM | POA: Diagnosis not present

## 2021-06-06 DIAGNOSIS — I739 Peripheral vascular disease, unspecified: Secondary | ICD-10-CM | POA: Diagnosis not present

## 2021-06-06 DIAGNOSIS — E1169 Type 2 diabetes mellitus with other specified complication: Secondary | ICD-10-CM | POA: Diagnosis not present

## 2021-06-06 DIAGNOSIS — N401 Enlarged prostate with lower urinary tract symptoms: Secondary | ICD-10-CM | POA: Diagnosis not present

## 2021-06-06 DIAGNOSIS — E782 Mixed hyperlipidemia: Secondary | ICD-10-CM | POA: Diagnosis not present

## 2021-06-06 DIAGNOSIS — K219 Gastro-esophageal reflux disease without esophagitis: Secondary | ICD-10-CM | POA: Diagnosis not present

## 2021-06-06 DIAGNOSIS — Z8546 Personal history of malignant neoplasm of prostate: Secondary | ICD-10-CM | POA: Diagnosis not present

## 2021-06-06 DIAGNOSIS — I10 Essential (primary) hypertension: Secondary | ICD-10-CM | POA: Diagnosis not present

## 2021-06-06 DIAGNOSIS — H409 Unspecified glaucoma: Secondary | ICD-10-CM | POA: Diagnosis not present

## 2021-06-06 NOTE — Progress Notes (Signed)
External labs 05/30/2021: A1c 8.5% Triglycerides 243, HDL 35, LDL 48 BUN 27, creatinine 1.27, GFR 59, sodium 139, potassium 3.9, Hgb 13.1, HCT 39.8, MCV 88, platelet 214

## 2021-07-07 DIAGNOSIS — L738 Other specified follicular disorders: Secondary | ICD-10-CM | POA: Diagnosis not present

## 2021-07-07 DIAGNOSIS — L570 Actinic keratosis: Secondary | ICD-10-CM | POA: Diagnosis not present

## 2021-07-07 DIAGNOSIS — X32XXXA Exposure to sunlight, initial encounter: Secondary | ICD-10-CM | POA: Diagnosis not present

## 2021-07-07 DIAGNOSIS — D485 Neoplasm of uncertain behavior of skin: Secondary | ICD-10-CM | POA: Diagnosis not present

## 2021-07-18 DIAGNOSIS — D638 Anemia in other chronic diseases classified elsewhere: Secondary | ICD-10-CM | POA: Diagnosis not present

## 2021-07-18 DIAGNOSIS — N401 Enlarged prostate with lower urinary tract symptoms: Secondary | ICD-10-CM | POA: Diagnosis not present

## 2021-07-18 DIAGNOSIS — R809 Proteinuria, unspecified: Secondary | ICD-10-CM | POA: Diagnosis not present

## 2021-07-18 DIAGNOSIS — M4802 Spinal stenosis, cervical region: Secondary | ICD-10-CM | POA: Diagnosis not present

## 2021-07-18 DIAGNOSIS — H409 Unspecified glaucoma: Secondary | ICD-10-CM | POA: Diagnosis not present

## 2021-07-18 DIAGNOSIS — E782 Mixed hyperlipidemia: Secondary | ICD-10-CM | POA: Diagnosis not present

## 2021-07-18 DIAGNOSIS — Z8546 Personal history of malignant neoplasm of prostate: Secondary | ICD-10-CM | POA: Diagnosis not present

## 2021-07-18 DIAGNOSIS — K219 Gastro-esophageal reflux disease without esophagitis: Secondary | ICD-10-CM | POA: Diagnosis not present

## 2021-07-18 DIAGNOSIS — E1169 Type 2 diabetes mellitus with other specified complication: Secondary | ICD-10-CM | POA: Diagnosis not present

## 2021-07-18 DIAGNOSIS — I10 Essential (primary) hypertension: Secondary | ICD-10-CM | POA: Diagnosis not present

## 2021-07-18 DIAGNOSIS — K429 Umbilical hernia without obstruction or gangrene: Secondary | ICD-10-CM | POA: Diagnosis not present

## 2021-07-18 DIAGNOSIS — I739 Peripheral vascular disease, unspecified: Secondary | ICD-10-CM | POA: Diagnosis not present

## 2021-07-21 ENCOUNTER — Other Ambulatory Visit: Payer: Medicare HMO

## 2021-07-23 ENCOUNTER — Other Ambulatory Visit: Payer: Medicare HMO

## 2021-07-23 DIAGNOSIS — C61 Malignant neoplasm of prostate: Secondary | ICD-10-CM

## 2021-07-24 DIAGNOSIS — E1169 Type 2 diabetes mellitus with other specified complication: Secondary | ICD-10-CM | POA: Diagnosis not present

## 2021-07-24 LAB — PSA: Prostate Specific Ag, Serum: 5.7 ng/mL — ABNORMAL HIGH (ref 0.0–4.0)

## 2021-07-28 ENCOUNTER — Encounter: Payer: Self-pay | Admitting: Urology

## 2021-07-28 ENCOUNTER — Ambulatory Visit (INDEPENDENT_AMBULATORY_CARE_PROVIDER_SITE_OTHER): Payer: Medicare HMO | Admitting: Urology

## 2021-07-28 VITALS — BP 107/70 | HR 87

## 2021-07-28 DIAGNOSIS — N138 Other obstructive and reflux uropathy: Secondary | ICD-10-CM | POA: Diagnosis not present

## 2021-07-28 DIAGNOSIS — N3000 Acute cystitis without hematuria: Secondary | ICD-10-CM | POA: Diagnosis not present

## 2021-07-28 DIAGNOSIS — N401 Enlarged prostate with lower urinary tract symptoms: Secondary | ICD-10-CM

## 2021-07-28 DIAGNOSIS — R339 Retention of urine, unspecified: Secondary | ICD-10-CM | POA: Diagnosis not present

## 2021-07-28 DIAGNOSIS — C61 Malignant neoplasm of prostate: Secondary | ICD-10-CM

## 2021-07-28 LAB — URINALYSIS, ROUTINE W REFLEX MICROSCOPIC
Bilirubin, UA: NEGATIVE
Ketones, UA: NEGATIVE
Nitrite, UA: NEGATIVE
Specific Gravity, UA: 1.015 (ref 1.005–1.030)
Urobilinogen, Ur: 0.2 mg/dL (ref 0.2–1.0)
pH, UA: 5 (ref 5.0–7.5)

## 2021-07-28 LAB — BLADDER SCAN AMB NON-IMAGING: Scan Result: 184

## 2021-07-28 LAB — MICROSCOPIC EXAMINATION
Renal Epithel, UA: NONE SEEN /hpf
WBC, UA: >30 /hpf — AB (ref 0–5)

## 2021-07-28 MED ORDER — SILODOSIN 8 MG PO CAPS: 8.0000 mg | ORAL_CAPSULE | Freq: Two times a day (BID) | ORAL | 11 refills | Status: DC

## 2021-07-28 MED ORDER — FINASTERIDE 5 MG PO TABS
5.0000 mg | ORAL_TABLET | Freq: Every day | ORAL | 3 refills | Status: DC
Start: 2021-07-28 — End: 2022-02-09

## 2021-07-28 MED ORDER — SULFAMETHOXAZOLE-TRIMETHOPRIM 800-160 MG PO TABS: 1.0000 | ORAL_TABLET | Freq: Two times a day (BID) | ORAL | 0 refills | Status: DC

## 2021-07-28 NOTE — Progress Notes (Signed)
post void residual=184 

## 2021-07-28 NOTE — Patient Instructions (Signed)

## 2021-07-28 NOTE — Progress Notes (Signed)
07/28/2021 9:59 AM   [place]. [birth year] [id number]  Referring provider: Celene Squibb, MD [place],  [place] [address]  Followup Prostate cancer and BPH   HPI: [name] is a 76yo here for followup for Prostate cancer and BPH. PSA decreased to 5.7 from 10.9 on finasteride. IPSS 30 QOL 6. He is on rapaflo '8mg'$  BID and finasteride. He has been on Jardiance for 3 months which significantly worsened his LUTS. He has dysuria. He has nocturia 5-7x. UA is concerning for infection   PMH: Past Medical History:  Diagnosis Date   Cancer [facility])    prostate   Cervical stenosis of spine    Claudication ([place])    Claudication ([place])    Diabetes mellitus without complication ([place])    GERD (gastroesophageal reflux disease)    Glaucoma (increased eye pressure)    Hyperlipidemia    Hypertension    Wears glasses     Surgical History: Past Surgical History:  Procedure Laterality Date   fracture back     HERNIA REPAIR     POSTERIOR CERVICAL FUSION/FORAMINOTOMY N/A 09/10/2017   Procedure: LAMINECTOMY CERVICAL FOUR - CERVICAL FIVE, CERVICAL FIVE - CERVICAL SIX, CERVICAL SIX - CERVICAL SEVEN, POSTERIOR SEGMENTAL INSTRUMENTATION, POSTERIOR LATERAL ARTHRODESIS;  Surgeon: Consuella Lose, MD;  Location: [place];  Service: Neurosurgery;  Laterality: N/A;  LAMINECTOMY CERVICAL FOUR - CERVICAL FIVE, CERVICAL FIVE - CERVICAL SIX, CERVICAL SIX - CERVICAL SEVEN, POSTERIOR SEGMENTAL    Home Medications:  Allergies as of 07/28/2021       Reactions   Other Other (See Comments)   Augmentin [amoxicillin-pot Clavulanate] Nausea And Vomiting   Hydrocodone Other (See Comments)   " I get goofy and crazy thoughts. "   Oxycontin [oxycodone Hcl] Other (See Comments)   " I couldn't stay still; I was walking around in circles " Pt has tolerated Percocet this medication 08/2017 admssion        Medication List        Accurate as of July 28, 2021  9:59 AM. If you have any  questions, ask your nurse or doctor.          amLODipine 5 MG tablet Commonly known as: NORVASC Take 1 tablet (5 mg total) by mouth daily.   aspirin 325 MG tablet Take 650 mg by mouth every 4 (four) hours as needed.   bismuth subsalicylate 017 CB/44HQ suspension Commonly known as: PEPTO BISMOL Take 30 mLs by mouth every 6 (six) hours as needed.   chlorthalidone 25 MG tablet Commonly known as: HYGROTON TAKE ONE TABLET BY MOUTH ONCE DAILY   cilostazol 50 MG tablet Commonly known as: PLETAL TAKE 1 TABLET BY MOUTH TWICE A DAY   esomeprazole 40 MG capsule Commonly known as: NEXIUM Take 40 mg by mouth daily.   finasteride 5 MG tablet Commonly known as: PROSCAR Take 1 tablet (5 mg total) by mouth daily.   glipiZIDE 5 MG tablet Commonly known as: GLUCOTROL Take 5 mg by mouth 3 (three) times daily with meals.   Jardiance 25 MG Tabs tablet Generic drug: empagliflozin Take 25 mg by mouth daily.   latanoprost 0.005 % ophthalmic solution Commonly known as: XALATAN Place 1 drop into both eyes at bedtime.   losartan 25 MG tablet Commonly known as: COZAAR Take 1 tablet (25 mg total) by mouth daily.   metFORMIN 500 MG tablet Commonly known as: GLUCOPHAGE Take 500 mg by mouth in the morning, at noon, and at  bedtime.   multivitamin with minerals tablet Take 1 tablet by mouth daily.   OneTouch Ultra test strip Generic drug: glucose blood 1 each daily.   rosuvastatin 20 MG tablet Commonly known as: CRESTOR Take 1 tablet (20 mg total) by mouth daily.   silodosin 8 MG Caps capsule Commonly known as: RAPAFLO Take 1 capsule (8 mg total) by mouth 2 (two) times daily.   Tyler Aas FlexTouch 100 UNIT/ML FlexTouch Pen Generic drug: insulin degludec Inject into the skin.        Allergies:  Allergies  Allergen Reactions   Other Other (See Comments)   Augmentin [Amoxicillin-Pot Clavulanate] Nausea And Vomiting   Hydrocodone Other (See Comments)    " I get goofy and  crazy thoughts. "   Oxycontin [Oxycodone Hcl] Other (See Comments)    " I couldn't stay still; I was walking around in circles " Pt has tolerated Percocet this medication 08/2017 admssion    Family History: Family History  Problem Relation Age of Onset   Diabetes Mother    Lupus Mother    Prostate cancer Father     Social History:  reports that he quit smoking about 7 years ago. His smoking use included cigarettes. He has never used smokeless tobacco. He reports current alcohol use. He reports that he does not use drugs.  ROS: All other review of systems were reviewed and are negative except what is noted above in HPI  Physical Exam: BP 107/70   Pulse 87   Constitutional:  Alert and oriented, No acute distress. HEENT: [place] AT, moist mucus membranes.  Trachea midline, no masses. Cardiovascular: No clubbing, cyanosis, or edema. Respiratory: Normal respiratory effort, no increased work of breathing. GI: Abdomen is soft, nontender, nondistended, no abdominal masses GU: No CVA tenderness.  Lymph: No cervical or inguinal lymphadenopathy. Skin: No rashes, bruises or suspicious lesions. Neurologic: Grossly intact, no focal deficits, moving all 4 extremities. Psychiatric: Normal mood and affect.  Laboratory Data: Lab Results  Component Value Date   WBC 5.8 02/03/2021   HGB 12.0 (L) 02/03/2021   HCT 38.1 (L) 02/03/2021   MCV 87.0 02/03/2021   PLT 195 02/03/2021    Lab Results  Component Value Date   CREATININE 1.23 02/03/2021    No results found for: "PSA"  No results found for: "TESTOSTERONE"  No results found for: "HGBA1C"  Urinalysis    Component Value Date/Time   APPEARANCEUR Clear 11/23/2019 0944   GLUCOSEU Negative 11/23/2019 0944   BILIRUBINUR Negative 11/23/2019 0944   PROTEINUR Negative 11/23/2019 0944   NITRITE Negative 11/23/2019 0944   LEUKOCYTESUR Negative 11/23/2019 0944    Lab Results  Component Value Date   LABMICR Comment 11/23/2019     Pertinent Imaging:  No results found for this or any previous visit.  No results found for this or any previous visit.  No results found for this or any previous visit.  No results found for this or any previous visit.  No results found for this or any previous visit.  No results found for this or any previous visit.  No results found for this or any previous visit.  No results found for this or any previous visit.   Assessment & Plan:    1. Malignant neoplasm of prostate ([place]) -RTC 6 months with PSA - Urinalysis, Routine w reflex microscopic  2. Benign prostatic hyperplasia with urinary obstruction -Continue rapaflo '8mg'$  BID and finasteride - Urinalysis, Routine w reflex microscopic  3. Incomplete emptying of bladder -Continue  rapaflo '8mg'$  BID and finasteride - BLADDER SCAN AMB NON-IMAGING  4. Acute cystitis: -Urine for culture -bactrim DS BID for 7 days -Patient instructed to contact Dr. Juel Burrow office to discuss alternative to [place].   No follow-ups on file.  Nicolette Bang, MD  [facility] [place]

## 2021-07-30 LAB — URINE CULTURE

## 2021-08-01 DIAGNOSIS — N3000 Acute cystitis without hematuria: Secondary | ICD-10-CM | POA: Diagnosis not present

## 2021-08-01 DIAGNOSIS — E1169 Type 2 diabetes mellitus with other specified complication: Secondary | ICD-10-CM | POA: Diagnosis not present

## 2021-08-06 ENCOUNTER — Telehealth: Payer: Self-pay

## 2021-08-06 NOTE — Telephone Encounter (Signed)
Patient aware and states he has finished andtibiotics.

## 2021-08-06 NOTE — Telephone Encounter (Signed)
-----   Message from Reynaldo Minium, [place] sent at 08/06/2021 12:10 PM EDT ----- Please let pt know his urine culture shows Bactrim covers the bacteria well. Pt should have completed Rx by now. No need for additional antibx ----- Message ----- From: Iris Pert, LPN Sent: 08/25/3866   5:30 PM EDT To: Cleon Gustin, MD  Patient started on Bactrim

## 2021-08-18 DIAGNOSIS — M79671 Pain in right foot: Secondary | ICD-10-CM | POA: Diagnosis not present

## 2021-08-18 DIAGNOSIS — E1140 Type 2 diabetes mellitus with diabetic neuropathy, unspecified: Secondary | ICD-10-CM | POA: Diagnosis not present

## 2021-08-18 DIAGNOSIS — M79672 Pain in left foot: Secondary | ICD-10-CM | POA: Diagnosis not present

## 2021-08-18 DIAGNOSIS — I739 Peripheral vascular disease, unspecified: Secondary | ICD-10-CM | POA: Diagnosis not present

## 2021-08-24 DIAGNOSIS — E1169 Type 2 diabetes mellitus with other specified complication: Secondary | ICD-10-CM | POA: Diagnosis not present

## 2021-08-27 ENCOUNTER — Ambulatory Visit: Payer: Medicare HMO | Admitting: Cardiology

## 2021-09-24 DIAGNOSIS — E1169 Type 2 diabetes mellitus with other specified complication: Secondary | ICD-10-CM | POA: Diagnosis not present

## 2021-09-25 DIAGNOSIS — E1169 Type 2 diabetes mellitus with other specified complication: Secondary | ICD-10-CM | POA: Diagnosis not present

## 2021-09-25 DIAGNOSIS — E782 Mixed hyperlipidemia: Secondary | ICD-10-CM | POA: Diagnosis not present

## 2021-10-01 DIAGNOSIS — Z0001 Encounter for general adult medical examination with abnormal findings: Secondary | ICD-10-CM | POA: Diagnosis not present

## 2021-10-01 DIAGNOSIS — H409 Unspecified glaucoma: Secondary | ICD-10-CM | POA: Diagnosis not present

## 2021-10-01 DIAGNOSIS — Z8546 Personal history of malignant neoplasm of prostate: Secondary | ICD-10-CM | POA: Diagnosis not present

## 2021-10-01 DIAGNOSIS — D638 Anemia in other chronic diseases classified elsewhere: Secondary | ICD-10-CM | POA: Diagnosis not present

## 2021-10-01 DIAGNOSIS — Z23 Encounter for immunization: Secondary | ICD-10-CM | POA: Diagnosis not present

## 2021-10-01 DIAGNOSIS — M4802 Spinal stenosis, cervical region: Secondary | ICD-10-CM | POA: Diagnosis not present

## 2021-10-01 DIAGNOSIS — E1369 Other specified diabetes mellitus with other specified complication: Secondary | ICD-10-CM | POA: Diagnosis not present

## 2021-10-01 DIAGNOSIS — I739 Peripheral vascular disease, unspecified: Secondary | ICD-10-CM | POA: Diagnosis not present

## 2021-10-01 DIAGNOSIS — E782 Mixed hyperlipidemia: Secondary | ICD-10-CM | POA: Diagnosis not present

## 2021-10-01 DIAGNOSIS — N401 Enlarged prostate with lower urinary tract symptoms: Secondary | ICD-10-CM | POA: Diagnosis not present

## 2021-10-01 DIAGNOSIS — R809 Proteinuria, unspecified: Secondary | ICD-10-CM | POA: Diagnosis not present

## 2021-10-01 DIAGNOSIS — I10 Essential (primary) hypertension: Secondary | ICD-10-CM | POA: Diagnosis not present

## 2021-10-03 ENCOUNTER — Other Ambulatory Visit: Payer: Self-pay

## 2021-10-03 MED ORDER — CHLORTHALIDONE 25 MG PO TABS: 25.0000 mg | ORAL_TABLET | Freq: Every day | ORAL | 3 refills | Status: DC

## 2021-10-27 DIAGNOSIS — M79671 Pain in right foot: Secondary | ICD-10-CM | POA: Diagnosis not present

## 2021-10-27 DIAGNOSIS — I739 Peripheral vascular disease, unspecified: Secondary | ICD-10-CM | POA: Diagnosis not present

## 2021-10-27 DIAGNOSIS — M79672 Pain in left foot: Secondary | ICD-10-CM | POA: Diagnosis not present

## 2021-10-27 DIAGNOSIS — E1140 Type 2 diabetes mellitus with diabetic neuropathy, unspecified: Secondary | ICD-10-CM | POA: Diagnosis not present

## 2021-10-29 DIAGNOSIS — E1369 Other specified diabetes mellitus with other specified complication: Secondary | ICD-10-CM | POA: Diagnosis not present

## 2021-10-29 DIAGNOSIS — I10 Essential (primary) hypertension: Secondary | ICD-10-CM | POA: Diagnosis not present

## 2021-10-29 DIAGNOSIS — E663 Overweight: Secondary | ICD-10-CM | POA: Diagnosis not present

## 2021-11-06 DIAGNOSIS — E1169 Type 2 diabetes mellitus with other specified complication: Secondary | ICD-10-CM | POA: Diagnosis not present

## 2021-12-07 DIAGNOSIS — E1169 Type 2 diabetes mellitus with other specified complication: Secondary | ICD-10-CM | POA: Diagnosis not present

## 2021-12-30 DIAGNOSIS — I10 Essential (primary) hypertension: Secondary | ICD-10-CM | POA: Diagnosis not present

## 2021-12-30 DIAGNOSIS — E1369 Other specified diabetes mellitus with other specified complication: Secondary | ICD-10-CM | POA: Diagnosis not present

## 2022-01-05 DIAGNOSIS — M79671 Pain in right foot: Secondary | ICD-10-CM | POA: Diagnosis not present

## 2022-01-05 DIAGNOSIS — E1140 Type 2 diabetes mellitus with diabetic neuropathy, unspecified: Secondary | ICD-10-CM | POA: Diagnosis not present

## 2022-01-05 DIAGNOSIS — I739 Peripheral vascular disease, unspecified: Secondary | ICD-10-CM | POA: Diagnosis not present

## 2022-01-05 DIAGNOSIS — M79672 Pain in left foot: Secondary | ICD-10-CM | POA: Diagnosis not present

## 2022-01-06 DIAGNOSIS — E1169 Type 2 diabetes mellitus with other specified complication: Secondary | ICD-10-CM | POA: Diagnosis not present

## 2022-01-08 DIAGNOSIS — M4802 Spinal stenosis, cervical region: Secondary | ICD-10-CM | POA: Diagnosis not present

## 2022-01-08 DIAGNOSIS — I700 Atherosclerosis of aorta: Secondary | ICD-10-CM | POA: Insufficient documentation

## 2022-01-08 DIAGNOSIS — H409 Unspecified glaucoma: Secondary | ICD-10-CM | POA: Diagnosis not present

## 2022-01-08 DIAGNOSIS — R809 Proteinuria, unspecified: Secondary | ICD-10-CM | POA: Diagnosis not present

## 2022-01-08 DIAGNOSIS — E669 Obesity, unspecified: Secondary | ICD-10-CM | POA: Diagnosis not present

## 2022-01-08 DIAGNOSIS — N401 Enlarged prostate with lower urinary tract symptoms: Secondary | ICD-10-CM | POA: Diagnosis not present

## 2022-01-08 DIAGNOSIS — D638 Anemia in other chronic diseases classified elsewhere: Secondary | ICD-10-CM | POA: Diagnosis not present

## 2022-01-08 DIAGNOSIS — I10 Essential (primary) hypertension: Secondary | ICD-10-CM | POA: Diagnosis not present

## 2022-01-08 DIAGNOSIS — E782 Mixed hyperlipidemia: Secondary | ICD-10-CM | POA: Diagnosis not present

## 2022-01-08 DIAGNOSIS — E1169 Type 2 diabetes mellitus with other specified complication: Secondary | ICD-10-CM | POA: Diagnosis not present

## 2022-01-08 DIAGNOSIS — E1151 Type 2 diabetes mellitus with diabetic peripheral angiopathy without gangrene: Secondary | ICD-10-CM | POA: Diagnosis not present

## 2022-01-08 DIAGNOSIS — K219 Gastro-esophageal reflux disease without esophagitis: Secondary | ICD-10-CM | POA: Diagnosis not present

## 2022-01-28 ENCOUNTER — Ambulatory Visit: Payer: Medicare HMO | Admitting: Urology

## 2022-02-02 ENCOUNTER — Other Ambulatory Visit: Payer: Self-pay

## 2022-02-02 ENCOUNTER — Other Ambulatory Visit: Payer: Medicare HMO

## 2022-02-02 DIAGNOSIS — C61 Malignant neoplasm of prostate: Secondary | ICD-10-CM

## 2022-02-03 LAB — PSA: Prostate Specific Ag, Serum: 5.7 ng/mL — ABNORMAL HIGH (ref 0.0–4.0)

## 2022-02-04 ENCOUNTER — Other Ambulatory Visit (HOSPITAL_COMMUNITY): Payer: Self-pay | Admitting: Family Medicine

## 2022-02-04 ENCOUNTER — Other Ambulatory Visit: Payer: Medicare HMO

## 2022-02-04 DIAGNOSIS — R109 Unspecified abdominal pain: Secondary | ICD-10-CM | POA: Diagnosis not present

## 2022-02-04 DIAGNOSIS — R1011 Right upper quadrant pain: Secondary | ICD-10-CM | POA: Diagnosis not present

## 2022-02-04 DIAGNOSIS — M791 Myalgia, unspecified site: Secondary | ICD-10-CM | POA: Diagnosis not present

## 2022-02-06 DIAGNOSIS — E1169 Type 2 diabetes mellitus with other specified complication: Secondary | ICD-10-CM | POA: Diagnosis not present

## 2022-02-09 ENCOUNTER — Ambulatory Visit: Payer: Medicare HMO | Admitting: Urology

## 2022-02-09 ENCOUNTER — Encounter: Payer: Self-pay | Admitting: Urology

## 2022-02-09 VITALS — BP 151/62 | HR 89

## 2022-02-09 DIAGNOSIS — C61 Malignant neoplasm of prostate: Secondary | ICD-10-CM

## 2022-02-09 DIAGNOSIS — R339 Retention of urine, unspecified: Secondary | ICD-10-CM

## 2022-02-09 DIAGNOSIS — N401 Enlarged prostate with lower urinary tract symptoms: Secondary | ICD-10-CM

## 2022-02-09 DIAGNOSIS — N138 Other obstructive and reflux uropathy: Secondary | ICD-10-CM

## 2022-02-09 LAB — URINALYSIS, ROUTINE W REFLEX MICROSCOPIC
Bilirubin, UA: NEGATIVE
Glucose, UA: NEGATIVE
Ketones, UA: NEGATIVE
Leukocytes,UA: NEGATIVE
Nitrite, UA: NEGATIVE
Protein,UA: NEGATIVE
RBC, UA: NEGATIVE
Specific Gravity, UA: 1.025 (ref 1.005–1.030)
Urobilinogen, Ur: 0.2 mg/dL (ref 0.2–1.0)
pH, UA: 5.5 (ref 5.0–7.5)

## 2022-02-09 MED ORDER — FINASTERIDE 5 MG PO TABS: 5.0000 mg | ORAL_TABLET | Freq: Every day | ORAL | 3 refills | Status: DC

## 2022-02-09 MED ORDER — SILODOSIN 8 MG PO CAPS: 8.0000 mg | ORAL_CAPSULE | Freq: Two times a day (BID) | ORAL | 11 refills | Status: DC

## 2022-02-09 NOTE — Progress Notes (Signed)
post void residual=233

## 2022-02-09 NOTE — Progress Notes (Signed)
02/09/2022 8:48 AM   [place]. [birth year] [id number]  Referring provider: Celene Squibb, MD [address],  [place] [address]  Followup BPH and Prostate cancer   HPI: Mr Tufo is a 77yo here for followup for prostate cancer and BPH. PSA stable at 5.7 on finasteride,. IPSS 21 QOL 3 on rapaflo '8mg'$  BID and finasteride. Urine stream fair. Nocturia 2-3x. He is off Jardiance which has improved his LUTS. He is not bothered enough by his LUTS to consider surgery for BPH. He recently started taking aspirin at night and notes he does not get up as much at night to urinate.    PMH: Past Medical History:  Diagnosis Date   Cancer [facility])    prostate   Cervical stenosis of spine    Claudication ([place])    Claudication ([place])    Diabetes mellitus without complication ([place])    GERD (gastroesophageal reflux disease)    Glaucoma (increased eye pressure)    Hyperlipidemia    Hypertension    Wears glasses     Surgical History: Past Surgical History:  Procedure Laterality Date   fracture back     HERNIA REPAIR     POSTERIOR CERVICAL FUSION/FORAMINOTOMY N/A 09/10/2017   Procedure: LAMINECTOMY CERVICAL FOUR - CERVICAL FIVE, CERVICAL FIVE - CERVICAL SIX, CERVICAL SIX - CERVICAL SEVEN, POSTERIOR SEGMENTAL INSTRUMENTATION, POSTERIOR LATERAL ARTHRODESIS;  Surgeon: Consuella Lose, MD;  Location: [place];  Service: Neurosurgery;  Laterality: N/A;  LAMINECTOMY CERVICAL FOUR - CERVICAL FIVE, CERVICAL FIVE - CERVICAL SIX, CERVICAL SIX - CERVICAL SEVEN, POSTERIOR SEGMENTAL    Home Medications:  Allergies as of 02/09/2022       Reactions   Other Other (See Comments)   Augmentin [amoxicillin-pot Clavulanate] Nausea And Vomiting   Hydrocodone Other (See Comments)   " I get goofy and crazy thoughts. "   Oxycontin [oxycodone Hcl] Other (See Comments)   " I couldn't stay still; I was walking around in circles " Pt has tolerated Percocet this medication 08/2017 admssion         Medication List        Accurate as of February 09, 2022  8:48 AM. If you have any questions, ask your nurse or doctor.          alfuzosin 10 MG 24 hr tablet Commonly known as: UROXATRAL 1 mg every day by oral route.   amLODipine 5 MG tablet Commonly known as: NORVASC Take 1 tablet (5 mg total) by mouth daily.   aspirin 325 MG tablet Take 650 mg by mouth every 4 (four) hours as needed.   bismuth subsalicylate 109 NA/35TD suspension Commonly known as: PEPTO BISMOL Take 30 mLs by mouth every 6 (six) hours as needed.   chlorthalidone 25 MG tablet Commonly known as: HYGROTON Take 1 tablet (25 mg total) by mouth daily.   cilostazol 50 MG tablet Commonly known as: PLETAL TAKE 1 TABLET BY MOUTH TWICE A DAY   esomeprazole 40 MG capsule Commonly known as: NEXIUM Take 40 mg by mouth daily.   finasteride 5 MG tablet Commonly known as: PROSCAR Take 1 tablet (5 mg total) by mouth daily.   gabapentin 300 MG capsule Commonly known as: NEURONTIN TAKE 1 CAPSULE BY MOUTH THREE TIMES A DAY AS NEEDED FOR NERVE PAIN   glipiZIDE 5 MG tablet Commonly known as: GLUCOTROL Take 5 mg by mouth 3 (three) times daily with meals.   Jardiance 25 MG Tabs tablet Generic drug: empagliflozin Take 25  mg by mouth daily.   latanoprost 0.005 % ophthalmic solution Commonly known as: XALATAN Place 1 drop into both eyes at bedtime.   LORazepam 0.5 MG tablet Commonly known as: ATIVAN TAKE HALF TO 1 TABLET BY MOUTH EVERY 8 HOURS AS NEEDED FOR ANXIETY ,AGITATION, OR INSOMNIA   losartan 25 MG tablet Commonly known as: COZAAR Take 1 tablet (25 mg total) by mouth daily.   metFORMIN 500 MG tablet Commonly known as: GLUCOPHAGE Take 500 mg by mouth in the morning, at noon, and at bedtime.   methocarbamol 500 MG tablet Commonly known as: ROBAXIN Take 2 tablets 4 times a day by oral route for 5 days.   multivitamin with minerals tablet Take 1 tablet by mouth daily.   OneTouch Ultra test  strip Generic drug: glucose blood 1 each daily.   rosuvastatin 20 MG tablet Commonly known as: CRESTOR Take 1 tablet (20 mg total) by mouth daily.   silodosin 8 MG Caps capsule Commonly known as: RAPAFLO Take 1 capsule (8 mg total) by mouth 2 (two) times daily.   sulfamethoxazole-trimethoprim 800-160 MG tablet Commonly known as: BACTRIM DS Take 1 tablet by mouth every 12 (twelve) hours.   Tyler Aas FlexTouch 100 UNIT/ML FlexTouch Pen Generic drug: insulin degludec Inject into the skin.        Allergies:  Allergies  Allergen Reactions   Other Other (See Comments)   Augmentin [Amoxicillin-Pot Clavulanate] Nausea And Vomiting   Hydrocodone Other (See Comments)    " I get goofy and crazy thoughts. "   Oxycontin [Oxycodone Hcl] Other (See Comments)    " I couldn't stay still; I was walking around in circles " Pt has tolerated Percocet this medication 08/2017 admssion    Family History: Family History  Problem Relation Age of Onset   Diabetes Mother    Lupus Mother    Prostate cancer Father     Social History:  reports that he quit smoking about 7 years ago. His smoking use included cigarettes. He has never used smokeless tobacco. He reports current alcohol use. He reports that he does not use drugs.  ROS: All other review of systems were reviewed and are negative except what is noted above in HPI  Physical Exam: BP (!) 151/62   Pulse 89   Constitutional:  Alert and oriented, No acute distress. HEENT: [place] AT, moist mucus membranes.  Trachea midline, no masses. Cardiovascular: No clubbing, cyanosis, or edema. Respiratory: Normal respiratory effort, no increased work of breathing. GI: Abdomen is soft, nontender, nondistended, no abdominal masses GU: No CVA tenderness.  Lymph: No cervical or inguinal lymphadenopathy. Skin: No rashes, bruises or suspicious lesions. Neurologic: Grossly intact, no focal deficits, moving all 4 extremities. Psychiatric: Normal mood and  affect.  Laboratory Data: Lab Results  Component Value Date   WBC 5.8 02/03/2021   HGB 12.0 (L) 02/03/2021   HCT 38.1 (L) 02/03/2021   MCV 87.0 02/03/2021   PLT 195 02/03/2021    Lab Results  Component Value Date   CREATININE 1.23 02/03/2021    No results found for: "PSA"  No results found for: "TESTOSTERONE"  No results found for: "HGBA1C"  Urinalysis    Component Value Date/Time   APPEARANCEUR Cloudy (A) 07/28/2021 1011   GLUCOSEU 3+ (A) 07/28/2021 1011   BILIRUBINUR Negative 07/28/2021 1011   PROTEINUR Trace (A) 07/28/2021 1011   NITRITE Negative 07/28/2021 1011   LEUKOCYTESUR 1+ (A) 07/28/2021 1011    Lab Results  Component Value Date   LABMICR  See below: 07/28/2021   WBCUA >30 (A) 07/28/2021   LABEPIT 0-10 07/28/2021   MUCUS Present 07/28/2021   BACTERIA Few 07/28/2021    Pertinent Imaging:  No results found for this or any previous visit.  No results found for this or any previous visit.  No results found for this or any previous visit.  No results found for this or any previous visit.  No results found for this or any previous visit.  No valid procedures specified. No results found for this or any previous visit.  No results found for this or any previous visit.   Assessment & Plan:    1. Malignant neoplasm of prostate (HCC) -Followup 6 months with PSA - Urinalysis, Routine w reflex microscopic - BLADDER SCAN AMB NON-IMAGING  2. Benign prostatic hyperplasia with urinary obstruction -continue rapaflo '8mg'$  BID and finasteride  3. Incomplete emptying of bladder -Continue finasteride '5mg'$  daily and rapaflo '8mg'$  BID   No follow-ups on file.  Nicolette Bang, MD  [facility] [place]

## 2022-02-09 NOTE — Patient Instructions (Signed)

## 2022-02-12 ENCOUNTER — Ambulatory Visit (HOSPITAL_COMMUNITY): Payer: Medicare HMO

## 2022-02-19 ENCOUNTER — Other Ambulatory Visit: Payer: Self-pay | Admitting: Cardiology

## 2022-02-20 ENCOUNTER — Ambulatory Visit (HOSPITAL_COMMUNITY)
Admission: RE | Admit: 2022-02-20 | Discharge: 2022-02-20 | Disposition: A | Discharge status: Home / Self Care | Payer: Medicare HMO | Source: Ambulatory Visit | Attending: Family Medicine | Admitting: Family Medicine

## 2022-02-20 DIAGNOSIS — R109 Unspecified abdominal pain: Secondary | ICD-10-CM

## 2022-02-20 DIAGNOSIS — K7689 Other specified diseases of liver: Secondary | ICD-10-CM | POA: Diagnosis not present

## 2022-02-25 DIAGNOSIS — Z961 Presence of intraocular lens: Secondary | ICD-10-CM | POA: Diagnosis not present

## 2022-02-25 DIAGNOSIS — H40023 Open angle with borderline findings, high risk, bilateral: Secondary | ICD-10-CM | POA: Diagnosis not present

## 2022-03-09 DIAGNOSIS — E1169 Type 2 diabetes mellitus with other specified complication: Secondary | ICD-10-CM | POA: Diagnosis not present

## 2022-03-20 DIAGNOSIS — R059 Cough, unspecified: Secondary | ICD-10-CM | POA: Diagnosis not present

## 2022-03-20 DIAGNOSIS — J069 Acute upper respiratory infection, unspecified: Secondary | ICD-10-CM | POA: Diagnosis not present

## 2022-03-20 DIAGNOSIS — R0981 Nasal congestion: Secondary | ICD-10-CM | POA: Diagnosis not present

## 2022-03-22 ENCOUNTER — Other Ambulatory Visit: Payer: Self-pay | Admitting: Cardiology

## 2022-04-07 DIAGNOSIS — E1169 Type 2 diabetes mellitus with other specified complication: Secondary | ICD-10-CM | POA: Diagnosis not present

## 2022-04-09 ENCOUNTER — Ambulatory Visit (HOSPITAL_COMMUNITY)
Admission: RE | Admit: 2022-04-09 | Discharge: 2022-04-09 | Disposition: A | Discharge status: Home / Self Care | Payer: Medicare HMO | Source: Ambulatory Visit | Attending: Family Medicine | Admitting: Family Medicine

## 2022-04-09 ENCOUNTER — Other Ambulatory Visit (HOSPITAL_COMMUNITY): Payer: Self-pay | Admitting: Family Medicine

## 2022-04-09 DIAGNOSIS — Z20822 Contact with and (suspected) exposure to covid-19: Secondary | ICD-10-CM | POA: Diagnosis not present

## 2022-04-09 DIAGNOSIS — R059 Cough, unspecified: Secondary | ICD-10-CM | POA: Diagnosis not present

## 2022-04-09 DIAGNOSIS — J019 Acute sinusitis, unspecified: Secondary | ICD-10-CM

## 2022-04-09 DIAGNOSIS — R0600 Dyspnea, unspecified: Secondary | ICD-10-CM | POA: Diagnosis not present

## 2022-04-09 DIAGNOSIS — J9811 Atelectasis: Secondary | ICD-10-CM | POA: Diagnosis not present

## 2022-04-09 DIAGNOSIS — R0602 Shortness of breath: Secondary | ICD-10-CM | POA: Diagnosis not present

## 2022-04-22 DIAGNOSIS — E1169 Type 2 diabetes mellitus with other specified complication: Secondary | ICD-10-CM | POA: Diagnosis not present

## 2022-04-22 DIAGNOSIS — I10 Essential (primary) hypertension: Secondary | ICD-10-CM | POA: Diagnosis not present

## 2022-04-23 DIAGNOSIS — Z0000 Encounter for general adult medical examination without abnormal findings: Secondary | ICD-10-CM | POA: Diagnosis not present

## 2022-04-24 DIAGNOSIS — I10 Essential (primary) hypertension: Secondary | ICD-10-CM | POA: Diagnosis not present

## 2022-04-24 DIAGNOSIS — N401 Enlarged prostate with lower urinary tract symptoms: Secondary | ICD-10-CM | POA: Diagnosis not present

## 2022-04-24 DIAGNOSIS — M4802 Spinal stenosis, cervical region: Secondary | ICD-10-CM | POA: Diagnosis not present

## 2022-04-24 DIAGNOSIS — E1151 Type 2 diabetes mellitus with diabetic peripheral angiopathy without gangrene: Secondary | ICD-10-CM | POA: Diagnosis not present

## 2022-04-24 DIAGNOSIS — E782 Mixed hyperlipidemia: Secondary | ICD-10-CM | POA: Diagnosis not present

## 2022-04-24 DIAGNOSIS — E1169 Type 2 diabetes mellitus with other specified complication: Secondary | ICD-10-CM | POA: Diagnosis not present

## 2022-04-24 DIAGNOSIS — I700 Atherosclerosis of aorta: Secondary | ICD-10-CM | POA: Diagnosis not present

## 2022-04-24 DIAGNOSIS — D638 Anemia in other chronic diseases classified elsewhere: Secondary | ICD-10-CM | POA: Diagnosis not present

## 2022-04-24 DIAGNOSIS — E669 Obesity, unspecified: Secondary | ICD-10-CM | POA: Diagnosis not present

## 2022-04-24 DIAGNOSIS — K219 Gastro-esophageal reflux disease without esophagitis: Secondary | ICD-10-CM | POA: Diagnosis not present

## 2022-04-24 DIAGNOSIS — R809 Proteinuria, unspecified: Secondary | ICD-10-CM | POA: Diagnosis not present

## 2022-04-24 DIAGNOSIS — H409 Unspecified glaucoma: Secondary | ICD-10-CM | POA: Diagnosis not present

## 2022-05-08 DIAGNOSIS — E1169 Type 2 diabetes mellitus with other specified complication: Secondary | ICD-10-CM | POA: Diagnosis not present

## 2022-05-11 DIAGNOSIS — I739 Peripheral vascular disease, unspecified: Secondary | ICD-10-CM | POA: Diagnosis not present

## 2022-05-11 DIAGNOSIS — E1140 Type 2 diabetes mellitus with diabetic neuropathy, unspecified: Secondary | ICD-10-CM | POA: Diagnosis not present

## 2022-05-11 DIAGNOSIS — M79671 Pain in right foot: Secondary | ICD-10-CM | POA: Diagnosis not present

## 2022-05-11 DIAGNOSIS — M79672 Pain in left foot: Secondary | ICD-10-CM | POA: Diagnosis not present

## 2022-06-07 DIAGNOSIS — E1169 Type 2 diabetes mellitus with other specified complication: Secondary | ICD-10-CM | POA: Diagnosis not present

## 2022-06-17 DIAGNOSIS — H1031 Unspecified acute conjunctivitis, right eye: Secondary | ICD-10-CM | POA: Diagnosis not present

## 2022-06-17 DIAGNOSIS — H10011 Acute follicular conjunctivitis, right eye: Secondary | ICD-10-CM | POA: Diagnosis not present

## 2022-07-08 DIAGNOSIS — E1169 Type 2 diabetes mellitus with other specified complication: Secondary | ICD-10-CM | POA: Diagnosis not present

## 2022-08-03 ENCOUNTER — Other Ambulatory Visit: Payer: Medicare HMO

## 2022-08-04 ENCOUNTER — Other Ambulatory Visit: Payer: Medicare HMO

## 2022-08-04 DIAGNOSIS — C61 Malignant neoplasm of prostate: Secondary | ICD-10-CM | POA: Diagnosis not present

## 2022-08-05 LAB — PSA, TOTAL AND FREE
PSA, Free Pct: 22.7 %
PSA, Free: 1.27 ng/mL
Prostate Specific Ag, Serum: 5.6 ng/mL — ABNORMAL HIGH (ref 0.0–4.0)

## 2022-08-07 DIAGNOSIS — E1169 Type 2 diabetes mellitus with other specified complication: Secondary | ICD-10-CM | POA: Diagnosis not present

## 2022-08-10 ENCOUNTER — Encounter: Payer: Self-pay | Admitting: Urology

## 2022-08-10 ENCOUNTER — Ambulatory Visit: Payer: Medicare HMO | Admitting: Urology

## 2022-08-10 VITALS — BP 105/61 | HR 88

## 2022-08-10 DIAGNOSIS — C61 Malignant neoplasm of prostate: Secondary | ICD-10-CM

## 2022-08-10 DIAGNOSIS — N401 Enlarged prostate with lower urinary tract symptoms: Secondary | ICD-10-CM

## 2022-08-10 DIAGNOSIS — R339 Retention of urine, unspecified: Secondary | ICD-10-CM

## 2022-08-10 DIAGNOSIS — N138 Other obstructive and reflux uropathy: Secondary | ICD-10-CM

## 2022-08-10 NOTE — Patient Instructions (Signed)

## 2022-08-10 NOTE — Progress Notes (Signed)
08/10/2022 9:24 AM   Carmelina Paddock [place]. 18-Aug-1945 [id number]  Referring provider: Benita Stabile, MD [address] [place],  [place] [address]  Followup prostate cancer and BPH   HPI: Mr Merlos is a 76yo here for followup for prostate cancer and BPH. PSA stable at 5.6. IPSS 26 QOL 4 on rapaflo 8mg  BID.  Urine stream fair. He did not take his medications for 2 weeks while his wife was admitted to [facility]. He noted his stream was worse after stopping the rapaflo 8mg .    PMH: Past Medical History:  Diagnosis Date   Cancer [facility])    prostate   Cervical stenosis of spine    Claudication ([facility])    Claudication ([facility])    Diabetes mellitus without complication (HCC)    GERD (gastroesophageal reflux disease)    Glaucoma (increased eye pressure)    Hyperlipidemia    Hypertension    Wears glasses     Surgical History: Past Surgical History:  Procedure Laterality Date   fracture back     HERNIA REPAIR     POSTERIOR CERVICAL FUSION/FORAMINOTOMY N/A 09/10/2017   Procedure: LAMINECTOMY CERVICAL FOUR - CERVICAL FIVE, CERVICAL FIVE - CERVICAL SIX, CERVICAL SIX - CERVICAL SEVEN, POSTERIOR SEGMENTAL INSTRUMENTATION, POSTERIOR LATERAL ARTHRODESIS;  Surgeon: Lisbeth Renshaw, MD;  Location: [facility];  Service: Neurosurgery;  Laterality: N/A;  LAMINECTOMY CERVICAL FOUR - CERVICAL FIVE, CERVICAL FIVE - CERVICAL SIX, CERVICAL SIX - CERVICAL SEVEN, POSTERIOR SEGMENTAL    Home Medications:  Allergies as of 08/10/2022       Reactions   Other Other (See Comments)   Augmentin [amoxicillin-pot Clavulanate] Nausea And Vomiting   Hydrocodone Other (See Comments)   " I get goofy and crazy thoughts. "   Oxycontin [oxycodone Hcl] Other (See Comments)   " I couldn't stay still; I was walking around in circles " Pt has tolerated Percocet this medication 08/2017 admssion        Medication List        Accurate as of August 10, 2022  9:24 AM. If you have any questions, ask your nurse or doctor.           alfuzosin 10 MG 24 hr tablet Commonly known as: UROXATRAL 1 mg every day by oral route.   amLODipine 5 MG tablet Commonly known as: NORVASC Take 1 tablet (5 mg total) by mouth daily.   aspirin 325 MG tablet Take 650 mg by mouth every 4 (four) hours as needed.   bismuth subsalicylate 262 MG/15ML suspension Commonly known as: PEPTO BISMOL Take 30 mLs by mouth every 6 (six) hours as needed.   chlorthalidone 25 MG tablet Commonly known as: HYGROTON TAKE ONE TABLET BY MOUTH ONCE DAILY   cilostazol 50 MG tablet Commonly known as: PLETAL TAKE 1 TABLET BY MOUTH TWICE A DAY   esomeprazole 40 MG capsule Commonly known as: NEXIUM Take 40 mg by mouth daily.   finasteride 5 MG tablet Commonly known as: PROSCAR Take 1 tablet (5 mg total) by mouth daily.   gabapentin 300 MG capsule Commonly known as: NEURONTIN TAKE 1 CAPSULE BY MOUTH THREE TIMES A DAY AS NEEDED FOR NERVE PAIN   glipiZIDE 5 MG tablet Commonly known as: GLUCOTROL Take 5 mg by mouth 3 (three) times daily with meals.   Jardiance 25 MG Tabs tablet Generic drug: empagliflozin Take 25 mg by mouth daily.   latanoprost 0.005 % ophthalmic solution Commonly known as: XALATAN Place 1 drop into both eyes at  bedtime.   LORazepam 0.5 MG tablet Commonly known as: ATIVAN TAKE HALF TO 1 TABLET BY MOUTH EVERY 8 HOURS AS NEEDED FOR ANXIETY ,AGITATION, OR INSOMNIA   losartan 25 MG tablet Commonly known as: COZAAR Take 1 tablet (25 mg total) by mouth daily.   metFORMIN 500 MG tablet Commonly known as: GLUCOPHAGE Take 500 mg by mouth in the morning, at noon, and at bedtime.   methocarbamol 500 MG tablet Commonly known as: ROBAXIN Take 2 tablets 4 times a day by oral route for 5 days.   multivitamin with minerals tablet Take 1 tablet by mouth daily.   OneTouch Ultra test strip Generic drug: glucose blood 1 each daily.   rosuvastatin 20 MG tablet Commonly known as: CRESTOR Take 1 tablet (20 mg total) by  mouth daily.   silodosin 8 MG Caps capsule Commonly known as: RAPAFLO Take 1 capsule (8 mg total) by mouth 2 (two) times daily.   sulfamethoxazole-trimethoprim 800-160 MG tablet Commonly known as: BACTRIM DS Take 1 tablet by mouth every 12 (twelve) hours.   Evaristo Bury FlexTouch 100 UNIT/ML FlexTouch Pen Generic drug: insulin degludec Inject into the skin.        Allergies:  Allergies  Allergen Reactions   Other Other (See Comments)   Augmentin [Amoxicillin-Pot Clavulanate] Nausea And Vomiting   Hydrocodone Other (See Comments)    " I get goofy and crazy thoughts. "   Oxycontin [Oxycodone Hcl] Other (See Comments)    " I couldn't stay still; I was walking around in circles " Pt has tolerated Percocet this medication 08/2017 admssion    Family History: Family History  Problem Relation Age of Onset   Diabetes Mother    Lupus Mother    Prostate cancer Father     Social History:  reports that he quit smoking about 8 years ago. His smoking use included cigarettes. He has never used smokeless tobacco. He reports current alcohol use. He reports that he does not use drugs.  ROS: All other review of systems were reviewed and are negative except what is noted above in HPI  Physical Exam: BP 105/61   Pulse 88   Constitutional:  Alert and oriented, No acute distress. HEENT: [place] AT, moist mucus membranes.  Trachea midline, no masses. Cardiovascular: No clubbing, cyanosis, or edema. Respiratory: Normal respiratory effort, no increased work of breathing. GI: Abdomen is soft, nontender, nondistended, no abdominal masses GU: No CVA tenderness.  Lymph: No cervical or inguinal lymphadenopathy. Skin: No rashes, bruises or suspicious lesions. Neurologic: Grossly intact, no focal deficits, moving all 4 extremities. Psychiatric: Normal mood and affect.  Laboratory Data: Lab Results  Component Value Date   WBC 5.8 02/03/2021   HGB 12.0 (L) 02/03/2021   HCT 38.1 (L) 02/03/2021   MCV  87.0 02/03/2021   PLT 195 02/03/2021    Lab Results  Component Value Date   CREATININE 1.23 02/03/2021    No results found for: "PSA"  No results found for: "TESTOSTERONE"  No results found for: "HGBA1C"  Urinalysis    Component Value Date/Time   APPEARANCEUR Clear 02/09/2022 0842   GLUCOSEU Negative 02/09/2022 0842   BILIRUBINUR Negative 02/09/2022 0842   PROTEINUR Negative 02/09/2022 0842   NITRITE Negative 02/09/2022 0842   LEUKOCYTESUR Negative 02/09/2022 0842    Lab Results  Component Value Date   LABMICR Comment 02/09/2022   WBCUA >30 (A) 07/28/2021   LABEPIT 0-10 07/28/2021   MUCUS Present 07/28/2021   BACTERIA Few 07/28/2021    Pertinent  Imaging:  No results found for this or any previous visit.  No results found for this or any previous visit.  No results found for this or any previous visit.  No results found for this or any previous visit.  No results found for this or any previous visit.  No valid procedures specified. No results found for this or any previous visit.  No results found for this or any previous visit.   Assessment & Plan:    1. Malignant neoplasm of prostate (HCC) -6 months with a PSA  2. Benign prostatic hyperplasia with urinary obstruction -continue rapaflo 8mg  BID  3. Incomplete emptying of bladder Continue rapaflo 8mg  BID   No follow-ups on file.  Wilkie Aye, MD  [facility] [place]

## 2022-09-11 DIAGNOSIS — E1169 Type 2 diabetes mellitus with other specified complication: Secondary | ICD-10-CM | POA: Diagnosis not present

## 2022-09-11 DIAGNOSIS — I10 Essential (primary) hypertension: Secondary | ICD-10-CM | POA: Diagnosis not present

## 2022-09-15 ENCOUNTER — Other Ambulatory Visit: Payer: Self-pay | Admitting: Cardiology

## 2022-09-23 DIAGNOSIS — N401 Enlarged prostate with lower urinary tract symptoms: Secondary | ICD-10-CM | POA: Diagnosis not present

## 2022-09-23 DIAGNOSIS — E1151 Type 2 diabetes mellitus with diabetic peripheral angiopathy without gangrene: Secondary | ICD-10-CM | POA: Diagnosis not present

## 2022-09-23 DIAGNOSIS — E1159 Type 2 diabetes mellitus with other circulatory complications: Secondary | ICD-10-CM | POA: Diagnosis not present

## 2022-09-23 DIAGNOSIS — E669 Obesity, unspecified: Secondary | ICD-10-CM | POA: Diagnosis not present

## 2022-09-23 DIAGNOSIS — E782 Mixed hyperlipidemia: Secondary | ICD-10-CM | POA: Diagnosis not present

## 2022-09-23 DIAGNOSIS — E1169 Type 2 diabetes mellitus with other specified complication: Secondary | ICD-10-CM | POA: Diagnosis not present

## 2022-09-23 DIAGNOSIS — E1165 Type 2 diabetes mellitus with hyperglycemia: Secondary | ICD-10-CM | POA: Diagnosis not present

## 2022-09-23 DIAGNOSIS — I700 Atherosclerosis of aorta: Secondary | ICD-10-CM | POA: Diagnosis not present

## 2022-09-23 DIAGNOSIS — Z23 Encounter for immunization: Secondary | ICD-10-CM | POA: Diagnosis not present

## 2022-09-23 DIAGNOSIS — H409 Unspecified glaucoma: Secondary | ICD-10-CM | POA: Diagnosis not present

## 2022-09-23 DIAGNOSIS — R809 Proteinuria, unspecified: Secondary | ICD-10-CM | POA: Diagnosis not present

## 2022-09-23 DIAGNOSIS — I10 Essential (primary) hypertension: Secondary | ICD-10-CM | POA: Diagnosis not present

## 2022-09-28 DIAGNOSIS — E1140 Type 2 diabetes mellitus with diabetic neuropathy, unspecified: Secondary | ICD-10-CM | POA: Diagnosis not present

## 2022-09-28 DIAGNOSIS — M79671 Pain in right foot: Secondary | ICD-10-CM | POA: Diagnosis not present

## 2022-09-28 DIAGNOSIS — M79672 Pain in left foot: Secondary | ICD-10-CM | POA: Diagnosis not present

## 2022-09-28 DIAGNOSIS — I739 Peripheral vascular disease, unspecified: Secondary | ICD-10-CM | POA: Diagnosis not present

## 2022-10-27 DIAGNOSIS — R21 Rash and other nonspecific skin eruption: Secondary | ICD-10-CM | POA: Diagnosis not present

## 2022-10-28 ENCOUNTER — Other Ambulatory Visit: Payer: Self-pay

## 2022-10-28 ENCOUNTER — Encounter (HOSPITAL_COMMUNITY): Payer: Self-pay

## 2022-10-28 ENCOUNTER — Emergency Department (HOSPITAL_COMMUNITY): Payer: Medicare HMO

## 2022-10-28 ENCOUNTER — Emergency Department (HOSPITAL_COMMUNITY)
Admission: EM | Admit: 2022-10-28 | Discharge: 2022-10-29 | Disposition: A | Discharge status: Home / Self Care | Payer: Medicare HMO | Attending: Emergency Medicine | Admitting: Emergency Medicine

## 2022-10-28 DIAGNOSIS — R079 Chest pain, unspecified: Secondary | ICD-10-CM | POA: Diagnosis present

## 2022-10-28 DIAGNOSIS — I700 Atherosclerosis of aorta: Secondary | ICD-10-CM | POA: Diagnosis not present

## 2022-10-28 DIAGNOSIS — Z1152 Encounter for screening for COVID-19: Secondary | ICD-10-CM | POA: Diagnosis not present

## 2022-10-28 DIAGNOSIS — Z7982 Long term (current) use of aspirin: Secondary | ICD-10-CM | POA: Diagnosis not present

## 2022-10-28 DIAGNOSIS — R0789 Other chest pain: Secondary | ICD-10-CM | POA: Diagnosis not present

## 2022-10-28 DIAGNOSIS — Z79899 Other long term (current) drug therapy: Secondary | ICD-10-CM | POA: Insufficient documentation

## 2022-10-28 DIAGNOSIS — R072 Precordial pain: Secondary | ICD-10-CM | POA: Diagnosis not present

## 2022-10-28 DIAGNOSIS — I10 Essential (primary) hypertension: Secondary | ICD-10-CM | POA: Insufficient documentation

## 2022-10-28 LAB — BASIC METABOLIC PANEL
Anion gap: 6 (ref 5–15)
BUN: 25 mg/dL — ABNORMAL HIGH (ref 8–23)
CO2: 28 mmol/L (ref 22–32)
Calcium: 8.4 mg/dL — ABNORMAL LOW (ref 8.9–10.3)
Chloride: 103 mmol/L (ref 98–111)
Creatinine, Ser: 1.09 mg/dL (ref 0.61–1.24)
GFR, Estimated: >60 mL/min (ref >60)
Glucose, Bld: 176 mg/dL — ABNORMAL HIGH (ref 70–99)
Potassium: 3.7 mmol/L (ref 3.5–5.1)
Sodium: 137 mmol/L (ref 135–145)

## 2022-10-28 LAB — CBC
HCT: 37.7 % — ABNORMAL LOW (ref 39.0–52.0)
Hemoglobin: 12.3 g/dL — ABNORMAL LOW (ref 13.0–17.0)
MCH: 29 pg (ref 26.0–34.0)
MCHC: 32.6 g/dL (ref 30.0–36.0)
MCV: 88.9 fL (ref 80.0–100.0)
Platelets: 144 10*3/uL — ABNORMAL LOW (ref 150–400)
RBC: 4.24 MIL/uL (ref 4.22–5.81)
RDW: 14 % (ref 11.5–15.5)
WBC: 8.4 10*3/uL (ref 4.0–10.5)
nRBC: 0 % (ref 0.0–0.2)

## 2022-10-28 LAB — RESP PANEL BY RT-PCR (RSV, FLU A&B, COVID)  RVPGX2
Influenza A by PCR: NEGATIVE
Influenza B by PCR: NEGATIVE
Resp Syncytial Virus by PCR: NEGATIVE
SARS Coronavirus 2 by RT PCR: NEGATIVE

## 2022-10-28 LAB — TROPONIN I (HIGH SENSITIVITY): Troponin I (High Sensitivity): 10 ng/L (ref <18)

## 2022-10-28 MED ORDER — CLONIDINE HCL 0.2 MG PO TABS
0.2000 mg | ORAL_TABLET | Freq: Once | ORAL | Status: AC
  Administered 2022-10-28: 0.2 mg via ORAL
  Filled 2022-10-28: qty 1

## 2022-10-28 NOTE — ED Notes (Signed)
Patient transported to X-ray 

## 2022-10-28 NOTE — ED Triage Notes (Signed)
C/o substernal cp radiating to left arm with headache, runny nose, sore throat, congestion x2 hours.  Relief of CP with ambulation.  Denies sob

## 2022-10-28 NOTE — ED Provider Notes (Signed)
[facility] EMERGENCY DEPARTMENT AT [facility] Provider Note   CSN: [id number] Arrival date & time: 10/28/22  2142     History {Add pertinent medical, surgical, social history, OB history to HPI:1} Chief Complaint  Patient presents with   Chest Pain    [facility] [name]. is a 77 y.o. [name].  Patient presents to the emergency department for evaluation of chest pain.  He reports that he first felt some discomfort around 2 PM today.  Pain was in the center of his chest and was slight, waxing and waning.  Around 9 PM, however, he had intensification of his pain, started to experience radiation to the left arm.  Patient noted to be hypertensive in triage, reports a history of fairly well-controlled hypertension.  He took his morning medications late today but has not missed any doses.  He does report that he has been experiencing headache over the last 3 days.  Headache is not severe or sudden onset, generally gets better if he gets up and moves around.       Home Medications Prior to Admission medications   Medication Sig Start Date End Date Taking? Authorizing Provider  Probiotic Product (ACIDOPHILUS HIGH-POTENCY) CAPS Take 1 capsule by mouth at bedtime. 09/10/22  Yes [provider]  alfuzosin (UROXATRAL) 10 MG 24 hr tablet 1 mg every day by oral route.    [provider]  amLODipine (NORVASC) Grant MG tablet Take 1 tablet (Grant mg total) by mouth daily. 02/04/21 Grant/17/23  Cantwell, Celeste C, PA-C  aspirin 325 MG tablet Take 650 mg by mouth every 4 (four) hours as needed.    [provider]  bismuth subsalicylate (PEPTO BISMOL) 262 MG/15ML suspension Take 30 mLs by mouth every 6 (six) hours as needed.    [provider]  chlorthalidone (HYGROTON) 25 MG tablet TAKE ONE TABLET BY MOUTH ONCE DAILY 03/23/22   Yates Decamp, MD  cilostazol (PLETAL) 50 MG tablet TAKE 1 TABLET BY MOUTH TWICE A DAY 11/28/18   Patwardhan, Manish J, MD  esomeprazole (NEXIUM) 40  MG capsule Take 40 mg by mouth daily. 04/14/19   [provider]  finasteride (PROSCAR) Grant MG tablet Take 1 tablet (Grant mg total) by mouth daily. 02/09/22   McKenzie, Mardene Celeste, MD  gabapentin (NEURONTIN) 300 MG capsule TAKE 1 CAPSULE BY MOUTH THREE TIMES A DAY AS NEEDED FOR NERVE PAIN    [provider]  glipiZIDE (GLUCOTROL) Grant MG tablet Take Grant mg by mouth 3 (three) times daily with meals. 03/09/19   [provider]  JARDIANCE 25 MG TABS tablet Take 25 mg by mouth daily. 07/02/21   [provider]  latanoprost (XALATAN) 0.005 % ophthalmic solution Place 1 drop into both eyes at bedtime. 01/07/21   [provider]  LORazepam (ATIVAN) 0.Grant MG tablet TAKE HALF TO 1 TABLET BY MOUTH EVERY 8 HOURS AS NEEDED FOR ANXIETY ,AGITATION, OR INSOMNIA    [provider]  losartan (COZAAR) 25 MG tablet Take 1 tablet (25 mg total) by mouth daily. 01/15/21 01/10/22  Cantwell, Celeste C, PA-C  metFORMIN (GLUCOPHAGE) 500 MG tablet Take 500 mg by mouth in the morning, at noon, and at bedtime. 02/24/18   [provider]  methocarbamol (ROBAXIN) 500 MG tablet Take 2 tablets 4 times a day by oral route for [place] days. 02/04/22   [provider]  Multiple Vitamins-Minerals (MULTIVITAMIN WITH MINERALS) tablet Take 1 tablet by mouth daily.    [provider]  ONETOUCH ULTRA test strip 1 each daily. 09/12/19   [provider]  pantoprazole (PROTONIX) 40 MG tablet Take 40 mg by mouth daily.    [provider]  rosuvastatin (CRESTOR) 20 MG tablet Take 1 tablet (20 mg total) by mouth daily. 05/09/18   Patwardhan, Anabel Bene, MD  silodosin (RAPAFLO) 8 MG CAPS capsule Take 1 capsule (8 mg total) by mouth 2 (two) times daily. 02/09/22   McKenzie, Mardene Celeste, MD  sulfamethoxazole-trimethoprim (BACTRIM DS) 800-160 MG tablet Take 1 tablet by mouth every 12 (twelve) hours. 07/28/21   McKenzie, Mardene Celeste, MD  TRESIBA FLEXTOUCH 100 UNIT/ML FlexTouch Pen Inject  into the skin. 07/07/21   [provider]      Allergies    Morphine, Other, Augmentin [amoxicillin-pot clavulanate], Hydrocodone, and Oxycontin [oxycodone hcl]    Review of Systems   Review of Systems  Physical Exam Updated Vital Signs BP (!) 218/77 (BP Location: Left Arm)   Pulse 78   Temp 99.8 F (37.7 C) (Oral)   Resp 18   Wt 90.7 kg   SpO2 95%   BMI 27.89 kg/m  Physical Exam Vitals and nursing note reviewed.  Constitutional:      General: He is not in acute distress.    Appearance: He is well-developed.  HENT:     Head: Normocephalic and atraumatic.     Mouth/Throat:     Mouth: Mucous membranes are moist.  Eyes:     General: Vision grossly intact. Gaze aligned appropriately.     Extraocular Movements: Extraocular movements intact.     Conjunctiva/sclera: Conjunctivae normal.  Cardiovascular:     Rate and Rhythm: Normal rate and regular rhythm.     Pulses: Normal pulses.     Heart sounds: Normal heart sounds, S1 normal and S2 normal. No murmur heard.    No friction rub. No gallop.  Pulmonary:     Effort: Pulmonary effort is normal. No respiratory distress.     Breath sounds: Normal breath sounds.  Abdominal:     Palpations: Abdomen is soft.     Tenderness: There is no abdominal tenderness. There is no guarding or rebound.     Hernia: No hernia is present.  Musculoskeletal:        General: No swelling.     Cervical back: Full passive range of motion without pain, normal range of motion and neck supple. No pain with movement, spinous process tenderness or muscular tenderness. Normal range of motion.     Right lower leg: No edema.     Left lower leg: No edema.  Skin:    General: Skin is warm and dry.     Capillary Refill: Capillary refill takes less than 2 seconds.     Findings: No ecchymosis, erythema, lesion or wound.  Neurological:     Mental Status: He is alert and oriented to person, place, and time.     GCS: GCS eye subscore is 4. GCS verbal  subscore is Grant. GCS motor subscore is 6.     Cranial Nerves: Cranial nerves 2-12 are intact.     Sensory: Sensation is intact.     Motor: Motor function is intact. No weakness or abnormal muscle tone.     Coordination: Coordination is intact.  Psychiatric:        Mood and Affect: Mood normal.        Speech: Speech normal.        Behavior: Behavior normal.     ED Results /  Procedures / Treatments   Labs (all labs ordered are listed, but only abnormal results are displayed) Labs Reviewed  BASIC METABOLIC PANEL - Abnormal; Notable for the following components:      Result Value   Glucose, Bld 176 (*)    BUN 25 (*)    Calcium 8.4 (*)    All other components within normal limits  CBC - Abnormal; Notable for the following components:   Hemoglobin 12.3 (*)    HCT 37.7 (*)    Platelets 144 (*)    All other components within normal limits  RESP PANEL BY RT-PCR (RSV, FLU A&B, COVID)  [id number]  TROPONIN I (HIGH SENSITIVITY)    EKG None  Radiology No results found.  Procedures Procedures  {Document cardiac monitor, telemetry assessment procedure when appropriate:1}  Medications Ordered in ED Medications  cloNIDine (CATAPRES) tablet 0.2 mg (has no administration in time range)    ED Course/ Medical Decision Making/ A&P   {   Click here for ABCD2, HEART and other calculatorsREFRESH Note before signing :1}                              Medical Decision Making Amount and/or Complexity of Data Reviewed Labs: ordered. Radiology: ordered.  Risk Prescription drug management.   ***  {Document critical care time when appropriate:1} {Document review of labs and clinical decision tools ie heart score, Chads2Vasc2 etc:1}  {Document your independent review of radiology images, and any outside records:1} {Document your discussion with family members, caretakers, and with consultants:1} {Document social determinants of health affecting pt's care:1} {Document your decision making  why or why not admission, treatments were needed:1} Final Clinical Impression(s) / ED Diagnoses Final diagnoses:  None    Rx / DC Orders ED Discharge Orders     None

## 2022-10-28 NOTE — ED Provider Triage Note (Signed)
Emergency Medicine Provider Triage Evaluation Note  Grant [name]. , a 77 y.o. male  was evaluated in triage.  Pt complains of chest pain. He states that same began gradually about 2 hours prior to arrival, feels like pressure and radiates into his left arm. Denies any history of similar symptoms previously. Does note some exertional dyspnea as well. States he's taking his medications as prescribed, never had blood pressure of over 200 before. Does not check bp at home. Pain rated 2/10  Review of Systems  Positive:  Negative:   Physical Exam  BP (!) 205/84 (BP Location: Right Arm)   Pulse 79   Temp 99.8 F (37.7 C) (Oral)   Resp (!) 22   Wt 90.7 kg   SpO2 93%   BMI 27.89 kg/m  Gen:   Awake, no distress   Resp:  Normal effort  MSK:   Moves extremities without difficulty  Other:  Radial, DP and PT pulses intact and 2+. Heart sounds without murmur.  Medical Decision Making  Medically screening exam initiated at 10:55 PM.  Appropriate orders placed.  [facility]. was informed that the remainder of the evaluation will be completed by another provider, this initial triage assessment does not replace that evaluation, and the importance of remaining in the ED until their evaluation is complete.     Silva Bandy, PA-C 10/28/22 2259

## 2022-10-29 LAB — TROPONIN I (HIGH SENSITIVITY): Troponin I (High Sensitivity): 16 ng/L (ref <18)

## 2022-10-29 MED ORDER — AMLODIPINE BESYLATE 5 MG PO TABS
5.0000 mg | ORAL_TABLET | Freq: Once | ORAL | Status: AC
  Administered 2022-10-29: 5 mg via ORAL
  Filled 2022-10-29: qty 1

## 2022-11-02 DIAGNOSIS — Z713 Dietary counseling and surveillance: Secondary | ICD-10-CM | POA: Diagnosis not present

## 2022-11-02 DIAGNOSIS — Z7182 Exercise counseling: Secondary | ICD-10-CM | POA: Diagnosis not present

## 2022-11-02 DIAGNOSIS — Z6829 Body mass index (BMI) 29.0-29.9, adult: Secondary | ICD-10-CM | POA: Diagnosis not present

## 2022-11-02 DIAGNOSIS — R0789 Other chest pain: Secondary | ICD-10-CM | POA: Diagnosis not present

## 2022-11-02 DIAGNOSIS — E669 Obesity, unspecified: Secondary | ICD-10-CM | POA: Diagnosis not present

## 2022-11-02 DIAGNOSIS — I10 Essential (primary) hypertension: Secondary | ICD-10-CM | POA: Diagnosis not present

## 2022-11-02 DIAGNOSIS — Z79899 Other long term (current) drug therapy: Secondary | ICD-10-CM | POA: Diagnosis not present

## 2022-11-06 ENCOUNTER — Other Ambulatory Visit: Payer: Self-pay

## 2022-11-06 DIAGNOSIS — N138 Other obstructive and reflux uropathy: Secondary | ICD-10-CM

## 2022-11-06 MED ORDER — FINASTERIDE 5 MG PO TABS: 5.0000 mg | ORAL_TABLET | Freq: Every day | ORAL | 1 refills | Status: DC

## 2022-11-06 MED ORDER — SILODOSIN 8 MG PO CAPS: 8.0000 mg | ORAL_CAPSULE | Freq: Two times a day (BID) | ORAL | 5 refills | Status: DC

## 2022-11-06 NOTE — Progress Notes (Signed)
Patient changed to [facility] and requested rx's be sent there.  Rx reordered and sent to pharmacy

## 2022-11-10 DIAGNOSIS — R21 Rash and other nonspecific skin eruption: Secondary | ICD-10-CM | POA: Diagnosis not present

## 2022-11-25 DIAGNOSIS — Z7182 Exercise counseling: Secondary | ICD-10-CM | POA: Diagnosis not present

## 2022-11-25 DIAGNOSIS — Z713 Dietary counseling and surveillance: Secondary | ICD-10-CM | POA: Diagnosis not present

## 2022-11-25 DIAGNOSIS — I10 Essential (primary) hypertension: Secondary | ICD-10-CM | POA: Diagnosis not present

## 2022-11-25 DIAGNOSIS — Z6829 Body mass index (BMI) 29.0-29.9, adult: Secondary | ICD-10-CM | POA: Diagnosis not present

## 2022-11-25 DIAGNOSIS — E669 Obesity, unspecified: Secondary | ICD-10-CM | POA: Diagnosis not present

## 2022-11-25 DIAGNOSIS — Z79899 Other long term (current) drug therapy: Secondary | ICD-10-CM | POA: Diagnosis not present

## 2022-12-07 DIAGNOSIS — I739 Peripheral vascular disease, unspecified: Secondary | ICD-10-CM | POA: Diagnosis not present

## 2022-12-07 DIAGNOSIS — E1140 Type 2 diabetes mellitus with diabetic neuropathy, unspecified: Secondary | ICD-10-CM | POA: Diagnosis not present

## 2022-12-07 DIAGNOSIS — M79672 Pain in left foot: Secondary | ICD-10-CM | POA: Diagnosis not present

## 2022-12-07 DIAGNOSIS — M79671 Pain in right foot: Secondary | ICD-10-CM | POA: Diagnosis not present

## 2023-01-19 DIAGNOSIS — E1169 Type 2 diabetes mellitus with other specified complication: Secondary | ICD-10-CM | POA: Diagnosis not present

## 2023-01-19 DIAGNOSIS — I10 Essential (primary) hypertension: Secondary | ICD-10-CM | POA: Diagnosis not present

## 2023-01-25 DIAGNOSIS — H409 Unspecified glaucoma: Secondary | ICD-10-CM | POA: Diagnosis not present

## 2023-01-25 DIAGNOSIS — D638 Anemia in other chronic diseases classified elsewhere: Secondary | ICD-10-CM | POA: Diagnosis not present

## 2023-01-25 DIAGNOSIS — R809 Proteinuria, unspecified: Secondary | ICD-10-CM | POA: Diagnosis not present

## 2023-01-25 DIAGNOSIS — Z8546 Personal history of malignant neoplasm of prostate: Secondary | ICD-10-CM | POA: Diagnosis not present

## 2023-01-25 DIAGNOSIS — K219 Gastro-esophageal reflux disease without esophagitis: Secondary | ICD-10-CM | POA: Diagnosis not present

## 2023-01-25 DIAGNOSIS — E1169 Type 2 diabetes mellitus with other specified complication: Secondary | ICD-10-CM | POA: Diagnosis not present

## 2023-01-25 DIAGNOSIS — E782 Mixed hyperlipidemia: Secondary | ICD-10-CM | POA: Diagnosis not present

## 2023-01-25 DIAGNOSIS — I10 Essential (primary) hypertension: Secondary | ICD-10-CM | POA: Diagnosis not present

## 2023-01-25 DIAGNOSIS — E1151 Type 2 diabetes mellitus with diabetic peripheral angiopathy without gangrene: Secondary | ICD-10-CM | POA: Diagnosis not present

## 2023-01-25 DIAGNOSIS — N401 Enlarged prostate with lower urinary tract symptoms: Secondary | ICD-10-CM | POA: Diagnosis not present

## 2023-01-25 DIAGNOSIS — M4802 Spinal stenosis, cervical region: Secondary | ICD-10-CM | POA: Diagnosis not present

## 2023-01-25 DIAGNOSIS — I700 Atherosclerosis of aorta: Secondary | ICD-10-CM | POA: Diagnosis not present

## 2023-02-05 ENCOUNTER — Other Ambulatory Visit: Payer: Medicare HMO

## 2023-02-05 DIAGNOSIS — C61 Malignant neoplasm of prostate: Secondary | ICD-10-CM

## 2023-02-06 LAB — PSA: Prostate Specific Ag, Serum: 4.7 ng/mL — ABNORMAL HIGH (ref 0.0–4.0)

## 2023-02-09 ENCOUNTER — Telehealth: Payer: Self-pay

## 2023-02-09 NOTE — Telephone Encounter (Signed)
PA started for silodosin 8mg  cap-  Will be sent to phamacist for review- [id number]

## 2023-02-12 ENCOUNTER — Ambulatory Visit: Payer: Medicare HMO | Admitting: Urology

## 2023-02-12 VITALS — BP 138/75 | HR 91

## 2023-02-12 DIAGNOSIS — N401 Enlarged prostate with lower urinary tract symptoms: Secondary | ICD-10-CM

## 2023-02-12 DIAGNOSIS — C61 Malignant neoplasm of prostate: Secondary | ICD-10-CM | POA: Diagnosis not present

## 2023-02-12 DIAGNOSIS — N138 Other obstructive and reflux uropathy: Secondary | ICD-10-CM | POA: Diagnosis not present

## 2023-02-12 DIAGNOSIS — R339 Retention of urine, unspecified: Secondary | ICD-10-CM

## 2023-02-12 LAB — BLADDER SCAN AMB NON-IMAGING: Scan Result: 125

## 2023-02-12 MED ORDER — SILODOSIN 8 MG PO CAPS
8.0000 mg | ORAL_CAPSULE | Freq: Two times a day (BID) | ORAL | 5 refills | Status: DC
Start: 2023-02-12 — End: 2023-06-17

## 2023-02-12 MED ORDER — FINASTERIDE 5 MG PO TABS: 5.0000 mg | ORAL_TABLET | Freq: Every day | ORAL | 3 refills | Status: DC

## 2023-02-12 NOTE — Progress Notes (Unsigned)
post void residual=125

## 2023-02-12 NOTE — Progress Notes (Unsigned)
02/12/2023 9:56 AM   Carmelina Paddock [place]. July 23, 1945 [id number]  Referring provider: Benita Stabile, MD [address] [place],  [place] [address]  Followup prostate cancer and BPH   HPI: Mr Lubeck is a 77yo here for followup for prostate cancer and BPH. PSA decreased to 4.7. IPSS 21 QOL 3 on rapalfo 8mg  BID and finasteride. He has nocturia 3-4x. He has an intermittent weak urinary stream. He has intermittent dribbling   PMH: Past Medical History:  Diagnosis Date   Cancer [facility])    prostate   Cervical stenosis of spine    Claudication (HCC)    Claudication (HCC)    Diabetes mellitus without complication (HCC)    GERD (gastroesophageal reflux disease)    Glaucoma (increased eye pressure)    Hyperlipidemia    Hypertension    Wears glasses     Surgical History: Past Surgical History:  Procedure Laterality Date   fracture back     HERNIA REPAIR     POSTERIOR CERVICAL FUSION/FORAMINOTOMY N/A 09/10/2017   Procedure: LAMINECTOMY CERVICAL FOUR - CERVICAL FIVE, CERVICAL FIVE - CERVICAL SIX, CERVICAL SIX - CERVICAL SEVEN, POSTERIOR SEGMENTAL INSTRUMENTATION, POSTERIOR LATERAL ARTHRODESIS;  Surgeon: Lisbeth Renshaw, MD;  Location: [facility] OR;  Service: Neurosurgery;  Laterality: N/A;  LAMINECTOMY CERVICAL FOUR - CERVICAL FIVE, CERVICAL FIVE - CERVICAL SIX, CERVICAL SIX - CERVICAL SEVEN, POSTERIOR SEGMENTAL    Home Medications:  Allergies as of 02/12/2023       Reactions   Morphine Other (See Comments)   Other Other (See Comments)   Augmentin [amoxicillin-pot Clavulanate] Nausea And Vomiting   Hydrocodone Other (See Comments)   " I get goofy and crazy thoughts. "   Oxycontin [oxycodone Hcl] Other (See Comments)   " I couldn't stay still; I was walking around in circles " Pt has tolerated Percocet this medication 08/2017 admssion        Medication List        Accurate as of February 12, 2023  9:56 AM. If you have any questions, ask your nurse or doctor.           Acidophilus High-Potency Caps Take 1 capsule by mouth at bedtime.   alfuzosin 10 MG 24 hr tablet Commonly known as: UROXATRAL 1 mg every day by oral route.   amLODipine 5 MG tablet Commonly known as: NORVASC Take 1 tablet (5 mg total) by mouth daily.   aspirin 325 MG tablet Take 650 mg by mouth every 4 (four) hours as needed.   bismuth subsalicylate 262 MG/15ML suspension Commonly known as: PEPTO BISMOL Take 30 mLs by mouth every 6 (six) hours as needed.   chlorthalidone 25 MG tablet Commonly known as: HYGROTON TAKE ONE TABLET BY MOUTH ONCE DAILY   cilostazol 50 MG tablet Commonly known as: PLETAL TAKE 1 TABLET BY MOUTH TWICE A DAY   esomeprazole 40 MG capsule Commonly known as: NEXIUM Take 40 mg by mouth daily.   finasteride 5 MG tablet Commonly known as: PROSCAR Take 1 tablet (5 mg total) by mouth daily.   gabapentin 300 MG capsule Commonly known as: NEURONTIN TAKE 1 CAPSULE BY MOUTH THREE TIMES A DAY AS NEEDED FOR NERVE PAIN   glipiZIDE 5 MG tablet Commonly known as: GLUCOTROL Take 5 mg by mouth 3 (three) times daily with meals.   Jardiance 25 MG Tabs tablet Generic drug: empagliflozin Take 25 mg by mouth daily.   latanoprost 0.005 % ophthalmic solution Commonly known as: XALATAN Place 1 drop into  both eyes at bedtime.   LORazepam 0.5 MG tablet Commonly known as: ATIVAN TAKE HALF TO 1 TABLET BY MOUTH EVERY 8 HOURS AS NEEDED FOR ANXIETY ,AGITATION, OR INSOMNIA   losartan 25 MG tablet Commonly known as: COZAAR Take 1 tablet (25 mg total) by mouth daily.   metFORMIN 500 MG tablet Commonly known as: GLUCOPHAGE Take 500 mg by mouth in the morning, at noon, and at bedtime.   methocarbamol 500 MG tablet Commonly known as: ROBAXIN Take 2 tablets 4 times a day by oral route for 5 days.   multivitamin with minerals tablet Take 1 tablet by mouth daily.   OneTouch Ultra test strip Generic drug: glucose blood 1 each daily.   pantoprazole 40 MG  tablet Commonly known as: PROTONIX Take 40 mg by mouth daily.   rosuvastatin 20 MG tablet Commonly known as: CRESTOR Take 1 tablet (20 mg total) by mouth daily.   silodosin 8 MG Caps capsule Commonly known as: RAPAFLO Take 1 capsule (8 mg total) by mouth 2 (two) times daily.   sulfamethoxazole-trimethoprim 800-160 MG tablet Commonly known as: BACTRIM DS Take 1 tablet by mouth every 12 (twelve) hours.   Evaristo Bury FlexTouch 100 UNIT/ML FlexTouch Pen Generic drug: insulin degludec Inject into the skin.        Allergies:  Allergies  Allergen Reactions   Morphine Other (See Comments)   Other Other (See Comments)   Augmentin [Amoxicillin-Pot Clavulanate] Nausea And Vomiting   Hydrocodone Other (See Comments)    " I get goofy and crazy thoughts. "   Oxycontin [Oxycodone Hcl] Other (See Comments)    " I couldn't stay still; I was walking around in circles " Pt has tolerated Percocet this medication 08/2017 admssion    Family History: Family History  Problem Relation Age of Onset   Diabetes Mother    Lupus Mother    Prostate cancer Father     Social History:  reports that he quit smoking about 8 years ago. His smoking use included cigarettes. He has never used smokeless tobacco. He reports current alcohol use. He reports that he does not use drugs.  ROS: All other review of systems were reviewed and are negative except what is noted above in HPI  Physical Exam: BP 138/75   Pulse 91   Constitutional:  Alert and oriented, No acute distress. HEENT: [place] AT, moist mucus membranes.  Trachea midline, no masses. Cardiovascular: No clubbing, cyanosis, or edema. Respiratory: Normal respiratory effort, no increased work of breathing. GI: Abdomen is soft, nontender, nondistended, no abdominal masses GU: No CVA tenderness.  Lymph: No cervical or inguinal lymphadenopathy. Skin: No rashes, bruises or suspicious lesions. Neurologic: Grossly intact, no focal deficits, moving all 4  extremities. Psychiatric: Normal mood and affect.  Laboratory Data: Lab Results  Component Value Date   WBC 8.4 10/28/2022   HGB 12.3 (L) 10/28/2022   HCT 37.7 (L) 10/28/2022   MCV 88.9 10/28/2022   PLT 144 (L) 10/28/2022    Lab Results  Component Value Date   CREATININE 1.09 10/28/2022    No results found for: "PSA"  No results found for: "TESTOSTERONE"  No results found for: "HGBA1C"  Urinalysis    Component Value Date/Time   APPEARANCEUR Clear 02/09/2022 0842   GLUCOSEU Negative 02/09/2022 0842   BILIRUBINUR Negative 02/09/2022 0842   PROTEINUR Negative 02/09/2022 0842   NITRITE Negative 02/09/2022 0842   LEUKOCYTESUR Negative 02/09/2022 0842    Lab Results  Component Value Date   LABMICR Comment  02/09/2022   WBCUA >30 (A) 07/28/2021   LABEPIT 0-10 07/28/2021   MUCUS Present 07/28/2021   BACTERIA Few 07/28/2021    Pertinent Imaging:  No results found for this or any previous visit.  No results found for this or any previous visit.  No results found for this or any previous visit.  No results found for this or any previous visit.  No results found for this or any previous visit.  No results found for this or any previous visit.  No results found for this or any previous visit.  No results found for this or any previous visit.   Assessment & Plan:    1. Malignant neoplasm of prostate (HCC) (Primary) Followup 6 months with PSA - Urinalysis, Routine w reflex microscopic  2. Incomplete emptying of bladder Continue rapaflo 8mg  BID and finasteride - BLADDER SCAN AMB NON-IMAGING  3. Benign prostatic hyperplasia with urinary obstruction Continue rapaflo 8mg  BID and finasteride    No follow-ups on file.  Wilkie Aye, MD  [facility] [place]

## 2023-02-15 DIAGNOSIS — M79675 Pain in left toe(s): Secondary | ICD-10-CM | POA: Diagnosis not present

## 2023-02-15 DIAGNOSIS — E1140 Type 2 diabetes mellitus with diabetic neuropathy, unspecified: Secondary | ICD-10-CM | POA: Diagnosis not present

## 2023-02-15 DIAGNOSIS — M79672 Pain in left foot: Secondary | ICD-10-CM | POA: Diagnosis not present

## 2023-02-15 DIAGNOSIS — M79674 Pain in right toe(s): Secondary | ICD-10-CM | POA: Diagnosis not present

## 2023-02-15 DIAGNOSIS — I739 Peripheral vascular disease, unspecified: Secondary | ICD-10-CM | POA: Diagnosis not present

## 2023-02-15 DIAGNOSIS — M79671 Pain in right foot: Secondary | ICD-10-CM | POA: Diagnosis not present

## 2023-02-16 ENCOUNTER — Encounter: Payer: Self-pay | Admitting: General Surgery

## 2023-02-16 ENCOUNTER — Other Ambulatory Visit: Payer: Self-pay | Admitting: *Deleted

## 2023-02-16 ENCOUNTER — Encounter: Payer: Self-pay | Admitting: Urology

## 2023-02-16 ENCOUNTER — Ambulatory Visit: Payer: Medicare HMO | Admitting: General Surgery

## 2023-02-16 VITALS — BP 147/68 | HR 90 | Temp 97.8°F | Resp 14 | Ht 71.0 in | Wt 220.0 lb

## 2023-02-16 DIAGNOSIS — K429 Umbilical hernia without obstruction or gangrene: Secondary | ICD-10-CM

## 2023-02-16 NOTE — Patient Instructions (Signed)

## 2023-02-17 NOTE — Addendum Note (Signed)
Addended by: Phillips Odor on: 02/17/2023 09:49 AM   Modules accepted: Orders

## 2023-02-17 NOTE — Progress Notes (Signed)
Grant [name].; [id number]; 09-Apr-1945   HPI Patient is a 78 year old white male who was referred to my care by Dr. Nita Sells for evaluation treatment of an umbilical hernia.  He has had an umbilical hernia for some time, but recently has increased in size and is causing him discomfort.  It always sticks out.  It is made worse with straining.  No nausea or vomiting have been noted. Past Medical History:  Diagnosis Date   Cancer [facility])    prostate   Cervical stenosis of spine    Claudication (HCC)    Claudication (HCC)    Diabetes mellitus without complication (HCC)    GERD (gastroesophageal reflux disease)    Glaucoma (increased eye pressure)    Hyperlipidemia    Hypertension    Wears glasses     Past Surgical History:  Procedure Laterality Date   fracture back     HERNIA REPAIR     POSTERIOR CERVICAL FUSION/FORAMINOTOMY N/A 09/10/2017   Procedure: LAMINECTOMY CERVICAL FOUR - CERVICAL FIVE, CERVICAL FIVE - CERVICAL SIX, CERVICAL SIX - CERVICAL SEVEN, POSTERIOR SEGMENTAL INSTRUMENTATION, POSTERIOR LATERAL ARTHRODESIS;  Surgeon: Lisbeth Renshaw, MD;  Location: [facility] OR;  Service: Neurosurgery;  Laterality: N/A;  LAMINECTOMY CERVICAL FOUR - CERVICAL FIVE, CERVICAL FIVE - CERVICAL SIX, CERVICAL SIX - CERVICAL SEVEN, POSTERIOR SEGMENTAL    Family History  Problem Relation Age of Onset   Diabetes Mother    Lupus Mother    Prostate cancer Father     Current Outpatient Medications on File Prior to Visit  Medication Sig Dispense Refill   alfuzosin (UROXATRAL) 10 MG 24 hr tablet 1 mg every day by oral route.     amLODipine (NORVASC) 5 MG tablet Take 1 tablet (5 mg total) by mouth daily. 30 tablet 3   bismuth subsalicylate (PEPTO BISMOL) 262 MG/15ML suspension Take 30 mLs by mouth every 6 (six) hours as needed.     chlorthalidone (HYGROTON) 25 MG tablet TAKE ONE TABLET BY MOUTH ONCE DAILY 30 tablet 5   cilostazol (PLETAL) 50 MG tablet TAKE 1 TABLET BY MOUTH TWICE A DAY 180 tablet  1   esomeprazole (NEXIUM) 40 MG capsule Take 40 mg by mouth daily.     finasteride (PROSCAR) 5 MG tablet Take 1 tablet (5 mg total) by mouth daily. 90 tablet 3   gabapentin (NEURONTIN) 300 MG capsule TAKE 1 CAPSULE BY MOUTH THREE TIMES A DAY AS NEEDED FOR NERVE PAIN     latanoprost (XALATAN) 0.005 % ophthalmic solution Place 1 drop into both eyes at bedtime.     LORazepam (ATIVAN) 0.5 MG tablet TAKE HALF TO 1 TABLET BY MOUTH EVERY 8 HOURS AS NEEDED FOR ANXIETY ,AGITATION, OR INSOMNIA     losartan (COZAAR) 25 MG tablet Take 1 tablet (25 mg total) by mouth daily. 90 tablet 3   Multiple Vitamins-Minerals (MULTIVITAMIN WITH MINERALS) tablet Take 1 tablet by mouth daily.     ONETOUCH ULTRA test strip 1 each daily.     pantoprazole (PROTONIX) 40 MG tablet Take 40 mg by mouth daily.     Probiotic Product (ACIDOPHILUS HIGH-POTENCY) CAPS Take 1 capsule by mouth at bedtime.     rosuvastatin (CRESTOR) 20 MG tablet Take 1 tablet (20 mg total) by mouth daily. 90 tablet 3   silodosin (RAPAFLO) 8 MG CAPS capsule Take 1 capsule (8 mg total) by mouth 2 (two) times daily. 60 capsule 5   TRESIBA FLEXTOUCH 100 UNIT/ML FlexTouch Pen Inject into the skin.  No current facility-administered medications on file prior to visit.    Allergies  Allergen Reactions   Morphine Other (See Comments)   Other Other (See Comments)   Augmentin [Amoxicillin-Pot Clavulanate] Nausea And Vomiting   Hydrocodone Other (See Comments)    " I get goofy and crazy thoughts. "   Oxycontin [Oxycodone Hcl] Other (See Comments)    " I couldn't stay still; I was walking around in circles " Pt has tolerated Percocet this medication 08/2017 admssion    Social History   Substance and Sexual Activity  Alcohol Use Yes   Comment: occ    Social History   Tobacco Use  Smoking Status Former   Current packs/day: 0.00   Types: Cigarettes   Quit date: 03/21/2014   Years since quitting: 8.9  Smokeless Tobacco Never    Review of  Systems  Constitutional: Negative.   HENT: Negative.    Eyes: Negative.   Respiratory: Negative.    Cardiovascular: Negative.   Gastrointestinal: Negative.   Genitourinary:  Positive for frequency.  Musculoskeletal: Negative.   Skin: Negative.   Neurological: Negative.   Endo/Heme/Allergies: Negative.   Psychiatric/Behavioral: Negative.      Objective   Vitals:   02/16/23 1438  BP: (!) 147/68  Pulse: 90  Resp: 14  Temp: 97.8 F (36.6 C)  SpO2: 94%    Physical Exam Vitals reviewed.  Constitutional:      Appearance: Normal appearance. He is not ill-appearing.  HENT:     Head: Normocephalic and atraumatic.  Cardiovascular:     Rate and Rhythm: Normal rate and regular rhythm.     Heart sounds: Normal heart sounds. No murmur heard.    No friction rub. No gallop.  Pulmonary:     Effort: Pulmonary effort is normal. No respiratory distress.     Breath sounds: Normal breath sounds. No stridor. No wheezing, rhonchi or rales.  Abdominal:     General: Bowel sounds are normal. There is no distension.     Palpations: Abdomen is soft. There is no mass.     Tenderness: There is no abdominal tenderness. There is no guarding or rebound.     Hernia: A hernia is present.     Comments: A greater than 5 cm reducible umbilical hernia is present.  Skin:    General: Skin is warm and dry.  Neurological:     Mental Status: He is alert and oriented to person, place, and time.     Assessment  Umbilical hernia Plan  Patient will be scheduled for robotic assisted laparoscopic umbilical herniorrhaphy with mesh on 04/05/2023.  The risks and benefits of the procedure including bleeding, infection, mesh use, the possibility of recurrence, and the possibility of an open procedure were fully explained to the patient, who gave informed consent.

## 2023-03-17 NOTE — H&P (Signed)
 Grant [name].; [id number]; 09-Apr-1945   HPI Patient is a 78 year old white male who was referred to my care by Dr. Nita Sells for evaluation treatment of an umbilical hernia.  He has had an umbilical hernia for some time, but recently has increased in size and is causing him discomfort.  It always sticks out.  It is made worse with straining.  No nausea or vomiting have been noted. Past Medical History:  Diagnosis Date   Cancer [facility])    prostate   Cervical stenosis of spine    Claudication (HCC)    Claudication (HCC)    Diabetes mellitus without complication (HCC)    GERD (gastroesophageal reflux disease)    Glaucoma (increased eye pressure)    Hyperlipidemia    Hypertension    Wears glasses     Past Surgical History:  Procedure Laterality Date   fracture back     HERNIA REPAIR     POSTERIOR CERVICAL FUSION/FORAMINOTOMY N/A 09/10/2017   Procedure: LAMINECTOMY CERVICAL FOUR - CERVICAL FIVE, CERVICAL FIVE - CERVICAL SIX, CERVICAL SIX - CERVICAL SEVEN, POSTERIOR SEGMENTAL INSTRUMENTATION, POSTERIOR LATERAL ARTHRODESIS;  Surgeon: Lisbeth Renshaw, MD;  Location: [facility] OR;  Service: Neurosurgery;  Laterality: N/A;  LAMINECTOMY CERVICAL FOUR - CERVICAL FIVE, CERVICAL FIVE - CERVICAL SIX, CERVICAL SIX - CERVICAL SEVEN, POSTERIOR SEGMENTAL    Family History  Problem Relation Age of Onset   Diabetes Mother    Lupus Mother    Prostate cancer Father     Current Outpatient Medications on File Prior to Visit  Medication Sig Dispense Refill   alfuzosin (UROXATRAL) 10 MG 24 hr tablet 1 mg every day by oral route.     amLODipine (NORVASC) 5 MG tablet Take 1 tablet (5 mg total) by mouth daily. 30 tablet 3   bismuth subsalicylate (PEPTO BISMOL) 262 MG/15ML suspension Take 30 mLs by mouth every 6 (six) hours as needed.     chlorthalidone (HYGROTON) 25 MG tablet TAKE ONE TABLET BY MOUTH ONCE DAILY 30 tablet 5   cilostazol (PLETAL) 50 MG tablet TAKE 1 TABLET BY MOUTH TWICE A DAY 180 tablet  1   esomeprazole (NEXIUM) 40 MG capsule Take 40 mg by mouth daily.     finasteride (PROSCAR) 5 MG tablet Take 1 tablet (5 mg total) by mouth daily. 90 tablet 3   gabapentin (NEURONTIN) 300 MG capsule TAKE 1 CAPSULE BY MOUTH THREE TIMES A DAY AS NEEDED FOR NERVE PAIN     latanoprost (XALATAN) 0.005 % ophthalmic solution Place 1 drop into both eyes at bedtime.     LORazepam (ATIVAN) 0.5 MG tablet TAKE HALF TO 1 TABLET BY MOUTH EVERY 8 HOURS AS NEEDED FOR ANXIETY ,AGITATION, OR INSOMNIA     losartan (COZAAR) 25 MG tablet Take 1 tablet (25 mg total) by mouth daily. 90 tablet 3   Multiple Vitamins-Minerals (MULTIVITAMIN WITH MINERALS) tablet Take 1 tablet by mouth daily.     ONETOUCH ULTRA test strip 1 each daily.     pantoprazole (PROTONIX) 40 MG tablet Take 40 mg by mouth daily.     Probiotic Product (ACIDOPHILUS HIGH-POTENCY) CAPS Take 1 capsule by mouth at bedtime.     rosuvastatin (CRESTOR) 20 MG tablet Take 1 tablet (20 mg total) by mouth daily. 90 tablet 3   silodosin (RAPAFLO) 8 MG CAPS capsule Take 1 capsule (8 mg total) by mouth 2 (two) times daily. 60 capsule 5   TRESIBA FLEXTOUCH 100 UNIT/ML FlexTouch Pen Inject into the skin.  No current facility-administered medications on file prior to visit.    Allergies  Allergen Reactions   Morphine Other (See Comments)   Other Other (See Comments)   Augmentin [Amoxicillin-Pot Clavulanate] Nausea And Vomiting   Hydrocodone Other (See Comments)    " I get goofy and crazy thoughts. "   Oxycontin [Oxycodone Hcl] Other (See Comments)    " I couldn't stay still; I was walking around in circles " Pt has tolerated Percocet this medication 08/2017 admssion    Social History   Substance and Sexual Activity  Alcohol Use Yes   Comment: occ    Social History   Tobacco Use  Smoking Status Former   Current packs/day: 0.00   Types: Cigarettes   Quit date: 03/21/2014   Years since quitting: 8.9  Smokeless Tobacco Never    Review of  Systems  Constitutional: Negative.   HENT: Negative.    Eyes: Negative.   Respiratory: Negative.    Cardiovascular: Negative.   Gastrointestinal: Negative.   Genitourinary:  Positive for frequency.  Musculoskeletal: Negative.   Skin: Negative.   Neurological: Negative.   Endo/Heme/Allergies: Negative.   Psychiatric/Behavioral: Negative.      Objective   Vitals:   02/16/23 1438  BP: (!) 147/68  Pulse: 90  Resp: 14  Temp: 97.8 F (36.6 C)  SpO2: 94%    Physical Exam Vitals reviewed.  Constitutional:      Appearance: Normal appearance. He is not ill-appearing.  HENT:     Head: Normocephalic and atraumatic.  Cardiovascular:     Rate and Rhythm: Normal rate and regular rhythm.     Heart sounds: Normal heart sounds. No murmur heard.    No friction rub. No gallop.  Pulmonary:     Effort: Pulmonary effort is normal. No respiratory distress.     Breath sounds: Normal breath sounds. No stridor. No wheezing, rhonchi or rales.  Abdominal:     General: Bowel sounds are normal. There is no distension.     Palpations: Abdomen is soft. There is no mass.     Tenderness: There is no abdominal tenderness. There is no guarding or rebound.     Hernia: A hernia is present.     Comments: A greater than 5 cm reducible umbilical hernia is present.  Skin:    General: Skin is warm and dry.  Neurological:     Mental Status: He is alert and oriented to person, place, and time.     Assessment  Umbilical hernia Plan  Patient will be scheduled for robotic assisted laparoscopic umbilical herniorrhaphy with mesh on 04/05/2023.  The risks and benefits of the procedure including bleeding, infection, mesh use, the possibility of recurrence, and the possibility of an open procedure were fully explained to the patient, who gave informed consent.

## 2023-03-23 ENCOUNTER — Telehealth: Payer: Self-pay | Admitting: Urology

## 2023-03-23 NOTE — Telephone Encounter (Signed)
 Patient called check on referral ?

## 2023-03-23 NOTE — Telephone Encounter (Signed)
What referral?

## 2023-03-24 NOTE — Telephone Encounter (Signed)
 Grant [name].  Is this something you would handle or is there something we need to do.  Looks like the order is in.  Let me know, thanks!

## 2023-03-26 NOTE — Telephone Encounter (Signed)
 Spoke with patient, provided him with the number to IR.  He will reach out to schedule and call our office with any issues.

## 2023-03-26 NOTE — Telephone Encounter (Signed)
 It doesn't look like he's active on mychart anymore.

## 2023-03-31 NOTE — Patient Instructions (Signed)
 Grant [name] [place].  03/31/2023     @PREFPERIOPPHARMACY @   Your procedure is scheduled on  04/05/2023.   Report to [facility] at  0600  A.M.    Call this number if you have problems the morning of surgery:  [phone]  If you experience any cold or flu symptoms such as cough, fever, chills, shortness of breath, etc. between now and your scheduled surgery, please notify us at the above number.   Remember:         If you take your insulin at night, take 1/2 of your usual dose the night before your procedure.       DO NOT take any medications for diabetes the moring of your procedure.   Do not eat after midnight.   You may drink clear liquids until 0330 am on 04/05/2023.    Clear liquids allowed are:                    Water, Juice (No red color; non-citric and without pulp; diabetics please choose diet or no sugar options), Carbonated beverages (diabetics please choose diet or no sugar options), Clear Tea (No creamer, milk, or cream, including half & half and powdered creamer), Black Coffee Only (No creamer, milk or cream, including half & half and powdered creamer), and Clear Sports drink (No red color; diabetics please choose diet or no sugar options)    Take these medicines the morning of surgery with A SIP OF WATER        amlodipine,esomeprazole, finasteride, pantoprazole, silodosin.    Do not wear jewelry, make-up or nail polish, including gel polish,  artificial nails, or any other type of covering on natural nails (fingers and  toes).  Do not wear lotions, powders, or perfumes, or deodorant.  Do not shave 48 hours prior to surgery.  Men may shave face and neck.  Do not bring valuables to the hospital.  [facility] is not responsible for any belongings or valuables.  Contacts, dentures or bridgework may not be worn into surgery.  Leave your suitcase in the car.  After surgery it may be brought to your room.  For patients admitted to the hospital, discharge  time will be determined by your treatment team.  Patients discharged the day of surgery will not be allowed to drive home and must have someone with them for 24 hours.    Special instructions:   DO NOT smoke tobacco or vape for 24 hours.  Please read over the following fact sheets that you were given. Anesthesia Post-op Instructions and Care and Recovery After Surgery         Laparoscopic Surgery for Belly Hernias: What to Know After After the procedure, it's common to have pain, discomfort, or soreness. Follow these instructions at home: Medicines Take your medicines only as told. You may need to take steps to help treat or prevent trouble pooping (constipation), such as: Taking medicines to help you poop. Eating foods high in fiber, like beans, whole grains, and fresh fruits and vegetables. Drinking more fluids as told. Ask your health care provider if it's safe to drive or use machines while taking your medicine. Incision care  Take care of the cuts in your belly as told. Make sure you: Wash your hands with soap and water for at least 20 seconds before and after you change your bandage. If you can't use soap and water, use hand sanitizer. Change your bandage. Leave stitches  or skin glue alone. Leave tape strips alone unless you're told to take them off. You may trim the edges of the tape strips if they curl up. Check the cuts on your belly every day for signs of infection. Check for: More redness, swelling, or pain. More fluid or blood. Warmth. Pus or a bad smell. Activity Rest as told. Get up to take short walks at least every 2 hours during the day. This helps you breathe better and keeps your blood flowing. Ask for help if you feel weak or unsteady. Do not take baths, swim, or use a hot tub until you're told it's OK. Ask if you can shower. Ask if it's OK for you to lift. If you were given a sedative, do not drive or use machines until you're told it's safe. A sedative  can make you sleepy. Ask what things are safe for you to do at home. Ask when you can go back to work or school. General instructions Hold a pillow over your belly when you cough or sneeze. This helps with pain. Wear a binder around your belly as told by your provider. Do not smoke, vape, or use nicotine or tobacco. Wear compression stockings to reduce swelling and help prevent blood clots in your legs. You may be asked to continue to do deep breathing exercises at home. This will help to prevent a lung infection. Contact a health care provider if: You have any signs of infection. You have pain that gets worse or does not get better with medicine. You throw up or you feel like throwing up. You have a cough. You have not pooped in 3 days. You are not able to pee. You have a fever. Get help right away if: You have very bad pain in your belly. You throw up every time you eat or drink. You have redness, warmth, or pain in your leg. You have chest pain. You have trouble breathing. These symptoms may be an emergency. Call 911 right away. Do not wait to see if the symptoms will go away. Do not drive yourself to the hospital. This information is not intended to replace advice given to you by your health care provider. Make sure you discuss any questions you have with your health care provider. Document Revised: 07/14/2022 Document Reviewed: 07/14/2022 Elsevier Patient Education  2024 [employer].General Anesthesia, Adult, Care After The following information offers guidance on how to care for yourself after your procedure. Your health care provider may also give you more specific instructions. If you have problems or questions, contact your health care provider. What can I expect after the procedure? After the procedure, it is common for people to: Have pain or discomfort at the IV site. Have nausea or vomiting. Have a sore throat or hoarseness. Have trouble concentrating. Feel cold or  chills. Feel weak, sleepy, or tired (fatigue). Have soreness and body aches. These can affect parts of the body that were not involved in surgery. Follow these instructions at home: For the time period you were told by your health care provider:  Rest. Do not participate in activities where you could fall or become injured. Do not drive or use machinery. Do not drink alcohol. Do not take sleeping pills or medicines that cause drowsiness. Do not make important decisions or sign legal documents. Do not take care of children on your own. General instructions Drink enough fluid to keep your urine pale yellow. If you have sleep apnea, surgery and certain medicines can increase  your risk for breathing problems. Follow instructions from your health care provider about wearing your sleep device: Anytime you are sleeping, including during daytime naps. While taking prescription pain medicines, sleeping medicines, or medicines that make you drowsy. Return to your normal activities as told by your health care provider. Ask your health care provider what activities are safe for you. Take over-the-counter and prescription medicines only as told by your health care provider. Do not use any products that contain nicotine or tobacco. These products include cigarettes, chewing tobacco, and vaping devices, such as e-cigarettes. These can delay incision healing after surgery. If you need help quitting, ask your health care provider. Contact a health care provider if: You have nausea or vomiting that does not get better with medicine. You vomit every time you eat or drink. You have pain that does not get better with medicine. You cannot urinate or have bloody urine. You develop a skin rash. You have a fever. Get help right away if: You have trouble breathing. You have chest pain. You vomit blood. These symptoms may be an emergency. Get help right away. Call 911. Do not wait to see if the symptoms will  go away. Do not drive yourself to the hospital. Summary After the procedure, it is common to have a sore throat, hoarseness, nausea, vomiting, or to feel weak, sleepy, or fatigue. For the time period you were told by your health care provider, do not drive or use machinery. Get help right away if you have difficulty breathing, have chest pain, or vomit blood. These symptoms may be an emergency. This information is not intended to replace advice given to you by your health care provider. Make sure you discuss any questions you have with your health care provider. Document Revised: 04/04/2021 Document Reviewed: 04/04/2021 Elsevier Patient Education  2024 Else[employer] Inc.How to Use Chlorhexidine at Home in the Shower Chlorhexidine gluconate (CHG) is a germ-killing (antiseptic) wash that's used to clean the skin. It can get rid of the germs that normally live on the skin and can keep them away for about 24 hours. If you're having surgery, you may be told to shower with CHG at home the night before surgery. This can help lower your risk for infection. To use CHG wash in the shower, follow the steps below. Supplies needed: CHG body wash. Clean washcloth. Clean towel. How to use CHG in the shower Follow these steps unless you're told to use CHG in a different way: Start the shower. Use your normal soap and shampoo to wash your face and hair. Turn off the shower or move out of the shower stream. Pour CHG onto a clean washcloth. Do not use any type of brush or rough sponge. Start at your neck, washing your body down to your toes. Make sure you: Wash the part of your body where the surgery will be done for at least 1 minute. Do not scrub. Do not use CHG on your head or face unless your health care provider tells you to. If it gets into your ears or eyes, rinse them well with water. Do not wash your genitals with CHG. Wash your back and under your arms. Make sure to wash skin folds. Let the CHG sit on  your skin for 1-2 minutes or as long as told. Rinse your entire body in the shower, including all body creases and folds. Turn off the shower. Dry off with a clean towel. Do not put anything on your skin afterward, such as  powder, lotion, or perfume. Put on clean clothes or pajamas. If it's the night before surgery, sleep in clean sheets. General tips Use CHG only as told, and follow the instructions on the label. Use the full amount of CHG as told. This is often one bottle. Do not smoke and stay away from flames after using CHG. Your skin may feel sticky after using CHG. This is normal. The sticky feeling will go away as the CHG dries. Do not use CHG: If you have a chlorhexidine allergy or have reacted to chlorhexidine in the past. On open wounds or areas of skin that have broken skin, cuts, or scrapes. On babies younger than 57 months of age. Contact a health care provider if: You have questions about using CHG. Your skin gets irritated or itchy. You have a rash after using CHG. You swallow any CHG. Call your local poison control center [phone] in the [place] Your eyes itch badly, or they become very red or swollen. Your hearing changes. You have trouble seeing. If you can't reach your provider, go to an urgent care or emergency room. Do not drive yourself. Get help right away if: You have swelling or tingling in your mouth or throat. You make high-pitched whistling sounds when you breathe, most often when you breathe out (wheeze). You have trouble breathing. These symptoms may be an emergency. Call 911 right away. Do not wait to see if the symptoms will go away. Do not drive yourself to the hospital. This information is not intended to replace advice given to you by your health care provider. Make sure you discuss any questions you have with your health care provider. Document Revised: 07/21/2022 Document Reviewed: 07/17/2021 Elsevier Patient Education  2024 [employer].

## 2023-04-01 ENCOUNTER — Encounter (HOSPITAL_COMMUNITY)
Admission: RE | Admit: 2023-04-01 | Discharge: 2023-04-01 | Disposition: A | Discharge status: Home / Self Care | Payer: Medicare HMO | Source: Ambulatory Visit | Attending: General Surgery | Admitting: General Surgery

## 2023-04-01 VITALS — BP 136/60 | HR 80 | Temp 98.0°F | Resp 18 | Ht 71.0 in | Wt 220.0 lb

## 2023-04-01 DIAGNOSIS — Z01812 Encounter for preprocedural laboratory examination: Secondary | ICD-10-CM | POA: Diagnosis not present

## 2023-04-01 DIAGNOSIS — E119 Type 2 diabetes mellitus without complications: Secondary | ICD-10-CM | POA: Insufficient documentation

## 2023-04-01 LAB — BASIC METABOLIC PANEL
Anion gap: 12 (ref 5–15)
BUN: 23 mg/dL (ref 8–23)
CO2: 24 mmol/L (ref 22–32)
Calcium: 8.7 mg/dL — ABNORMAL LOW (ref 8.9–10.3)
Chloride: 102 mmol/L (ref 98–111)
Creatinine, Ser: 1.03 mg/dL (ref 0.61–1.24)
GFR, Estimated: >60 mL/min (ref >60)
Glucose, Bld: 205 mg/dL — ABNORMAL HIGH (ref 70–99)
Potassium: 4 mmol/L (ref 3.5–5.1)
Sodium: 138 mmol/L (ref 135–145)

## 2023-04-01 LAB — HEMOGLOBIN A1C
Hgb A1c MFr Bld: 7.5 % — ABNORMAL HIGH (ref 4.8–5.6)
Mean Plasma Glucose: 168.55 mg/dL

## 2023-04-05 ENCOUNTER — Ambulatory Visit (HOSPITAL_BASED_OUTPATIENT_CLINIC_OR_DEPARTMENT_OTHER): Payer: Self-pay | Admitting: Anesthesiology

## 2023-04-05 ENCOUNTER — Encounter (HOSPITAL_COMMUNITY): Payer: Self-pay | Admitting: General Surgery

## 2023-04-05 ENCOUNTER — Ambulatory Visit (HOSPITAL_COMMUNITY): Payer: Self-pay | Admitting: Anesthesiology

## 2023-04-05 ENCOUNTER — Encounter (HOSPITAL_COMMUNITY)
Admission: RE | Disposition: A | Discharge status: Home / Self Care | Payer: Self-pay | Source: Home / Self Care | Attending: General Surgery

## 2023-04-05 ENCOUNTER — Ambulatory Visit (HOSPITAL_COMMUNITY)
Admission: RE | Admit: 2023-04-05 | Discharge: 2023-04-05 | Disposition: A | Discharge status: Home / Self Care | Payer: Medicare HMO | Attending: General Surgery | Admitting: General Surgery

## 2023-04-05 DIAGNOSIS — H409 Unspecified glaucoma: Secondary | ICD-10-CM | POA: Diagnosis not present

## 2023-04-05 DIAGNOSIS — E1151 Type 2 diabetes mellitus with diabetic peripheral angiopathy without gangrene: Secondary | ICD-10-CM | POA: Insufficient documentation

## 2023-04-05 DIAGNOSIS — K429 Umbilical hernia without obstruction or gangrene: Secondary | ICD-10-CM

## 2023-04-05 DIAGNOSIS — E119 Type 2 diabetes mellitus without complications: Secondary | ICD-10-CM

## 2023-04-05 DIAGNOSIS — Z87891 Personal history of nicotine dependence: Secondary | ICD-10-CM | POA: Insufficient documentation

## 2023-04-05 DIAGNOSIS — I10 Essential (primary) hypertension: Secondary | ICD-10-CM

## 2023-04-05 DIAGNOSIS — M4802 Spinal stenosis, cervical region: Secondary | ICD-10-CM | POA: Diagnosis not present

## 2023-04-05 DIAGNOSIS — K219 Gastro-esophageal reflux disease without esophagitis: Secondary | ICD-10-CM | POA: Diagnosis not present

## 2023-04-05 DIAGNOSIS — Z833 Family history of diabetes mellitus: Secondary | ICD-10-CM | POA: Insufficient documentation

## 2023-04-05 LAB — GLUCOSE, CAPILLARY: Glucose-Capillary: 109 mg/dL — ABNORMAL HIGH (ref 70–99)

## 2023-04-05 SURGERY — REPAIR, HERNIA, UMBILICAL, ROBOT-ASSISTED
Anesthesia: General | Site: Abdomen

## 2023-04-05 MED ORDER — FENTANYL CITRATE PF 50 MCG/ML IJ SOSY
25.0000 ug | PREFILLED_SYRINGE | INTRAMUSCULAR | Status: DC | PRN
  Administered 2023-04-05: 50 ug via INTRAVENOUS
  Filled 2023-04-05 (×2): qty 1

## 2023-04-05 MED ORDER — LACTATED RINGERS IV SOLN: INTRAVENOUS | Status: DC

## 2023-04-05 MED ORDER — PROPOFOL 10 MG/ML IV BOLUS
INTRAVENOUS | Status: AC
  Filled 2023-04-05: qty 20

## 2023-04-05 MED ORDER — OXYCODONE HCL 5 MG PO TABS: 5.0000 mg | ORAL_TABLET | Freq: Once | ORAL | Status: DC | PRN

## 2023-04-05 MED ORDER — OXYCODONE HCL 5 MG/5ML PO SOLN: 5.0000 mg | Freq: Once | ORAL | Status: DC | PRN

## 2023-04-05 MED ORDER — ROCURONIUM BROMIDE 10 MG/ML (PF) SYRINGE
PREFILLED_SYRINGE | INTRAVENOUS | Status: DC | PRN
  Administered 2023-04-05: 60 mg via INTRAVENOUS
  Administered 2023-04-05: 20 mg via INTRAVENOUS
  Administered 2023-04-05 (×2): 10 mg via INTRAVENOUS

## 2023-04-05 MED ORDER — CHLORHEXIDINE GLUCONATE 0.12 % MT SOLN
15.0000 mL | Freq: Once | OROMUCOSAL | Status: DC
  Filled 2023-04-05: qty 15

## 2023-04-05 MED ORDER — TRAMADOL HCL 50 MG PO TABS: 50.0000 mg | ORAL_TABLET | Freq: Four times a day (QID) | ORAL | 0 refills | Status: AC | PRN

## 2023-04-05 MED ORDER — ORAL CARE MOUTH RINSE: 15.0000 mL | Freq: Once | OROMUCOSAL | Status: DC

## 2023-04-05 MED ORDER — ROCURONIUM BROMIDE 10 MG/ML (PF) SYRINGE
PREFILLED_SYRINGE | INTRAVENOUS | Status: AC
  Filled 2023-04-05: qty 10

## 2023-04-05 MED ORDER — DEXAMETHASONE SODIUM PHOSPHATE 10 MG/ML IJ SOLN
INTRAMUSCULAR | Status: DC | PRN
  Administered 2023-04-05: 5 mg via INTRAVENOUS

## 2023-04-05 MED ORDER — SUGAMMADEX SODIUM 200 MG/2ML IV SOLN
INTRAVENOUS | Status: AC
  Filled 2023-04-05: qty 2

## 2023-04-05 MED ORDER — SUGAMMADEX SODIUM 200 MG/2ML IV SOLN
INTRAVENOUS | Status: DC | PRN
  Administered 2023-04-05: 200 mg via INTRAVENOUS

## 2023-04-05 MED ORDER — VANCOMYCIN HCL IN DEXTROSE 1-5 GM/200ML-% IV SOLN
1000.0000 mg | INTRAVENOUS | Status: AC
  Administered 2023-04-05: 1000 mg via INTRAVENOUS
  Filled 2023-04-05: qty 200

## 2023-04-05 MED ORDER — ONDANSETRON HCL 4 MG/2ML IJ SOLN
INTRAMUSCULAR | Status: DC | PRN
  Administered 2023-04-05: 4 mg via INTRAVENOUS

## 2023-04-05 MED ORDER — ONDANSETRON HCL 4 MG/2ML IJ SOLN: 4.0000 mg | Freq: Once | INTRAMUSCULAR | Status: DC | PRN

## 2023-04-05 MED ORDER — LIDOCAINE HCL (CARDIAC) PF 100 MG/5ML IV SOSY
PREFILLED_SYRINGE | INTRAVENOUS | Status: DC | PRN
  Administered 2023-04-05: 60 mg via INTRATRACHEAL

## 2023-04-05 MED ORDER — CHLORHEXIDINE GLUCONATE CLOTH 2 % EX PADS: 6.0000 | MEDICATED_PAD | Freq: Once | CUTANEOUS | Status: DC

## 2023-04-05 MED ORDER — ONDANSETRON HCL 4 MG/2ML IJ SOLN
INTRAMUSCULAR | Status: AC
  Filled 2023-04-05: qty 2

## 2023-04-05 MED ORDER — PROPOFOL 10 MG/ML IV BOLUS
INTRAVENOUS | Status: DC | PRN
  Administered 2023-04-05: 150 mg via INTRAVENOUS

## 2023-04-05 MED ORDER — FENTANYL CITRATE (PF) 250 MCG/5ML IJ SOLN
INTRAMUSCULAR | Status: AC
  Filled 2023-04-05: qty 5

## 2023-04-05 MED ORDER — BUPIVACAINE HCL (PF) 0.5 % IJ SOLN
INTRAMUSCULAR | Status: DC | PRN
  Administered 2023-04-05: 30 mL

## 2023-04-05 MED ORDER — LIDOCAINE HCL (PF) 2 % IJ SOLN
INTRAMUSCULAR | Status: AC
Start: 2023-04-05 — End: ?
  Filled 2023-04-05: qty 5

## 2023-04-05 MED ORDER — BUPIVACAINE HCL (PF) 0.5 % IJ SOLN
INTRAMUSCULAR | Status: AC
  Filled 2023-04-05: qty 30

## 2023-04-05 MED ORDER — DEXAMETHASONE SODIUM PHOSPHATE 10 MG/ML IJ SOLN
INTRAMUSCULAR | Status: AC
  Filled 2023-04-05: qty 1

## 2023-04-05 MED ORDER — STERILE WATER FOR IRRIGATION IR SOLN
Status: DC | PRN
  Administered 2023-04-05: 500 mL

## 2023-04-05 MED ORDER — FENTANYL CITRATE (PF) 100 MCG/2ML IJ SOLN
INTRAMUSCULAR | Status: DC | PRN
Start: 2023-04-05 — End: 2023-04-05
  Administered 2023-04-05 (×2): 50 ug via INTRAVENOUS
  Administered 2023-04-05: 100 ug via INTRAVENOUS

## 2023-04-05 MED ORDER — KETOROLAC TROMETHAMINE 30 MG/ML IJ SOLN
30.0000 mg | Freq: Once | INTRAMUSCULAR | Status: AC
  Administered 2023-04-05: 30 mg via INTRAVENOUS
  Filled 2023-04-05: qty 1

## 2023-04-05 SURGICAL SUPPLY — 41 items
CHLORAPREP W/TINT 26 (MISCELLANEOUS) ×1 IMPLANT
COVER LIGHT HANDLE STERIS (MISCELLANEOUS) ×1 IMPLANT
COVER MAYO STAND XLG (MISCELLANEOUS) ×1 IMPLANT
COVER TIP SHEARS 8 DVNC (MISCELLANEOUS) ×1 IMPLANT
DERMABOND ADVANCED .7 DNX12 (GAUZE/BANDAGES/DRESSINGS) ×1 IMPLANT
DRAPE ARM DVNC X/XI (DISPOSABLE) ×3 IMPLANT
DRAPE COLUMN DVNC XI (DISPOSABLE) ×1 IMPLANT
DRIVER NDL MEGA SUTCUT DVNCXI (INSTRUMENTS) ×1 IMPLANT
DRIVER NDLE MEGA SUTCUT DVNCXI (INSTRUMENTS) ×1 IMPLANT
DRSG TEGADERM 2-3/8X2-3/4 SM (GAUZE/BANDAGES/DRESSINGS) IMPLANT
ELECT REM PT RETURN 9FT ADLT (ELECTROSURGICAL) ×1 IMPLANT
ELECTRODE REM PT RTRN 9FT ADLT (ELECTROSURGICAL) ×1 IMPLANT
FORCEPS BPLR R/ABLATION 8 DVNC (INSTRUMENTS) ×1 IMPLANT
GAUZE SPONGE 2X2 8PLY STRL LF (GAUZE/BANDAGES/DRESSINGS) IMPLANT
GLOVE BIOGEL PI IND STRL 7.0 (GLOVE) ×3 IMPLANT
GLOVE SURG SS PI 7.5 STRL IVOR (GLOVE) ×2 IMPLANT
GOWN STRL REUS W/TWL LRG LVL3 (GOWN DISPOSABLE) ×3 IMPLANT
MANIFOLD NEPTUNE II (INSTRUMENTS) ×1 IMPLANT
MESH VENTRALIGHT ST 4.5IN (Mesh General) IMPLANT
NDL HYPO 21X1.5 SAFETY (NEEDLE) ×1 IMPLANT
NDL INSUFFLATION 14GA 120MM (NEEDLE) ×1 IMPLANT
NEEDLE HYPO 21X1.5 SAFETY (NEEDLE) ×1 IMPLANT
NEEDLE INSUFFLATION 14GA 120MM (NEEDLE) ×1 IMPLANT
OBTURATOR OPTICAL STND 8 DVNC (TROCAR) ×1 IMPLANT
OBTURATOR OPTICALSTD 8 DVNC (TROCAR) ×1 IMPLANT
PACK LAP CHOLE LZT030E (CUSTOM PROCEDURE TRAY) ×1 IMPLANT
PENCIL HANDSWITCHING (ELECTRODE) ×1 IMPLANT
POSITIONER HEAD 8X9X4 ADT (SOFTGOODS) ×1 IMPLANT
SCISSORS MNPLR CVD DVNC XI (INSTRUMENTS) ×1 IMPLANT
SEAL UNIV 5-12 XI (MISCELLANEOUS) ×3 IMPLANT
SET TUBE SMOKE EVAC HIGH FLOW (TUBING) ×1 IMPLANT
SUT MNCRL AB 4-0 PS2 18 (SUTURE) ×2 IMPLANT
SUT STRATA 3-0 SH (SUTURE) ×6 IMPLANT
SUT STRATAFIX 0 PDS+ CT-2 23 (SUTURE) ×1 IMPLANT
SUTURE STRATFX 0 PDS+ CT-2 23 (SUTURE) ×1 IMPLANT
SYR 30ML LL (SYRINGE) ×1 IMPLANT
SYS RETRIEVAL 5MM INZII UNIV (BASKET) ×1 IMPLANT
SYSTEM RETRIEVL 5MM INZII UNIV (BASKET) IMPLANT
TAPE TRANSPORE STRL 2 31045 (GAUZE/BANDAGES/DRESSINGS) ×1 IMPLANT
TRAY FOLEY W/BAG SLVR 16FR ST (SET/KITS/TRAYS/PACK) ×1 IMPLANT
WATER STERILE IRR 500ML POUR (IV SOLUTION) ×1 IMPLANT

## 2023-04-05 NOTE — Interval H&P Note (Signed)
 History and Physical Interval Note:  04/05/2023 6:51 AM  [facility].  has presented today for surgery, with the diagnosis of UMBILICAL HERNIA.  The various methods of treatment have been discussed with the patient and family. After consideration of risks, benefits and other options for treatment, the patient has consented to  Procedure(s): XI ROBOT ASSISTED UMBILICAL HERNIA REPAIR W/ MESH (N/A) as a surgical intervention.  The patient's history has been reviewed, patient examined, no change in status, stable for surgery.  I have reviewed the patient's chart and labs.  Questions were answered to the patient's satisfaction.     Grant [name]

## 2023-04-05 NOTE — Addendum Note (Signed)
 Addendum  created 04/05/23 1051 by Ronelle Nigh, MD   Clinical Note Signed

## 2023-04-05 NOTE — Anesthesia Preprocedure Evaluation (Addendum)
 Anesthesia Evaluation  Patient identified by MRN, date of birth, ID band Patient awake    Reviewed: Allergy & Precautions, H&P , NPO status , Patient's Chart, lab work & pertinent test results, reviewed documented beta blocker date and time   Airway Mallampati: IV  TM Distance: >3 FB Neck ROM: full    Dental  (+) Upper Dentures, Lower Dentures   Pulmonary former smoker   Pulmonary exam normal breath sounds clear to auscultation       Cardiovascular hypertension, Normal cardiovascular exam Rhythm:regular Rate:Normal  claudication   Neuro/Psych Cervical stenosis. glaucoma  negative psych ROS   GI/Hepatic negative GI ROS, Neg liver ROS,GERD  ,,  Endo/Other  diabetes, Type 2    Renal/GU negative Renal ROS  negative genitourinary   Musculoskeletal   Abdominal   Peds  Hematology negative hematology ROS (+)   Anesthesia Other Findings cancer  Reproductive/Obstetrics negative OB ROS                             Anesthesia Physical Anesthesia Plan  ASA: 3  Anesthesia Plan: General   Post-op Pain Management: Dilaudid IV   Induction: Intravenous  PONV Risk Score and Plan: Ondansetron, Dexamethasone and Midazolam  Airway Management Planned: Video Laryngoscope Planned and Oral ETT  Additional Equipment:   Intra-op Plan:   Post-operative Plan: Extubation in OR  Informed Consent: I have reviewed the patients History and Physical, chart, labs and discussed the procedure including the risks, benefits and alternatives for the proposed anesthesia with the patient or authorized representative who has indicated his/her understanding and acceptance.     Dental Advisory Given  Plan Discussed with: CRNA  Anesthesia Plan Comments:        Anesthesia Quick Evaluation

## 2023-04-05 NOTE — Anesthesia Procedure Notes (Signed)
 Procedure Name: Intubation Date/Time: 04/05/2023 7:39 AM  Performed by: Lorin Glass, CRNAPre-anesthesia Checklist: Patient identified, Emergency Drugs available, Suction available and Patient being monitored Patient Re-evaluated:Patient Re-evaluated prior to induction Oxygen Delivery Method: Circle system utilized Preoxygenation: Pre-oxygenation with 100% oxygen Induction Type: IV induction Ventilation: Mask ventilation without difficulty and Oral airway inserted - appropriate to patient size Laryngoscope Size: Glidescope and 3 Grade View: Grade I Tube type: Oral Tube size: 7.5 mm Number of attempts: 1 Airway Equipment and Method: Rigid stylet and Video-laryngoscopy Placement Confirmation: positive ETCO2, breath sounds checked- equal and bilateral and ETT inserted through vocal cords under direct vision Secured at: 22 cm Tube secured with: Tape Dental Injury: Teeth and Oropharynx as per pre-operative assessment  Comments: Intubation performed by Allyne Gee, MD Student

## 2023-04-05 NOTE — Anesthesia Postprocedure Evaluation (Signed)
 Anesthesia Post Note  Patient: Grant [name].  Procedure(s) Performed: XI ROBOT ASSISTED UMBILICAL HERNIA REPAIR W/ MESH (Abdomen)  Patient location during evaluation: PACU Anesthesia Type: General Level of consciousness: awake and alert Pain management: pain level controlled Vital Signs Assessment: post-procedure vital signs reviewed and stable Respiratory status: spontaneous breathing, nonlabored ventilation, respiratory function stable and patient connected to nasal cannula oxygen Cardiovascular status: blood pressure returned to baseline and stable Postop Assessment: no apparent nausea or vomiting Anesthetic complications: no   There were no known notable events for this encounter.   Last Vitals:  Vitals:   04/05/23 0918 04/05/23 0930  BP: (!) 187/85 (!) 165/87  Pulse: 97 94  Resp: 13 14  Temp: 37.1 C   SpO2: 96% 95%    Last Pain:  Vitals:   04/05/23 0641  TempSrc: Oral  PainSc: 0-No pain                 Reagyn Facemire L Ailany Koren

## 2023-04-05 NOTE — Transfer of Care (Signed)
 Immediate Anesthesia Transfer of Care Note  Patient: Grant [name] [facility].  Procedure(s) Performed: XI ROBOT ASSISTED UMBILICAL HERNIA REPAIR W/ MESH (Abdomen)  Patient Location: PACU  Anesthesia Type:General  Level of Consciousness: awake  Airway & Oxygen Therapy: Patient Spontanous Breathing and Patient connected to nasal cannula oxygen  Post-op Assessment: Report given to RN and Post -op Vital signs reviewed and stable  Post vital signs: Reviewed and stable  Last Vitals:  Vitals Value Taken Time  BP 187/85   Temp 98.8   Pulse 97 04/05/23 0918  Resp 13 04/05/23 0918  SpO2 96 % 04/05/23 0918  Vitals shown include unfiled device data.  Last Pain:  Vitals:   04/05/23 0641  TempSrc: Oral  PainSc: 0-No pain         Complications: No notable events documented.

## 2023-04-05 NOTE — Op Note (Signed)
 Patient:  Grant [name] [facility].  DOB:  03/01/1945  MRN:  [id number]   Preop Diagnosis: Umbilical hernia  Postop Diagnosis: Same  Procedure: Robotic assisted laparoscopic umbilical herniorrhaphy with mesh  Surgeon: Franky Macho, MD  Anes: General Endotracheal  Indications: Patient is a 78 year old white male who presents with a reducible 5 cm umbilical hernia.  The risks and benefits of the procedure including bleeding, infection, mesh use, and the possibility of recurrence of the hernia were fully explained to the patient, who gave informed consent.  Procedure note: The patient was placed in the supine position.  After induction of general endotracheal anesthesia, the abdomen was prepped and draped using the usual sterile technique with ChloraPrep.  Surgical site confirmation was performed.  An incision was made in the left upper quadrant at Palmer's point.  A Veress needle was introduced into the abdominal cavity and confirmation of placement was done using the saline drop test.  The abdomen was then insufflated to 15 mmHg pressure.  An 8 mm trocar was introduced into the abdominal cavity under direct visualization without difficulty.  Additional 8 mm trocars were placed in the left flank and left lower quadrant regions.  The robot was then docked and targeted.  The umbilicus was inspected and no contents were in the hernia sac.  The hernia sac was excised circumferentially and removed from the operative field.  The fascia was then reapproximated longitudinally using an 0 Stratafix running suture.  An 11 cm round ventral light dual mesh was then inserted and secured circumferentially to the fascia using a 3-0 Stratafix running suture.  All air was then evacuated from the abdominal cavity prior to removal of the trocars.  All wounds were irrigated with normal saline.  All wounds were injected with 0.5% Sensorcaine.  All incisions were closed using a 4-0 Monocryl subcuticular suture.   Dermabond was applied.  All tape and needle counts were correct at the end of the procedure.  The patient was extubated in the operating room and transferred to PACU in stable condition.    Complications: None  EBL: Minimal  Specimen: None

## 2023-04-05 NOTE — Discharge Instructions (Addendum)
  Post Anesthesia Home Care Instructions  Activity: Get plenty of rest for the remainder of the day. A responsible individual must stay with you for 24 hours following the procedure.  For the next 24 hours, DO NOT: -Drive a car -Advertising copywriter -Drink alcoholic beverages -Take any medication unless instructed by your physician -Make any legal decisions or sign important papers.  Meals: Start with liquid foods such as gelatin or soup. Progress to regular foods as tolerated. Avoid greasy, spicy, heavy foods. If nausea and/or vomiting occur, drink only clear liquids until the nausea and/or vomiting subsides. Call your physician if vomiting continues.  Special Instructions/Symptoms: Your throat may feel dry or sore from the anesthesia or the breathing tube placed in your throat during surgery. If this causes discomfort, gargle with warm salt water. The discomfort should disappear within 24 hours.      No NSAIDS until 3:30 p.m.

## 2023-04-08 ENCOUNTER — Inpatient Hospital Stay
Admission: RE | Admit: 2023-04-08 | Discharge: 2023-04-08 | Disposition: A | Discharge status: Home / Self Care | Source: Ambulatory Visit | Attending: Urology | Admitting: Urology

## 2023-04-08 NOTE — Progress Notes (Incomplete)
 Chief Complaint: Patient was seen in consultation today for No chief complaint on file.  at the request of McKenzie,Patrick L  Referring Physician(s): McKenzie,Patrick L  History of Present Illness: Grant Neis. is a 78 y.o. male ***  Past Medical History:  Diagnosis Date   Cancer [facility])    prostate   Cervical stenosis of spine    Claudication (HCC)    Claudication (HCC)    Diabetes mellitus without complication (HCC)    GERD (gastroesophageal reflux disease)    Glaucoma (increased eye pressure)    Hyperlipidemia    Hypertension    Wears glasses     Past Surgical History:  Procedure Laterality Date   fracture back     HERNIA REPAIR     POSTERIOR CERVICAL FUSION/FORAMINOTOMY N/A 09/10/2017   Procedure: LAMINECTOMY CERVICAL FOUR - CERVICAL FIVE, CERVICAL FIVE - CERVICAL SIX, CERVICAL SIX - CERVICAL SEVEN, POSTERIOR SEGMENTAL INSTRUMENTATION, POSTERIOR LATERAL ARTHRODESIS;  Surgeon: Lisbeth Renshaw, MD;  Location: [facility] OR;  Service: Neurosurgery;  Laterality: N/A;  LAMINECTOMY CERVICAL FOUR - CERVICAL FIVE, CERVICAL FIVE - CERVICAL SIX, CERVICAL SIX - CERVICAL SEVEN, POSTERIOR SEGMENTAL    Allergies: Morphine, Augmentin [amoxicillin-pot clavulanate], Hydrocodone, and Oxycontin [oxycodone hcl]  Medications: Prior to Admission medications   Medication Sig Start Date End Date Taking? Authorizing Provider  amLODipine (NORVASC) 5 MG tablet Take 1 tablet (5 mg total) by mouth daily. Patient taking differently: Take 5 mg by mouth 2 (two) times daily. 02/04/21 03/29/23  Cantwell, Celeste C, PA-C  aspirin EC 325 MG tablet Take 650 mg by mouth every 6 (six) hours as needed (headaches).    [provider]  cilostazol (PLETAL) 50 MG tablet TAKE 1 TABLET BY MOUTH TWICE A DAY 11/28/18   Patwardhan, Manish J, MD  esomeprazole (NEXIUM) 20 MG capsule Take 20 mg by mouth daily as needed (acid reflux).    [provider]  finasteride (PROSCAR) 5 MG tablet Take  1 tablet (5 mg total) by mouth daily. 02/12/23   McKenzie, Mardene Celeste, MD  latanoprost (XALATAN) 0.005 % ophthalmic solution Place 1 drop into both eyes at bedtime. 01/07/21   [provider]  losartan (COZAAR) 100 MG tablet Take 100 mg by mouth daily.    [provider]  metFORMIN (GLUCOPHAGE) 500 MG tablet Take 500 mg by mouth at bedtime.    [provider]  Multiple Vitamins-Minerals (MULTIVITAMIN WITH MINERALS) tablet Take 1 tablet by mouth daily.    [provider]  [facility] ULTRA test strip 1 each daily. 09/12/19   [provider]  pantoprazole (PROTONIX) 40 MG tablet Take 40 mg by mouth at bedtime.    [provider]  Probiotic Product (ACIDOPHILUS HIGH-POTENCY) CAPS Take 1 capsule by mouth at bedtime. 09/10/22   [provider]  rosuvastatin (CRESTOR) 20 MG tablet Take 1 tablet (20 mg total) by mouth daily. 05/09/18   Patwardhan, Anabel Bene, MD  silodosin (RAPAFLO) 8 MG CAPS capsule Take 1 capsule (8 mg total) by mouth 2 (two) times daily. 02/12/23   McKenzie, Mardene Celeste, MD  traMADol (ULTRAM) 50 MG tablet Take 1 tablet (50 mg total) by mouth every 6 (six) hours as needed. 04/05/23   Franky Macho, MD  TRESIBA FLEXTOUCH 100 UNIT/ML FlexTouch Pen Inject 46 Units into the skin daily. 07/07/21   [provider]     Family History  Problem Relation Age of Onset   Diabetes Mother    Lupus Mother    Prostate  cancer Father     Social History   Socioeconomic History   Marital status: Married    Spouse name: Not on file   Number of children: Not on file   Years of education: Not on file   Highest education level: Not on file  Occupational History   Not on file  Tobacco Use   Smoking status: Former    Current packs/day: 0.00    Types: Cigarettes    Quit date: 03/21/2014    Years since quitting: 9.0   Smokeless tobacco: Never  Vaping Use   Vaping status: Never Used  Substance and Sexual Activity   Alcohol use: Yes     Comment: occ   Drug use: No   Sexual activity: Never    Birth control/protection: None  Other Topics Concern   Not on file  Social History Narrative   Not on file   Social Drivers of Health   Financial Resource Strain: Not on file  Food Insecurity: Not on file  Transportation Needs: Not on file  Physical Activity: Not on file  Stress: Not on file  Social Connections: Not on file    ECOG Status: {CHL ONC ECOG ZO:[id number]}  Review of Systems: A 12 point ROS discussed and pertinent positives are indicated in the HPI above.  All other systems are negative.  Review of Systems  Vital Signs: There were no vitals taken for this visit.  Physical Exam  Mallampati Score:     Imaging:  [place] Prostate, 07/31/15     MR Prostate, 06/24/16  IMPRESSION:  No findings suspicious for high-grade macroscopic prostate cancer in this patient with known small volume tumor.   No evidence of extracapsular extension, seminal vesicle invasion, or metastatic disease.   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  Calculated prostate volume 81 mL.      No results found.  Labs:  CBC: Recent Labs    10/28/22 2213  WBC 8.4  HGB 12.3*  HCT 37.7*  PLT 144*    COAGS: No results for input(s): "INR", "APTT" in the last 8760 hours.  BMP: Recent Labs    10/28/22 2213 04/01/23 1123  NA 137 138  K 3.7 4.0  CL 103 102  CO2 28 24  GLUCOSE 176* 205*  BUN 25* 23  CALCIUM 8.4* 8.7*  CREATININE 1.09 1.03  GFRNONAA >60 >60    LIVER FUNCTION TESTS: No results for input(s): "BILITOT", "AST", "ALT", "ALKPHOS", "PROT", "ALBUMIN" in the last 8760 hours.  TUMOR MARKERS: No results for input(s): "AFPTM", "CEA", "CA199", "CHROMGRNA" in the last 8760 hours.  Assessment and Plan:  ***  Thank you for this interesting consult.  I greatly enjoyed meeting Grant F Singleton Jr. and look forward to participating in their care.  A copy of this report was sent to the requesting  provider on this date.  Electronically Signed:  Roanna Banning, MD [facility]   Pager. [phone] Clinic. [phone]  I spent a total of {New INPT:304952001} {New Out-Pt:304952002}  {Established Out-Pt:304952003} in face to face in clinical consultation, greater than 50% of which was counseling/coordinating care for ***

## 2023-04-15 ENCOUNTER — Other Ambulatory Visit

## 2023-04-20 ENCOUNTER — Encounter: Payer: Self-pay | Admitting: General Surgery

## 2023-04-20 ENCOUNTER — Ambulatory Visit (INDEPENDENT_AMBULATORY_CARE_PROVIDER_SITE_OTHER): Admitting: General Surgery

## 2023-04-20 VITALS — BP 108/58 | HR 88 | Temp 97.7°F | Resp 16 | Ht 71.0 in | Wt 216.0 lb

## 2023-04-20 DIAGNOSIS — Z9889 Other specified postprocedural states: Secondary | ICD-10-CM | POA: Diagnosis not present

## 2023-04-20 DIAGNOSIS — Z8719 Personal history of other diseases of the digestive system: Secondary | ICD-10-CM | POA: Diagnosis not present

## 2023-04-20 NOTE — Progress Notes (Signed)
 Subjective:     [employer].  Patient here for postoperative visit, status post robotic assisted laparoscopic umbilical herniorrhaphy with mesh.  Patient is doing very well.  He has no complaints.  He is pleased with the results. Objective:    BP (!) 108/58   Pulse 88   Temp 97.7 F (36.5 C) (Oral)   Resp 16   Ht 5\' 11"  (1.803 m)   Wt 216 lb (98 kg)   SpO2 90%   BMI 30.13 kg/m   General:  alert, cooperative, and no distress  Abdomen soft, incisions healing well.  No hernia present.  No seroma present.     Assessment:    Doing well postoperatively.    Plan:   May increase activity as able.  Follow-up here as needed.

## 2023-04-29 ENCOUNTER — Ambulatory Visit
Admission: RE | Admit: 2023-04-29 | Discharge: 2023-04-29 | Disposition: A | Discharge status: Home / Self Care | Source: Ambulatory Visit | Attending: Urology

## 2023-04-29 DIAGNOSIS — R351 Nocturia: Secondary | ICD-10-CM | POA: Diagnosis not present

## 2023-04-29 DIAGNOSIS — N138 Other obstructive and reflux uropathy: Secondary | ICD-10-CM

## 2023-04-29 DIAGNOSIS — N401 Enlarged prostate with lower urinary tract symptoms: Secondary | ICD-10-CM | POA: Diagnosis not present

## 2023-04-29 DIAGNOSIS — R3916 Straining to void: Secondary | ICD-10-CM | POA: Diagnosis not present

## 2023-04-29 DIAGNOSIS — R3914 Feeling of incomplete bladder emptying: Secondary | ICD-10-CM | POA: Diagnosis not present

## 2023-04-29 HISTORY — PX: IR RADIOLOGIST EVAL & MGMT: IMG5224

## 2023-04-29 NOTE — Consult Note (Signed)
 Chief Complaint: Patient was seen in consultation today for benign prostatic hyperplasia with lower urinary tract symptoms.   Referring Physician(s): McKenzie,Patrick L  Supervising Physician: Malachy Moan  Patient Status: [facility] - out patient   History of Present Illness: Grant [name]. is a 78 y.o. male with a medical history significant for DM, HTN, prostate cancer and benign prostatic hyperplasia with lower urinary tract symptoms. He recently underwent laparoscopic umbilical herniorrhaphy with mesh 04/05/23. He has been followed by Dr. Ronne Binning for many years for prostate cancer surveillance and BPH with lower urinary tract symptoms. The patient struggles with nocturia and sometimes must urinate every two hours. He also has complaints of straining to urinate and the sensation of incomplete bladder emptying. Dr. Ronne Binning currently has the patient on silodosin, finasteride and alfuzosin. These medications have been insufficient in managing his lower urinary tract symptoms. The patient is adamantly opposed to surgery and he presents today at the kind referral of Dr. Ronne Binning to discuss prostate artery embolization.   IPSS 25/QoL 5   Past Medical History:  Diagnosis Date   Cancer [facility])    prostate   Cervical stenosis of spine    Claudication (HCC)    Claudication (HCC)    Diabetes mellitus without complication (HCC)    GERD (gastroesophageal reflux disease)    Glaucoma (increased eye pressure)    Hyperlipidemia    Hypertension    Wears glasses     Past Surgical History:  Procedure Laterality Date   fracture back     HERNIA REPAIR     POSTERIOR CERVICAL FUSION/FORAMINOTOMY N/A 09/10/2017   Procedure: LAMINECTOMY CERVICAL FOUR - CERVICAL FIVE, CERVICAL FIVE - CERVICAL SIX, CERVICAL SIX - CERVICAL SEVEN, POSTERIOR SEGMENTAL INSTRUMENTATION, POSTERIOR LATERAL ARTHRODESIS;  Surgeon: Lisbeth Renshaw, MD;  Location: [facility] OR;  Service: Neurosurgery;  Laterality:  N/A;  LAMINECTOMY CERVICAL FOUR - CERVICAL FIVE, CERVICAL FIVE - CERVICAL SIX, CERVICAL SIX - CERVICAL SEVEN, POSTERIOR SEGMENTAL    Allergies: Morphine, Augmentin [amoxicillin-pot clavulanate], Hydrocodone, and Oxycontin [oxycodone hcl]  Medications: Prior to Admission medications   Medication Sig Start Date End Date Taking? Authorizing Provider  aspirin EC 325 MG tablet Take 650 mg by mouth every 6 (six) hours as needed (headaches).   Yes [provider]  cilostazol (PLETAL) 50 MG tablet TAKE 1 TABLET BY MOUTH TWICE A DAY 11/28/18  Yes Patwardhan, Manish J, MD  esomeprazole (NEXIUM) 20 MG capsule Take 20 mg by mouth daily as needed (acid reflux).   Yes [provider]  finasteride (PROSCAR) 5 MG tablet Take 1 tablet (5 mg total) by mouth daily. 02/12/23  Yes McKenzie, Mardene Celeste, MD  latanoprost (XALATAN) 0.005 % ophthalmic solution Place 1 drop into both eyes at bedtime. 01/07/21  Yes [provider]  losartan (COZAAR) 100 MG tablet Take 100 mg by mouth daily.   Yes [provider]  metFORMIN (GLUCOPHAGE) 500 MG tablet Take 500 mg by mouth at bedtime.   Yes [provider]  Multiple Vitamins-Minerals (MULTIVITAMIN WITH MINERALS) tablet Take 1 tablet by mouth daily.   Yes [provider]  [facility] ULTRA test strip 1 each daily. 09/12/19  Yes [provider]  pantoprazole (PROTONIX) 40 MG tablet Take 40 mg by mouth at bedtime.   Yes [provider]  Probiotic Product (ACIDOPHILUS HIGH-POTENCY) CAPS Take 1 capsule by mouth at bedtime. 09/10/22  Yes [provider]  rosuvastatin (CRESTOR) 20 MG tablet Take 1 tablet (20 mg total) by mouth daily. 05/09/18  Yes Patwardhan, Manish J, MD  silodosin (RAPAFLO) 8 MG CAPS capsule Take 1 capsule (8 mg total) by mouth 2 (two) times daily. 02/12/23  Yes McKenzie, Mardene Celeste, MD  TRESIBA FLEXTOUCH 100 UNIT/ML FlexTouch Pen Inject 46 Units into the skin daily. 07/07/21  Yes [provider]  amLODipine (NORVASC) 5 MG tablet Take 1 tablet (5 mg total) by mouth daily. Patient taking differently: Take 5 mg by mouth 2 (two) times daily. 02/04/21 04/20/23  Cantwell, Celeste C, PA-C  traMADol (ULTRAM) 50 MG tablet Take 1 tablet (50 mg total) by mouth every 6 (six) hours as needed. Patient not taking: Reported on 04/29/2023 04/05/23   Franky Macho, MD     Family History  Problem Relation Age of Onset   Diabetes Mother    Lupus Mother    Prostate cancer Father     Social History   Socioeconomic History   Marital status: Married    Spouse name: Not on file   Number of children: Not on file   Years of education: Not on file   Highest education level: Not on file  Occupational History   Not on file  Tobacco Use   Smoking status: Former    Current packs/day: 0.00    Types: Cigarettes    Quit date: 03/21/2014    Years since quitting: 9.1   Smokeless tobacco: Never  Vaping Use   Vaping status: Never Used  Substance and Sexual Activity   Alcohol use: Yes    Comment: occ   Drug use: No   Sexual activity: Never    Birth control/protection: None  Other Topics Concern   Not on file  Social History Narrative   Not on file   Social Drivers of Health   Financial Resource Strain: Not on file  Food Insecurity: Not on file  Transportation Needs: Not on file  Physical Activity: Not on file  Stress: Not on file  Social Connections: Not on file    Review of Systems: A 12 point ROS discussed and pertinent positives are indicated in the HPI above.  All other systems are negative.  Review of Systems  Cardiovascular:  Negative for chest pain and leg swelling.  Gastrointestinal:  Negative for abdominal pain.  Genitourinary:  Positive for decreased urine volume, difficulty urinating and frequency.    Vital Signs: BP (!) 140/68   Pulse 80   Temp 97.8 F (36.6 C)   Resp 16   SpO2 97%   Advance Care Plan: The advanced care plan/surrogate decision maker was  discussed at the time of visit and documented in the medical record.    Physical Exam Constitutional:      General: He is not in acute distress.    Appearance: He is not ill-appearing.  HENT:     Mouth/Throat:     Mouth: Mucous membranes are moist.     Pharynx: Oropharynx is clear.  Cardiovascular:     Rate and Rhythm: Normal rate.  Pulmonary:     Effort: Pulmonary effort is normal.  Abdominal:     Tenderness: There is no abdominal tenderness.  Skin:    General: Skin is warm and dry.  Neurological:     Mental Status: He is alert and oriented to person, place, and time.  Psychiatric:        Mood and Affect: Mood normal.        Behavior: Behavior normal.        Thought Content: Thought content normal.  Judgment: Judgment normal.     Imaging: No results found.  Labs:  CBC: Recent Labs    10/28/22 2213  WBC 8.4  HGB 12.3*  HCT 37.7*  PLT 144*    COAGS: No results for input(s): "INR", "APTT" in the last 8760 hours.  BMP: Recent Labs    10/28/22 2213 04/01/23 1123  NA 137 138  K 3.7 4.0  CL 103 102  CO2 28 24  GLUCOSE 176* 205*  BUN 25* 23  CALCIUM 8.4* 8.7*  CREATININE 1.09 1.03  GFRNONAA >60 >60    LIVER FUNCTION TESTS: No results for input(s): "BILITOT", "AST", "ALT", "ALKPHOS", "PROT", "ALBUMIN" in the last 8760 hours.  TUMOR MARKERS: No results for input(s): "AFPTM", "CEA", "CA199", "CHROMGRNA" in the last 8760 hours.  Assessment and Plan:  78 year old male with a long-standing history of BPH with lower urinary tract symptoms. His quality of life is significantly impacted and he is interested in a non-surgical, minimally invasive treatment option. Dr. Archer Asa discussed prostate artery embolization at length. He discussed the procedural details, risks, benefits, peri-procedural expectations as well as long-term expectations.   The patient has expressed a desire to proceed. Dr. Archer Asa explained that the patient will need a CTA pelvis  to evaluate his vasculature. Barring any major negative findings the patient will be scheduled for prostate artery embolization with moderate sedation at [facility].   Thank you for this interesting consult.  I greatly enjoyed meeting Timmey F Desire Jr. and look forward to participating in their care.  A copy of this report was sent to the requesting provider on this date.  Electronically Signed: Mickie Kay, NP 04/29/2023, 11:22 AM   I spent a total of  40 Minutes   in face to face in clinical consultation, greater than 50% of which was counseling/coordinating care for benign prostatic hyperplasia with lower urinary tract symptoms.

## 2023-05-03 DIAGNOSIS — M79671 Pain in right foot: Secondary | ICD-10-CM | POA: Diagnosis not present

## 2023-05-03 DIAGNOSIS — M79675 Pain in left toe(s): Secondary | ICD-10-CM | POA: Diagnosis not present

## 2023-05-03 DIAGNOSIS — E1140 Type 2 diabetes mellitus with diabetic neuropathy, unspecified: Secondary | ICD-10-CM | POA: Diagnosis not present

## 2023-05-03 DIAGNOSIS — I739 Peripheral vascular disease, unspecified: Secondary | ICD-10-CM | POA: Diagnosis not present

## 2023-05-03 DIAGNOSIS — M79672 Pain in left foot: Secondary | ICD-10-CM | POA: Diagnosis not present

## 2023-05-03 DIAGNOSIS — M79674 Pain in right toe(s): Secondary | ICD-10-CM | POA: Diagnosis not present

## 2023-05-05 ENCOUNTER — Other Ambulatory Visit: Payer: Self-pay | Admitting: Interventional Radiology

## 2023-05-05 DIAGNOSIS — N138 Other obstructive and reflux uropathy: Secondary | ICD-10-CM

## 2023-05-12 ENCOUNTER — Ambulatory Visit
Admission: RE | Admit: 2023-05-12 | Discharge: 2023-05-12 | Disposition: A | Discharge status: Home / Self Care | Source: Ambulatory Visit | Attending: Interventional Radiology | Admitting: Interventional Radiology

## 2023-05-12 DIAGNOSIS — K5730 Diverticulosis of large intestine without perforation or abscess without bleeding: Secondary | ICD-10-CM | POA: Diagnosis not present

## 2023-05-12 DIAGNOSIS — N400 Enlarged prostate without lower urinary tract symptoms: Secondary | ICD-10-CM | POA: Diagnosis not present

## 2023-05-12 DIAGNOSIS — N401 Enlarged prostate with lower urinary tract symptoms: Secondary | ICD-10-CM

## 2023-05-12 DIAGNOSIS — N3289 Other specified disorders of bladder: Secondary | ICD-10-CM | POA: Diagnosis not present

## 2023-05-12 MED ORDER — IOPAMIDOL (ISOVUE-370) INJECTION 76%
100.0000 mL | Freq: Once | INTRAVENOUS | Status: AC | PRN
  Administered 2023-05-12: 100 mL via INTRAVENOUS

## 2023-05-19 DIAGNOSIS — I10 Essential (primary) hypertension: Secondary | ICD-10-CM | POA: Diagnosis not present

## 2023-05-19 DIAGNOSIS — E1169 Type 2 diabetes mellitus with other specified complication: Secondary | ICD-10-CM | POA: Diagnosis not present

## 2023-05-21 DIAGNOSIS — R809 Proteinuria, unspecified: Secondary | ICD-10-CM | POA: Diagnosis not present

## 2023-05-21 DIAGNOSIS — E1159 Type 2 diabetes mellitus with other circulatory complications: Secondary | ICD-10-CM | POA: Diagnosis not present

## 2023-05-21 DIAGNOSIS — E669 Obesity, unspecified: Secondary | ICD-10-CM | POA: Diagnosis not present

## 2023-05-21 DIAGNOSIS — E1151 Type 2 diabetes mellitus with diabetic peripheral angiopathy without gangrene: Secondary | ICD-10-CM | POA: Diagnosis not present

## 2023-05-21 DIAGNOSIS — D638 Anemia in other chronic diseases classified elsewhere: Secondary | ICD-10-CM | POA: Diagnosis not present

## 2023-05-21 DIAGNOSIS — I700 Atherosclerosis of aorta: Secondary | ICD-10-CM | POA: Diagnosis not present

## 2023-05-21 DIAGNOSIS — H409 Unspecified glaucoma: Secondary | ICD-10-CM | POA: Diagnosis not present

## 2023-05-21 DIAGNOSIS — M4802 Spinal stenosis, cervical region: Secondary | ICD-10-CM | POA: Diagnosis not present

## 2023-05-21 DIAGNOSIS — I10 Essential (primary) hypertension: Secondary | ICD-10-CM | POA: Diagnosis not present

## 2023-05-21 DIAGNOSIS — E1169 Type 2 diabetes mellitus with other specified complication: Secondary | ICD-10-CM | POA: Diagnosis not present

## 2023-05-21 DIAGNOSIS — E782 Mixed hyperlipidemia: Secondary | ICD-10-CM | POA: Diagnosis not present

## 2023-05-21 DIAGNOSIS — N401 Enlarged prostate with lower urinary tract symptoms: Secondary | ICD-10-CM | POA: Diagnosis not present

## 2023-05-27 MED ORDER — SOLIFENACIN SUCCINATE 5 MG PO TABS: 5.0000 mg | ORAL_TABLET | Freq: Every day | ORAL | 0 refills | Status: AC

## 2023-05-27 MED ORDER — CIPROFLOXACIN HCL 500 MG PO TABS: 500.0000 mg | ORAL_TABLET | Freq: Two times a day (BID) | ORAL | 0 refills | Status: AC

## 2023-05-27 MED ORDER — NAPROXEN 500 MG PO TABS: 500.0000 mg | ORAL_TABLET | Freq: Two times a day (BID) | ORAL | 0 refills | Status: AC

## 2023-05-27 MED ORDER — PHENAZOPYRIDINE HCL 100 MG PO TABS: 100.0000 mg | ORAL_TABLET | Freq: Three times a day (TID) | ORAL | 0 refills | Status: AC

## 2023-05-27 NOTE — H&P (Signed)
 Chief Complaint: BPH with LUTS - IR consulted for image guided prostate artery embolization  Referring Provider(s): McKenzie,Patrick L   Supervising Physician: Grant [name]  Patient Status: [facility] - Out-pt  History of Present Illness: Grant Yohey. is a 78 y.o. male with pmhx DM, HTN, prostate cancer and benign prostatic hyperplasia with lower urinary tract symptoms. Has been followed by Dr. Claretta Croft for many years due to the BPH with LUTS. Has complaints of straining and incomplete emptying with urination. Currently taking silodosin , finasterine, and alfuzosin  without significant relief of symptoms. Was seen in outpatient clinic with IR on 04/29/23 for consultation regarding PAE procedure, which patient has agreed to proceed with. Had CTA pelvis completed on 05/16/23. now presenting today for PAE to be completed.  CTA pelvis from 05/16/23: IMPRESSION: VASCULAR   1. Extensive calcified atherosclerotic plaque with at least moderate stenosis of the bilateral internal iliac arteries. 2. The operator arteries are replaced bilaterally to the inferior epigastric arteries (corona mortis variant). On the left, the artery appears occluded at the origin but reconstitutes via collateral flow as it crosses over the pubic ramus. 3. The right prostatic artery appears to arise from a common trunk with the internal pudendal artery. The left prostatic artery is not well seen. 4. 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 5.  Aortic Atherosclerosis (ICD10-I70.0).   NON-VASCULAR   1. Prostatomegaly.  The prostate gland measures approximately 96 g. 2. Bladder wall trabeculation consistent with chronic bladder outlet obstruction. 3. Colonic diverticular disease without CT evidence of active inflammation.   Patient is Full Code  Past Medical History:  Diagnosis Date   Cancer [facility])    prostate   Cervical stenosis of spine    Claudication (HCC)     Claudication (HCC)    Diabetes mellitus without complication (HCC)    GERD (gastroesophageal reflux disease)    Glaucoma (increased eye pressure)    Hyperlipidemia    Hypertension    Wears glasses     Past Surgical History:  Procedure Laterality Date   fracture back     HERNIA REPAIR     IR RADIOLOGIST EVAL & MGMT  04/29/2023   POSTERIOR CERVICAL FUSION/FORAMINOTOMY N/A 09/10/2017   Procedure: LAMINECTOMY CERVICAL FOUR - CERVICAL FIVE, CERVICAL FIVE - CERVICAL SIX, CERVICAL SIX - CERVICAL SEVEN, POSTERIOR SEGMENTAL INSTRUMENTATION, POSTERIOR LATERAL ARTHRODESIS;  Surgeon: Augusto Blonder, MD;  Location: [facility] OR;  Service: Neurosurgery;  Laterality: N/A;  LAMINECTOMY CERVICAL FOUR - CERVICAL FIVE, CERVICAL FIVE - CERVICAL SIX, CERVICAL SIX - CERVICAL SEVEN, POSTERIOR SEGMENTAL    Allergies: Morphine, Augmentin [amoxicillin-pot clavulanate], Hydrocodone , and Oxycontin  [oxycodone  hcl]  Medications: Prior to Admission medications   Medication Sig Start Date End Date Taking? Authorizing Provider  ciprofloxacin  (CIPRO ) 500 MG tablet Take 1 tablet (500 mg total) by mouth 2 (two) times daily for 7 days. 05/31/23 06/07/23 Yes Leaman Abe H, PA-C  naproxen  (NAPROSYN ) 500 MG tablet Take 1 tablet (500 mg total) by mouth 2 (two) times daily with a meal for 7 days. 05/31/23 06/07/23 Yes Redonna Wilbert H, PA-C  phenazopyridine  (PYRIDIUM ) 100 MG tablet Take 1 tablet (100 mg total) by mouth in the morning, at noon, and at bedtime for 7 days. 05/31/23 06/07/23 Yes Nadirah Socorro H, PA-C  solifenacin  (VESICARE ) 5 MG tablet Take 1 tablet (5 mg total) by mouth daily for 7 days. 05/31/23 06/07/23 Yes Nissim Fleischer H, PA-C  amLODipine  (NORVASC ) 5 MG tablet Take 1 tablet (5 mg  total) by mouth daily. Patient taking differently: Take 5 mg by mouth 2 (two) times daily. 02/04/21 04/20/23  Cantwell, Jennetta Modena, PA-C  aspirin  EC 325 MG tablet Take 650 mg by mouth every 6 (six) hours as needed (headaches).    [provider]  cilostazol  (PLETAL ) 50 MG tablet TAKE 1 TABLET BY MOUTH TWICE A DAY 11/28/18   Patwardhan, Manish J, MD  esomeprazole (NEXIUM) 20 MG capsule Take 20 mg by mouth daily as needed (acid reflux).    [provider]  finasteride  (PROSCAR ) 5 MG tablet Take 1 tablet (5 mg total) by mouth daily. 02/12/23   McKenzie, Arden Beck, MD  latanoprost (XALATAN) 0.005 % ophthalmic solution Place 1 drop into both eyes at bedtime. 01/07/21   [provider]  losartan  (COZAAR ) 100 MG tablet Take 100 mg by mouth daily.    [provider]  metFORMIN (GLUCOPHAGE) 500 MG tablet Take 500 mg by mouth at bedtime.    [provider]  Multiple Vitamins-Minerals (MULTIVITAMIN WITH MINERALS) tablet Take 1 tablet by mouth daily.    [provider]  [facility] ULTRA test strip 1 each daily. 09/12/19   [provider]  pantoprazole  (PROTONIX ) 40 MG tablet Take 40 mg by mouth at bedtime.    [provider]  Probiotic Product (ACIDOPHILUS HIGH-POTENCY) CAPS Take 1 capsule by mouth at bedtime. 09/10/22   [provider]  rosuvastatin  (CRESTOR ) 20 MG tablet Take 1 tablet (20 mg total) by mouth daily. 05/09/18   Patwardhan, Kaye Parsons, MD  silodosin  (RAPAFLO ) 8 MG CAPS capsule Take 1 capsule (8 mg total) by mouth 2 (two) times daily. 02/12/23   McKenzie, Arden Beck, MD  traMADol  (ULTRAM ) 50 MG tablet Take 1 tablet (50 mg total) by mouth every 6 (six) hours as needed. Patient not taking: Reported on 04/29/2023 04/05/23   Alanda Allegra, MD  TRESIBA FLEXTOUCH 100 UNIT/ML FlexTouch Pen Inject 46 Units into the skin daily. 07/07/21   [provider]     Family History  Problem Relation Age of Onset   Diabetes Mother    Lupus Mother    Prostate cancer Father     Social History   Socioeconomic History   Marital status: Married    Spouse name: Not on file   Number of children: Not on file   Years of education: Not on file   Highest education level:  Not on file  Occupational History   Not on file  Tobacco Use   Smoking status: Former    Current packs/day: 0.00    Types: Cigarettes    Quit date: 03/21/2014    Years since quitting: 9.2   Smokeless tobacco: Never  Vaping Use   Vaping status: Never Used  Substance and Sexual Activity   Alcohol use: Yes    Comment: occ   Drug use: No   Sexual activity: Never    Birth control/protection: None  Other Topics Concern   Not on file  Social History Narrative   Not on file   Social Drivers of Health   Financial Resource Strain: Not on file  Food Insecurity: Not on file  Transportation Needs: Not on file  Physical Activity: Not on file  Stress: Not on file  Social Connections: Not on file     Review of Systems: A 12 point ROS discussed and pertinent positives are indicated in the HPI above.  All other systems are negative.  Review of Systems  Genitourinary:  Positive for difficulty  urinating (chronic).    Vital Signs: BP (!) 173/87   Pulse 87   Temp 97.6 F (36.4 C) (Oral)   Resp 19   Ht 5\' 11"  (1.803 m)   Wt 215 lb 9.8 oz (97.8 kg)   SpO2 95%   BMI 30.07 kg/m   Advance Care Plan: No documents on file  Physical Exam Vitals and nursing note reviewed.  Constitutional:      Appearance: Normal appearance.  HENT:     Mouth/Throat:     Mouth: Mucous membranes are moist.     Pharynx: Oropharynx is clear.  Cardiovascular:     Rate and Rhythm: Normal rate and regular rhythm.  Pulmonary:     Effort: Pulmonary effort is normal.     Breath sounds: Normal breath sounds.  Abdominal:     Tenderness: There is no abdominal tenderness.     Comments: Mildly distended - chronic  Musculoskeletal:     Right lower leg: No edema.     Left lower leg: No edema.  Skin:    General: Skin is warm and dry.  Neurological:     Mental Status: He is alert and oriented to person, place, and time. Mental status is at baseline.     Imaging: CT ANGIO PELVIS W OR WO CONTRAST Result  Date: 05/16/2023 CLINICAL DATA:  78 year old male with benign prostatic hypertrophy with lower urinary tract symptoms. Preoperative evaluation prior to planned prostatic artery embolization. EXAM: CT ANGIOGRAPHY OF PELVIS TECHNIQUE: Multidetector CT imaging of the abdomen and pelvis was performed using the standard protocol during bolus administration of intravenous contrast. Multiplanar reconstructed images and MIPs were obtained and reviewed to evaluate the vascular anatomy. RADIATION DOSE REDUCTION: This exam was performed according to the departmental dose-optimization program which includes automated exposure control, adjustment of the mA and/or kV according to patient size and/or use of iterative reconstruction technique. CONTRAST:  100mL ISOVUE -370 IOPAMIDOL  (ISOVUE -370) INJECTION 76% COMPARISON:  None Available. FINDINGS: VASCULAR Aorta: Mild atherosclerotic plaque. No aneurysm or dissection in the visualized segment. IMA: Patent without evidence of aneurysm, dissection, vasculitis or significant stenosis. Inflow: Extensive calcified atherosclerotic plaque throughout the iliac systems. On the right, calcified plaque results in at least moderate stenosis of the origin of the internal iliac artery. No evidence of aneurysm or dissection. Diminutive anterior division. The right obturator artery is replaced to the inferior epigastric artery (corona mortis). The prostatic artery does not appear to arise from this replaced vessel. Prostatic artery appears to arise from a common trunk with the internal pudendal artery. On the left, there is mild ectasia of the distal common iliac artery to 1.6 cm. No aneurysm or dissection. Calcified atherosclerotic plaque results in at least moderate stenosis of the proximal internal iliac artery. Diminutive obturator artery, again appears replaced to the inferior epigastric artery but the origin may be occluded. There is filling via collateral branches. The left prostatic artery  is not well seen. Proximal Outflow: Calcified atherosclerotic plaque bilaterally. Moderate stenosis on the right with an associated non flow limiting dissection flap. Heavily calcified atherosclerotic plaque in the superficial femoral arteries with associated stenosis. Veins: No focal venous abnormality. Review of the MIP images confirms the above findings. NON-VASCULAR Stomach/Bowel: Colonic diverticular disease without CT evidence of active inflammation. Lymphatic: No suspicious lymphadenopathy. Reproductive: Prostatomegaly. The prostate gland measures 6.1 x 5.3 x 5.7 cm (volume = 96 cm^3). Other: Thick walled bladder consistent with trabeculation and chronic bladder outlet obstruction. No focal mass. Musculoskeletal: No acute fracture or aggressive  appearing lytic or blastic osseous lesion. Lower lumbar degenerative disc disease. IMPRESSION: VASCULAR 1. Extensive calcified atherosclerotic plaque with at least moderate stenosis of the bilateral internal iliac arteries. 2. The operator arteries are replaced bilaterally to the inferior epigastric arteries (corona mortis variant). On the left, the artery appears occluded at the origin but reconstitutes via collateral flow as it crosses over the pubic ramus. 3. The right prostatic artery appears to arise from a common trunk with the internal pudendal artery. The left prostatic artery is not well seen. 4. 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 5.  Aortic Atherosclerosis (ICD10-I70.0). NON-VASCULAR 1. Prostatomegaly.  The prostate gland measures approximately 96 g. 2. Bladder wall trabeculation consistent with chronic bladder outlet obstruction. 3. Colonic diverticular disease without CT evidence of active inflammation. Electronically Signed   By: Grant [name] M.D.   On: 05/16/2023 09:25    Labs:  CBC: Recent Labs    10/28/22 2213 05/31/23 0750  WBC 8.4 9.3  HGB 12.3* 12.6*  HCT 37.7* 38.5*  PLT 144* 184     COAGS: Recent Labs    05/31/23 0750  INR 1.1    BMP: Recent Labs    10/28/22 2213 04/01/23 1123 05/31/23 0750  NA 137 138 142  K 3.7 4.0 4.0  CL 103 102 108  CO2 28 24 25   GLUCOSE 176* 205* 133*  BUN 25* 23 30*  CALCIUM  8.4* 8.7* 9.0  CREATININE 1.09 1.03 1.23  GFRNONAA >60 >60 >60    LIVER FUNCTION TESTS: No results for input(s): "BILITOT", "AST", "ALT", "ALKPHOS", "PROT", "ALBUMIN" in the last 8760 hours.  TUMOR MARKERS: No results for input(s): "AFPTM", "CEA", "CA199", "CHROMGRNA" in the last 8760 hours.  Assessment and Plan:  [facility]. is a 78 y.o. male with pmhx DM, HTN, prostate cancer and benign prostatic hyperplasia with lower urinary tract symptoms. Has been followed by Dr. Claretta Croft for many years due to the BPH with LUTS. Has complaints of straining and incomplete empyting with urination. Currently taking silodosin , finasterine, and alfuzosin  without significant relief of symptoms. Was seen in outpatient clinic with IR on 04/29/23 for consultation regarding PAE procedure, which patient has agreed to proceed with, now presenting today for PAE to be completed.   The Risks and benefits of prostate artery embolization were discussed with the patient including, but not limited to bleeding, infection, vascular injury, post operative pain, or contrast induced renal failure.  This procedure involves the use of X-rays and because of the nature of the planned procedure, it is possible that we will have prolonged use of X-ray fluoroscopy.  Potential radiation risks to you include (but are not limited to) the following: - A slightly elevated risk for cancer several years later in life. This risk is typically less than 0.5% percent. This risk is low in comparison to the normal incidence of human cancer, which is 33% for women and 50% for men according to the [employer]. - Radiation induced injury can include skin redness, resembling a rash,  tissue breakdown / ulcers and hair loss (which can be temporary or permanent).   The likelihood of either of these occurring depends on the difficulty of the procedure and whether you are sensitive to radiation due to previous procedures, disease, or genetic conditions.   IF your procedure requires a prolonged use of radiation, you will be notified and given written instructions for further action.  It is your responsibility to monitor the irradiated  area for the 2 weeks following the procedure and to notify your physician if you are concerned that you have suffered a radiation induced injury.    All of the patient's questions were answered, patient is agreeable to proceed. Consent signed and in chart.   Thank you for allowing our service to participate in [place]. 's care.  Electronically Signed: Nicolasa Barrett, PA-C   05/31/2023, 8:31 AM      I spent a total of  30 Minutes   in face to face in clinical consultation, greater than 50% of which was counseling/coordinating care for prostate artery embolization.

## 2023-05-28 ENCOUNTER — Other Ambulatory Visit: Payer: Self-pay | Admitting: Radiology

## 2023-05-28 NOTE — Progress Notes (Signed)
 Patient for IR PAE on Monday 05/31/23, I called and spoke with the patient on the phone and gave pre-procedure instructions. Pt was made aware to be here at 7:30a, ASA 325, NPO after MN prior to procedure as well as driver post procedure/recovery/discharge. Pt stated understanding.  Called 05/28/23

## 2023-05-31 ENCOUNTER — Ambulatory Visit
Admission: RE | Admit: 2023-05-31 | Discharge: 2023-05-31 | Disposition: A | Discharge status: Home / Self Care | Source: Ambulatory Visit | Attending: Interventional Radiology | Admitting: Interventional Radiology

## 2023-05-31 ENCOUNTER — Encounter: Payer: Self-pay | Admitting: Radiology

## 2023-05-31 ENCOUNTER — Other Ambulatory Visit: Payer: Self-pay | Admitting: Interventional Radiology

## 2023-05-31 ENCOUNTER — Other Ambulatory Visit: Payer: Self-pay

## 2023-05-31 VITALS — BP 140/66 | HR 67 | Temp 97.5°F | Resp 19 | Ht 71.0 in | Wt 215.6 lb

## 2023-05-31 DIAGNOSIS — N138 Other obstructive and reflux uropathy: Secondary | ICD-10-CM | POA: Insufficient documentation

## 2023-05-31 DIAGNOSIS — Z8546 Personal history of malignant neoplasm of prostate: Secondary | ICD-10-CM | POA: Diagnosis not present

## 2023-05-31 DIAGNOSIS — N401 Enlarged prostate with lower urinary tract symptoms: Secondary | ICD-10-CM | POA: Diagnosis not present

## 2023-05-31 DIAGNOSIS — I10 Essential (primary) hypertension: Secondary | ICD-10-CM | POA: Diagnosis not present

## 2023-05-31 DIAGNOSIS — I708 Atherosclerosis of other arteries: Secondary | ICD-10-CM | POA: Diagnosis not present

## 2023-05-31 DIAGNOSIS — E119 Type 2 diabetes mellitus without complications: Secondary | ICD-10-CM | POA: Insufficient documentation

## 2023-05-31 DIAGNOSIS — Z79899 Other long term (current) drug therapy: Secondary | ICD-10-CM | POA: Diagnosis not present

## 2023-05-31 DIAGNOSIS — Z87891 Personal history of nicotine dependence: Secondary | ICD-10-CM | POA: Diagnosis not present

## 2023-05-31 HISTORY — PX: IR ANGIOGRAM PELVIS SELECTIVE OR SUPRASELECTIVE: IMG661

## 2023-05-31 LAB — PROTIME-INR
INR: 1.1 (ref 0.8–1.2)
Prothrombin Time: 14.3 s (ref 11.4–15.2)

## 2023-05-31 LAB — BASIC METABOLIC PANEL WITH GFR
Anion gap: 9 (ref 5–15)
BUN: 30 mg/dL — ABNORMAL HIGH (ref 8–23)
CO2: 25 mmol/L (ref 22–32)
Calcium: 9 mg/dL (ref 8.9–10.3)
Chloride: 108 mmol/L (ref 98–111)
Creatinine, Ser: 1.23 mg/dL (ref 0.61–1.24)
GFR, Estimated: >60 mL/min (ref >60)
Glucose, Bld: 133 mg/dL — ABNORMAL HIGH (ref 70–99)
Potassium: 4 mmol/L (ref 3.5–5.1)
Sodium: 142 mmol/L (ref 135–145)

## 2023-05-31 LAB — CBC WITH DIFFERENTIAL/PLATELET
Abs Immature Granulocytes: 0.04 10*3/uL (ref 0.00–0.07)
Basophils Absolute: 0 10*3/uL (ref 0.0–0.1)
Basophils Relative: 0 %
Eosinophils Absolute: 0.3 10*3/uL (ref 0.0–0.5)
Eosinophils Relative: 4 %
HCT: 38.5 % — ABNORMAL LOW (ref 39.0–52.0)
Hemoglobin: 12.6 g/dL — ABNORMAL LOW (ref 13.0–17.0)
Immature Granulocytes: 0 %
Lymphocytes Relative: 19 %
Lymphs Abs: 1.8 10*3/uL (ref 0.7–4.0)
MCH: 28.2 pg (ref 26.0–34.0)
MCHC: 32.7 g/dL (ref 30.0–36.0)
MCV: 86.1 fL (ref 80.0–100.0)
Monocytes Absolute: 0.7 10*3/uL (ref 0.1–1.0)
Monocytes Relative: 7 %
Neutro Abs: 6.4 10*3/uL (ref 1.7–7.7)
Neutrophils Relative %: 70 %
Platelets: 184 10*3/uL (ref 150–400)
RBC: 4.47 MIL/uL (ref 4.22–5.81)
RDW: 12.8 % (ref 11.5–15.5)
WBC: 9.3 10*3/uL (ref 4.0–10.5)
nRBC: 0 % (ref 0.0–0.2)

## 2023-05-31 LAB — GLUCOSE, CAPILLARY
Glucose-Capillary: 128 mg/dL — ABNORMAL HIGH (ref 70–99)
Glucose-Capillary: 99 mg/dL (ref 70–99)

## 2023-05-31 MED ORDER — FENTANYL CITRATE (PF) 100 MCG/2ML IJ SOLN
INTRAMUSCULAR | Status: AC
  Filled 2023-05-31: qty 2

## 2023-05-31 MED ORDER — CIPROFLOXACIN IN D5W 200 MG/100ML IV SOLN
INTRAVENOUS | Status: AC | PRN
  Administered 2023-05-31: 400 mg via INTRAVENOUS

## 2023-05-31 MED ORDER — VERAPAMIL HCL 2.5 MG/ML IV SOLN: Freq: Once | INTRA_ARTERIAL | Status: AC

## 2023-05-31 MED ORDER — VERAPAMIL HCL 2.5 MG/ML IV SOLN: Freq: Once | INTRAVENOUS | Status: AC

## 2023-05-31 MED ORDER — ACETAMINOPHEN 10 MG/ML IV SOLN
1000.0000 mg | INTRAVENOUS | Status: AC
  Filled 2023-05-31: qty 100

## 2023-05-31 MED ORDER — MIDAZOLAM HCL 5 MG/5ML IJ SOLN
INTRAMUSCULAR | Status: AC | PRN
  Administered 2023-05-31 (×3): .5 mg via INTRAVENOUS

## 2023-05-31 MED ORDER — SODIUM CHLORIDE 0.9 % IV SOLN: INTRAVENOUS | Status: DC

## 2023-05-31 MED ORDER — MIDAZOLAM HCL 2 MG/2ML IJ SOLN
INTRAMUSCULAR | Status: AC | PRN
  Administered 2023-05-31: 1 mg via INTRAVENOUS
  Administered 2023-05-31: .5 mg via INTRAVENOUS

## 2023-05-31 MED ORDER — IOHEXOL 300 MG/ML  SOLN
125.0000 mL | Freq: Once | INTRAMUSCULAR | Status: AC | PRN
Start: 2023-05-31 — End: 2023-05-31
  Administered 2023-05-31: 125 mL via INTRA_ARTERIAL

## 2023-05-31 MED ORDER — LIDOCAINE HCL 1 % IJ SOLN
INTRAMUSCULAR | Status: AC
  Filled 2023-05-31: qty 20

## 2023-05-31 MED ORDER — LIDOCAINE HCL 1 % IJ SOLN
5.0000 mL | Freq: Once | INTRAMUSCULAR | Status: AC
  Administered 2023-05-31: 5 mL via INTRADERMAL

## 2023-05-31 MED ORDER — NITROGLYCERIN 1 MG/10 ML FOR IR/CATH LAB
INTRA_ARTERIAL | Status: AC
  Filled 2023-05-31: qty 10

## 2023-05-31 MED ORDER — VERAPAMIL HCL 2.5 MG/ML IV SOLN
INTRAVENOUS | Status: AC
Start: 2023-05-31 — End: ?
  Filled 2023-05-31: qty 2

## 2023-05-31 MED ORDER — HEPARIN SODIUM (PORCINE) 1000 UNIT/ML IJ SOLN
INTRAMUSCULAR | Status: AC
  Filled 2023-05-31: qty 10

## 2023-05-31 MED ORDER — CIPROFLOXACIN IN D5W 400 MG/200ML IV SOLN
INTRAVENOUS | Status: AC
  Filled 2023-05-31: qty 200

## 2023-05-31 MED ORDER — CIPROFLOXACIN IN D5W 400 MG/200ML IV SOLN
400.0000 mg | INTRAVENOUS | Status: DC
  Filled 2023-05-31: qty 200

## 2023-05-31 MED ORDER — FENTANYL CITRATE (PF) 100 MCG/2ML IJ SOLN
INTRAMUSCULAR | Status: AC | PRN
  Administered 2023-05-31 (×2): 25 ug via INTRAVENOUS
  Administered 2023-05-31: 50 ug via INTRAVENOUS

## 2023-05-31 MED ORDER — MIDAZOLAM HCL 2 MG/2ML IJ SOLN
INTRAMUSCULAR | Status: AC
  Filled 2023-05-31: qty 2

## 2023-05-31 MED ORDER — ACETAMINOPHEN 10 MG/ML IV SOLN
INTRAVENOUS | Status: AC
  Administered 2023-05-31: 1000 mg via INTRAVENOUS
  Filled 2023-05-31: qty 100

## 2023-05-31 NOTE — Procedures (Signed)
 Interventional Radiology Procedure Note  Procedure: Unsuccessful attempted prostatic artery embolization.   Complications: None  Estimated Blood Loss: None  Recommendations: - TR band takedown - DC home   Signed,  Roxie Cord, MD

## 2023-05-31 NOTE — Progress Notes (Signed)
 Patient clinically stable post IR attempted prostate artery embolization per Dr Marne Sings, tolerated well. Received Versed 3 mg along with Fentanyl  100 mcg IV for procedure. Denies complaints post procedure. TR band in place to left radial artery without bleeding nor hematoma. Report given to [facility] Rn post procedure/specials/15.

## 2023-06-01 DIAGNOSIS — H18593 Other hereditary corneal dystrophies, bilateral: Secondary | ICD-10-CM | POA: Diagnosis not present

## 2023-06-01 DIAGNOSIS — H40023 Open angle with borderline findings, high risk, bilateral: Secondary | ICD-10-CM | POA: Diagnosis not present

## 2023-06-01 DIAGNOSIS — Z961 Presence of intraocular lens: Secondary | ICD-10-CM | POA: Diagnosis not present

## 2023-06-01 DIAGNOSIS — D3131 Benign neoplasm of right choroid: Secondary | ICD-10-CM | POA: Diagnosis not present

## 2023-06-01 DIAGNOSIS — E119 Type 2 diabetes mellitus without complications: Secondary | ICD-10-CM | POA: Diagnosis not present

## 2023-06-07 ENCOUNTER — Other Ambulatory Visit: Payer: Self-pay

## 2023-06-08 ENCOUNTER — Other Ambulatory Visit: Payer: Self-pay

## 2023-06-09 ENCOUNTER — Other Ambulatory Visit (HOSPITAL_COMMUNITY): Payer: Self-pay

## 2023-06-16 ENCOUNTER — Other Ambulatory Visit: Payer: Self-pay

## 2023-06-16 ENCOUNTER — Other Ambulatory Visit (HOSPITAL_COMMUNITY): Payer: Self-pay

## 2023-06-17 ENCOUNTER — Other Ambulatory Visit (HOSPITAL_COMMUNITY): Payer: Self-pay

## 2023-06-17 ENCOUNTER — Ambulatory Visit: Admitting: Urology

## 2023-06-17 ENCOUNTER — Other Ambulatory Visit: Payer: Self-pay

## 2023-06-17 ENCOUNTER — Encounter: Payer: Self-pay | Admitting: Urology

## 2023-06-17 VITALS — BP 117/66 | HR 57 | Temp 98.0°F

## 2023-06-17 DIAGNOSIS — N138 Other obstructive and reflux uropathy: Secondary | ICD-10-CM

## 2023-06-17 DIAGNOSIS — C61 Malignant neoplasm of prostate: Secondary | ICD-10-CM | POA: Diagnosis not present

## 2023-06-17 DIAGNOSIS — N401 Enlarged prostate with lower urinary tract symptoms: Secondary | ICD-10-CM | POA: Diagnosis not present

## 2023-06-17 LAB — BLADDER SCAN AMB NON-IMAGING: Scan Result: 96

## 2023-06-17 MED ORDER — SILODOSIN 8 MG PO CAPS: 8.0000 mg | ORAL_CAPSULE | Freq: Two times a day (BID) | ORAL | 11 refills | Status: DC

## 2023-06-17 MED ORDER — FINASTERIDE 5 MG PO TABS: 5.0000 mg | ORAL_TABLET | Freq: Every day | ORAL | 3 refills | Status: DC

## 2023-06-17 MED ORDER — TACROLIMUS 0.1 % EX OINT
TOPICAL_OINTMENT | CUTANEOUS | 3 refills | Status: AC
  Filled 2023-06-17: qty 60, 30d supply, fill #0

## 2023-06-17 NOTE — Progress Notes (Signed)
post void residual=96 

## 2023-06-17 NOTE — Progress Notes (Signed)
 Name: Grant [name] [facility]. DOB: 1945/05/18 MRN: [id number]  History of Present Illness: Grant [name] is a 78 y.o. male who presents today for follow up visit at [facility].  GU History includes: 1. Prostate cancer; on active surveillance. - 01/16/2015: Prostate biopsy pathology showed small focus of prostatic adenocarcinoma, Gleason score 3+3=6 (Grade group 1) involving 5% of one core. - 07/31/2015: Prostate biopsy pathology showed atypical small acinar proliferation (ASAP) in one core. 2. Prostatomegaly with BOO & LUTS (nocturia, intermittent weak urinary stream, intermittent dribbling, incomplete bladder emptying).  PSA values: - 05/23/2020: 10.7 - 11/21/2020: 10.9 - 07/23/2021: 5.7 - 02/02/2022: 5.7 - 08/04/2022: 5.6 - 02/05/2023: 4.7  At last visit with Dr. Claretta Croft on 02/12/2023: - PVR = 125 ml. - The plan was: 1. Continue rapaflo  8mg  BID and finasteride  2. Followup 6 months with PSA  Since last visit: > 05/12/2023: CT angiogram of pelvis showed: - Prostate volume = 96 cc.  - Bladder wall trabeculation consistent with chronic bladder outlet obstruction. No focal mass.  > 05/31/2023: Failed prostatic artery embolization procedure due to "extensive atherosclerotic calcification and mildly variant anatomy which ultimately prohibited catheterization of either prostatic artery."  Today: He denies any complications following attempted PAE. States that his urinary symptoms are stable / unchanged compared to prior.  Medications: Current Outpatient Medications  Medication Sig Dispense Refill   amLODipine  (NORVASC ) 5 MG tablet Take 1 tablet (5 mg total) by mouth daily. (Patient taking differently: Take 5 mg by mouth 2 (two) times daily.) 30 tablet 3   aspirin  EC 325 MG tablet Take 650 mg by mouth every 6 (six) hours as needed (headaches).     cilostazol  (PLETAL ) 50 MG tablet TAKE 1 TABLET BY MOUTH TWICE A DAY 180 tablet 1   esomeprazole (NEXIUM) 20 MG  capsule Take 20 mg by mouth daily as needed (acid reflux).     finasteride  (PROSCAR ) 5 MG tablet Take 1 tablet (5 mg total) by mouth daily. 90 tablet 3   latanoprost (XALATAN) 0.005 % ophthalmic solution Place 1 drop into both eyes at bedtime.     losartan  (COZAAR ) 100 MG tablet Take 100 mg by mouth daily.     metFORMIN (GLUCOPHAGE) 500 MG tablet Take 500 mg by mouth at bedtime.     Multiple Vitamins-Minerals (MULTIVITAMIN WITH MINERALS) tablet Take 1 tablet by mouth daily.     ONETOUCH ULTRA test strip 1 each daily.     pantoprazole  (PROTONIX ) 40 MG tablet Take 40 mg by mouth at bedtime.     Probiotic Product (ACIDOPHILUS HIGH-POTENCY) CAPS Take 1 capsule by mouth at bedtime.     rosuvastatin  (CRESTOR ) 20 MG tablet Take 1 tablet (20 mg total) by mouth daily. 90 tablet 3   silodosin  (RAPAFLO ) 8 MG CAPS capsule Take 1 capsule (8 mg total) by mouth 2 (two) times daily. 60 capsule 11   tacrolimus (PROTOPIC) 0.1 % ointment Apply to itchy areas on entire body twice daily. 60 g 3   traMADol  (ULTRAM ) 50 MG tablet Take 1 tablet (50 mg total) by mouth every 6 (six) hours as needed. (Patient not taking: Reported on 04/29/2023) 20 tablet 0   TRESIBA FLEXTOUCH 100 UNIT/ML FlexTouch Pen Inject 46 Units into the skin daily.     No current facility-administered medications for this visit.    Allergies: Allergies  Allergen Reactions   Morphine Other (See Comments)    Confusion, aggression, hallucinations    Augmentin [Amoxicillin-Pot Clavulanate] Nausea And Vomiting  Hydrocodone  Other (See Comments)    " I get goofy and crazy thoughts. "   Oxycontin  [Oxycodone  Hcl] Other (See Comments)    " I couldn't stay still; I was walking around in circles " Pt has tolerated Percocet this medication 08/2017 admssion    Past Medical History:  Diagnosis Date   Cancer [facility])    prostate   Cervical stenosis of spine    Claudication (HCC)    Claudication (HCC)    Diabetes mellitus without complication (HCC)     GERD (gastroesophageal reflux disease)    Glaucoma (increased eye pressure)    Hyperlipidemia    Hypertension    Wears glasses    Past Surgical History:  Procedure Laterality Date   fracture back     HERNIA REPAIR     IR ANGIOGRAM PELVIS SELECTIVE OR SUPRASELECTIVE  05/31/2023   IR RADIOLOGIST EVAL & MGMT  04/29/2023   POSTERIOR CERVICAL FUSION/FORAMINOTOMY N/A 09/10/2017   Procedure: LAMINECTOMY CERVICAL FOUR - CERVICAL FIVE, CERVICAL FIVE - CERVICAL SIX, CERVICAL SIX - CERVICAL SEVEN, POSTERIOR SEGMENTAL INSTRUMENTATION, POSTERIOR LATERAL ARTHRODESIS;  Surgeon: Augusto Blonder, MD;  Location: [facility] OR;  Service: Neurosurgery;  Laterality: N/A;  LAMINECTOMY CERVICAL FOUR - CERVICAL FIVE, CERVICAL FIVE - CERVICAL SIX, CERVICAL SIX - CERVICAL SEVEN, POSTERIOR SEGMENTAL   Family History  Problem Relation Age of Onset   Diabetes Mother    Lupus Mother    Prostate cancer Father    Social History   Socioeconomic History   Marital status: Married    Spouse name: Not on file   Number of children: Not on file   Years of education: Not on file   Highest education level: Not on file  Occupational History   Not on file  Tobacco Use   Smoking status: Former    Current packs/day: 0.00    Types: Cigarettes    Quit date: 03/21/2014    Years since quitting: 9.2   Smokeless tobacco: Never  Vaping Use   Vaping status: Never Used  Substance and Sexual Activity   Alcohol use: Yes    Comment: occ   Drug use: No   Sexual activity: Never    Birth control/protection: None  Other Topics Concern   Not on file  Social History Narrative   Not on file   Social Drivers of Health   Financial Resource Strain: Not on file  Food Insecurity: Not on file  Transportation Needs: Not on file  Physical Activity: Not on file  Stress: Not on file  Social Connections: Not on file  Intimate Partner Violence: Not on file    Review of Systems Constitutional: Patient denies any unintentional weight loss  or change in strength lntegumentary: Patient denies any rashes or pruritus Cardiovascular: Patient denies chest pain or syncope Respiratory: Patient denies shortness of breath Musculoskeletal: Patient denies muscle cramps or weakness Neurologic: Patient denies convulsions or seizures Allergic/Immunologic: Patient denies recent allergic reaction(s) Hematologic/Lymphatic: Patient denies bleeding tendencies Endocrine: Patient denies heat/cold intolerance  GU: As per HPI.  OBJECTIVE Vitals:   06/17/23 1622  BP: 117/66  Pulse: (!) 57  Temp: 98 F (36.7 C)   There is no height or weight on file to calculate BMI.  Physical Examination Constitutional: No obvious distress; patient is non-toxic appearing  Cardiovascular: No visible lower extremity edema.  Respiratory: The patient does not have audible wheezing/stridor; respirations do not appear labored  Gastrointestinal: Abdomen non-distended Musculoskeletal: Normal ROM of UEs  Skin: No obvious rashes/open sores  Neurologic:  CN 2-12 grossly intact Psychiatric: Answered questions appropriately with normal affect  Hematologic/Lymphatic/Immunologic: No obvious bruises or sites of spontaneous bleeding  UA: Patient declined  PVR: 96 ml  ASSESSMENT Malignant neoplasm of prostate (HCC) - Plan: silodosin  (RAPAFLO ) 8 MG CAPS capsule, finasteride  (PROSCAR ) 5 MG tablet, PSA, total and free, PSA  Benign prostatic hyperplasia with urinary obstruction - Plan: Urinalysis, Routine w reflex microscopic, BLADDER SCAN AMB NON-IMAGING, silodosin  (RAPAFLO ) 8 MG CAPS capsule, finasteride  (PROSCAR ) 5 MG tablet  Stable. Will recheck PSA today and again at follow up in 6 months. Refills sent. Patient verbalized understanding of and agreement with current plan. All questions were answered.  PLAN Advised the following: 1. PSA today. 2. Continue Rapaflo  (Silodosin ) 8 mg twice per day. 3. Continue Proscar  (Finasteride ) 5 mg daily. 4. Return in about 6  months (around 12/18/2023) for f/u with Dr. Claretta Croft w/ PSA prior.  Orders Placed This Encounter  Procedures   Urinalysis, Routine w reflex microscopic   PSA, total and free    Standing Status:   Future    Expected Date:   12/18/2023    Expiration Date:   06/16/2024   BLADDER SCAN AMB NON-IMAGING    It has been explained that the patient is to follow regularly with their PCP in addition to all other providers involved in their care and to follow instructions provided by these respective offices. Patient advised to contact urology clinic if any urologic-pertaining questions, concerns, new symptoms or problems arise in the interim period.  There are no Patient Instructions on file for this visit.  Electronically signed by:  Lauretta Ponto, FNP   06/17/23    4:48 PM

## 2023-06-18 ENCOUNTER — Other Ambulatory Visit: Payer: Self-pay

## 2023-06-19 LAB — PSA: Prostate Specific Ag, Serum: 4 ng/mL (ref 0.0–4.0)

## 2023-06-21 ENCOUNTER — Other Ambulatory Visit: Payer: Self-pay

## 2023-06-22 ENCOUNTER — Ambulatory Visit: Payer: Self-pay

## 2023-06-22 ENCOUNTER — Other Ambulatory Visit: Payer: Self-pay

## 2023-06-24 ENCOUNTER — Other Ambulatory Visit: Payer: Self-pay | Admitting: Interventional Radiology

## 2023-06-24 DIAGNOSIS — N401 Enlarged prostate with lower urinary tract symptoms: Secondary | ICD-10-CM

## 2023-06-25 ENCOUNTER — Other Ambulatory Visit: Payer: Self-pay

## 2023-06-25 ENCOUNTER — Other Ambulatory Visit (HOSPITAL_COMMUNITY): Payer: Self-pay

## 2023-06-25 ENCOUNTER — Encounter: Payer: Self-pay | Admitting: Pharmacist

## 2023-06-25 MED ORDER — TACROLIMUS 0.1 % EX OINT
1.0000 | TOPICAL_OINTMENT | Freq: Two times a day (BID) | CUTANEOUS | 3 refills | Status: AC
  Filled 2023-06-25: qty 60, 30d supply, fill #0

## 2023-06-28 ENCOUNTER — Other Ambulatory Visit: Payer: Self-pay

## 2023-06-28 ENCOUNTER — Other Ambulatory Visit (HOSPITAL_COMMUNITY): Payer: Self-pay

## 2023-06-28 MED ORDER — SILODOSIN 8 MG PO CAPS
8.0000 mg | ORAL_CAPSULE | Freq: Two times a day (BID) | ORAL | 5 refills | Status: DC
  Filled 2023-06-28 – 2023-07-01 (×3): qty 60, 30d supply, fill #0
  Filled 2023-08-02: qty 60, 30d supply, fill #1
  Filled 2023-08-27: qty 60, 30d supply, fill #2
  Filled 2023-10-05: qty 60, 30d supply, fill #3

## 2023-06-28 MED ORDER — AMLODIPINE BESYLATE 5 MG PO TABS
5.0000 mg | ORAL_TABLET | Freq: Two times a day (BID) | ORAL | 2 refills | Status: AC
  Filled 2023-06-28 – 2023-06-29 (×2): qty 180, 90d supply, fill #0
  Filled 2023-07-01: qty 60, 30d supply, fill #0
  Filled 2023-08-02: qty 60, 30d supply, fill #1

## 2023-06-28 MED ORDER — GLUCOSE BLOOD VI STRP: ORAL_STRIP | 3 refills | Status: AC

## 2023-06-28 MED ORDER — CILOSTAZOL 50 MG PO TABS
50.0000 mg | ORAL_TABLET | Freq: Two times a day (BID) | ORAL | 5 refills | Status: DC
  Filled 2023-06-28 – 2023-07-01 (×3): qty 60, 30d supply, fill #0
  Filled 2023-08-02: qty 60, 30d supply, fill #1
  Filled 2023-08-27: qty 60, 30d supply, fill #2
  Filled 2023-10-05: qty 60, 30d supply, fill #3
  Filled 2023-11-03 – 2023-11-15 (×3): qty 60, 30d supply, fill #4
  Filled 2023-11-29 – 2023-12-13 (×4): qty 60, 30d supply, fill #5

## 2023-06-28 MED ORDER — AMLODIPINE BESYLATE 5 MG PO TABS
5.0000 mg | ORAL_TABLET | Freq: Two times a day (BID) | ORAL | 5 refills | Status: AC
  Filled 2023-06-28 – 2023-08-27 (×4): qty 60, 30d supply, fill #0
  Filled 2023-10-05: qty 60, 30d supply, fill #1
  Filled 2023-11-03 – 2023-11-15 (×3): qty 60, 30d supply, fill #2
  Filled 2023-11-29 – 2023-12-13 (×4): qty 60, 30d supply, fill #3
  Filled 2024-01-04: qty 60, 30d supply, fill #4
  Filled 2024-02-06: qty 60, 30d supply, fill #5

## 2023-06-28 MED ORDER — PANTOPRAZOLE SODIUM 40 MG PO TBEC
40.0000 mg | DELAYED_RELEASE_TABLET | Freq: Every day | ORAL | 5 refills | Status: AC
  Filled 2023-06-28 – 2023-06-29 (×2): qty 90, 90d supply, fill #0
  Filled 2023-07-01: qty 30, 30d supply, fill #0
  Filled 2023-08-02: qty 30, 30d supply, fill #1

## 2023-06-29 ENCOUNTER — Other Ambulatory Visit: Payer: Self-pay

## 2023-06-29 ENCOUNTER — Other Ambulatory Visit (HOSPITAL_COMMUNITY): Payer: Self-pay

## 2023-06-29 MED ORDER — METFORMIN HCL 500 MG PO TABS
500.0000 mg | ORAL_TABLET | Freq: Every day | ORAL | 5 refills | Status: DC
  Filled 2023-06-29 – 2023-07-01 (×3): qty 30, 30d supply, fill #0
  Filled 2023-08-02: qty 30, 30d supply, fill #1
  Filled 2023-08-27: qty 30, 30d supply, fill #2
  Filled 2023-10-05: qty 30, 30d supply, fill #3
  Filled 2023-11-03 – 2023-11-15 (×3): qty 30, 30d supply, fill #4
  Filled 2023-11-29 – 2023-12-13 (×4): qty 30, 30d supply, fill #5

## 2023-06-29 MED ORDER — TRESIBA FLEXTOUCH 100 UNIT/ML ~~LOC~~ SOPN
40.0000 [IU] | PEN_INJECTOR | Freq: Every day | SUBCUTANEOUS | 5 refills | Status: DC
  Filled 2023-06-29 – 2023-07-01 (×3): qty 12, 30d supply, fill #0
  Filled 2023-08-02: qty 12, 30d supply, fill #1
  Filled 2023-08-30: qty 12, 30d supply, fill #2
  Filled 2023-09-22: qty 12, 30d supply, fill #3
  Filled 2023-10-28: qty 12, 30d supply, fill #4
  Filled 2023-11-23: qty 12, 30d supply, fill #5

## 2023-06-29 MED ORDER — LOSARTAN POTASSIUM 100 MG PO TABS
100.0000 mg | ORAL_TABLET | Freq: Every day | ORAL | 5 refills | Status: DC
  Filled 2023-06-29 – 2023-07-01 (×3): qty 30, 30d supply, fill #0
  Filled 2023-08-02: qty 30, 30d supply, fill #1
  Filled 2023-08-27: qty 30, 30d supply, fill #2
  Filled 2023-10-05: qty 30, 30d supply, fill #3
  Filled 2023-11-03 – 2023-11-15 (×3): qty 30, 30d supply, fill #4
  Filled 2023-11-29 – 2023-12-13 (×4): qty 30, 30d supply, fill #5

## 2023-06-29 MED ORDER — PANTOPRAZOLE SODIUM 40 MG PO TBEC
40.0000 mg | DELAYED_RELEASE_TABLET | Freq: Every day | ORAL | 5 refills | Status: AC
  Filled 2023-06-29 – 2023-08-27 (×4): qty 30, 30d supply, fill #0
  Filled 2023-10-05: qty 30, 30d supply, fill #1
  Filled 2023-11-03 – 2023-11-15 (×3): qty 30, 30d supply, fill #2
  Filled 2023-11-29 – 2023-12-13 (×4): qty 30, 30d supply, fill #3
  Filled 2024-01-04: qty 30, 30d supply, fill #4
  Filled 2024-02-06: qty 30, 30d supply, fill #5

## 2023-06-30 ENCOUNTER — Other Ambulatory Visit: Payer: Self-pay

## 2023-06-30 ENCOUNTER — Other Ambulatory Visit (HOSPITAL_COMMUNITY): Payer: Self-pay

## 2023-06-30 ENCOUNTER — Other Ambulatory Visit: Payer: Self-pay | Admitting: Urology

## 2023-06-30 DIAGNOSIS — C61 Malignant neoplasm of prostate: Secondary | ICD-10-CM

## 2023-06-30 DIAGNOSIS — N401 Enlarged prostate with lower urinary tract symptoms: Secondary | ICD-10-CM

## 2023-07-01 ENCOUNTER — Other Ambulatory Visit: Payer: Self-pay

## 2023-07-01 ENCOUNTER — Other Ambulatory Visit (HOSPITAL_COMMUNITY): Payer: Self-pay

## 2023-07-01 MED ORDER — LATANOPROST 0.005 % OP SOLN
1.0000 [drp] | Freq: Every evening | OPHTHALMIC | 3 refills | Status: AC
  Filled 2023-07-01: qty 7.5, 150d supply, fill #0
  Filled 2023-08-02: qty 5, 50d supply, fill #0
  Filled 2023-09-22: qty 5, 50d supply, fill #1

## 2023-07-05 ENCOUNTER — Other Ambulatory Visit: Payer: Self-pay

## 2023-07-05 ENCOUNTER — Encounter: Payer: Self-pay | Admitting: Pharmacist

## 2023-07-06 ENCOUNTER — Other Ambulatory Visit: Payer: Self-pay

## 2023-07-06 ENCOUNTER — Encounter: Payer: Self-pay | Admitting: Pharmacist

## 2023-07-06 ENCOUNTER — Other Ambulatory Visit (HOSPITAL_COMMUNITY): Payer: Self-pay

## 2023-07-06 MED ORDER — FINASTERIDE 5 MG PO TABS
5.0000 mg | ORAL_TABLET | Freq: Every day | ORAL | 3 refills | Status: DC
  Filled 2023-07-06: qty 90, 90d supply, fill #0
  Filled 2023-10-05: qty 90, 90d supply, fill #1

## 2023-07-07 ENCOUNTER — Encounter: Payer: Self-pay | Admitting: Pharmacist

## 2023-07-07 ENCOUNTER — Other Ambulatory Visit (HOSPITAL_COMMUNITY): Payer: Self-pay

## 2023-07-07 ENCOUNTER — Other Ambulatory Visit: Payer: Self-pay

## 2023-07-08 ENCOUNTER — Other Ambulatory Visit: Payer: Self-pay

## 2023-07-12 ENCOUNTER — Other Ambulatory Visit: Payer: Self-pay

## 2023-07-12 DIAGNOSIS — E1140 Type 2 diabetes mellitus with diabetic neuropathy, unspecified: Secondary | ICD-10-CM | POA: Diagnosis not present

## 2023-07-12 DIAGNOSIS — M79674 Pain in right toe(s): Secondary | ICD-10-CM | POA: Diagnosis not present

## 2023-07-12 DIAGNOSIS — M79672 Pain in left foot: Secondary | ICD-10-CM | POA: Diagnosis not present

## 2023-07-12 DIAGNOSIS — L255 Unspecified contact dermatitis due to plants, except food: Secondary | ICD-10-CM | POA: Diagnosis not present

## 2023-07-12 DIAGNOSIS — I739 Peripheral vascular disease, unspecified: Secondary | ICD-10-CM | POA: Diagnosis not present

## 2023-07-12 DIAGNOSIS — M79675 Pain in left toe(s): Secondary | ICD-10-CM | POA: Diagnosis not present

## 2023-07-12 DIAGNOSIS — M79671 Pain in right foot: Secondary | ICD-10-CM | POA: Diagnosis not present

## 2023-08-02 ENCOUNTER — Other Ambulatory Visit: Payer: Self-pay

## 2023-08-03 ENCOUNTER — Other Ambulatory Visit: Payer: Self-pay

## 2023-08-04 ENCOUNTER — Other Ambulatory Visit (HOSPITAL_COMMUNITY): Payer: Self-pay

## 2023-08-05 ENCOUNTER — Other Ambulatory Visit: Payer: Medicare HMO

## 2023-08-06 ENCOUNTER — Other Ambulatory Visit: Payer: Medicare HMO

## 2023-08-07 ENCOUNTER — Other Ambulatory Visit (HOSPITAL_COMMUNITY): Payer: Self-pay

## 2023-08-07 MED ORDER — ACIDOPHILUS PO CAPS
1.0000 | ORAL_CAPSULE | Freq: Every day | ORAL | 11 refills | Status: AC
  Filled 2023-08-07 – 2023-08-27 (×2): qty 30, 30d supply, fill #0
  Filled 2023-10-05: qty 30, 30d supply, fill #1
  Filled 2023-11-03 – 2023-11-15 (×3): qty 30, 30d supply, fill #2
  Filled 2023-11-29 – 2023-12-13 (×4): qty 30, 30d supply, fill #3
  Filled 2024-01-04: qty 30, 30d supply, fill #4

## 2023-08-07 MED ORDER — PX COMPLETE SENIOR MULTIVITS PO TABS
1.0000 | ORAL_TABLET | Freq: Every day | ORAL | 11 refills | Status: AC
  Filled 2023-08-07: qty 90, 90d supply, fill #0
  Filled 2023-08-27: qty 30, 30d supply, fill #0
  Filled 2023-10-05: qty 30, 30d supply, fill #1
  Filled 2023-11-03 – 2023-11-15 (×3): qty 30, 30d supply, fill #2
  Filled 2023-11-29 – 2023-12-13 (×4): qty 30, 30d supply, fill #3
  Filled 2024-01-04: qty 30, 30d supply, fill #4
  Filled 2024-02-06: qty 30, 30d supply, fill #5

## 2023-08-09 ENCOUNTER — Other Ambulatory Visit (HOSPITAL_COMMUNITY): Payer: Self-pay

## 2023-08-09 ENCOUNTER — Other Ambulatory Visit: Payer: Self-pay

## 2023-08-12 ENCOUNTER — Other Ambulatory Visit: Payer: Self-pay

## 2023-08-13 ENCOUNTER — Ambulatory Visit: Payer: Medicare HMO | Admitting: Urology

## 2023-08-27 ENCOUNTER — Other Ambulatory Visit: Payer: Self-pay

## 2023-08-30 ENCOUNTER — Other Ambulatory Visit: Payer: Self-pay

## 2023-08-31 ENCOUNTER — Other Ambulatory Visit: Payer: Self-pay

## 2023-09-02 ENCOUNTER — Other Ambulatory Visit: Payer: Self-pay

## 2023-09-17 DIAGNOSIS — E1169 Type 2 diabetes mellitus with other specified complication: Secondary | ICD-10-CM | POA: Diagnosis not present

## 2023-09-17 DIAGNOSIS — I10 Essential (primary) hypertension: Secondary | ICD-10-CM | POA: Diagnosis not present

## 2023-09-21 DIAGNOSIS — I739 Peripheral vascular disease, unspecified: Secondary | ICD-10-CM | POA: Diagnosis not present

## 2023-09-21 DIAGNOSIS — M79672 Pain in left foot: Secondary | ICD-10-CM | POA: Diagnosis not present

## 2023-09-21 DIAGNOSIS — M79671 Pain in right foot: Secondary | ICD-10-CM | POA: Diagnosis not present

## 2023-09-21 DIAGNOSIS — M79675 Pain in left toe(s): Secondary | ICD-10-CM | POA: Diagnosis not present

## 2023-09-21 DIAGNOSIS — M79674 Pain in right toe(s): Secondary | ICD-10-CM | POA: Diagnosis not present

## 2023-09-21 DIAGNOSIS — E1140 Type 2 diabetes mellitus with diabetic neuropathy, unspecified: Secondary | ICD-10-CM | POA: Diagnosis not present

## 2023-09-22 ENCOUNTER — Other Ambulatory Visit: Payer: Self-pay

## 2023-09-22 DIAGNOSIS — H409 Unspecified glaucoma: Secondary | ICD-10-CM | POA: Diagnosis not present

## 2023-09-22 DIAGNOSIS — N401 Enlarged prostate with lower urinary tract symptoms: Secondary | ICD-10-CM | POA: Diagnosis not present

## 2023-09-22 DIAGNOSIS — Z8546 Personal history of malignant neoplasm of prostate: Secondary | ICD-10-CM | POA: Diagnosis not present

## 2023-09-22 DIAGNOSIS — M4802 Spinal stenosis, cervical region: Secondary | ICD-10-CM | POA: Diagnosis not present

## 2023-09-22 DIAGNOSIS — D638 Anemia in other chronic diseases classified elsewhere: Secondary | ICD-10-CM | POA: Diagnosis not present

## 2023-09-22 DIAGNOSIS — E1165 Type 2 diabetes mellitus with hyperglycemia: Secondary | ICD-10-CM | POA: Diagnosis not present

## 2023-09-22 DIAGNOSIS — R809 Proteinuria, unspecified: Secondary | ICD-10-CM | POA: Diagnosis not present

## 2023-09-22 DIAGNOSIS — E782 Mixed hyperlipidemia: Secondary | ICD-10-CM | POA: Diagnosis not present

## 2023-09-22 DIAGNOSIS — E1169 Type 2 diabetes mellitus with other specified complication: Secondary | ICD-10-CM | POA: Diagnosis not present

## 2023-09-22 DIAGNOSIS — I10 Essential (primary) hypertension: Secondary | ICD-10-CM | POA: Diagnosis not present

## 2023-09-22 DIAGNOSIS — Z23 Encounter for immunization: Secondary | ICD-10-CM | POA: Diagnosis not present

## 2023-09-22 DIAGNOSIS — I700 Atherosclerosis of aorta: Secondary | ICD-10-CM | POA: Diagnosis not present

## 2023-09-24 ENCOUNTER — Other Ambulatory Visit (HOSPITAL_COMMUNITY): Payer: Self-pay

## 2023-09-24 ENCOUNTER — Other Ambulatory Visit: Payer: Self-pay

## 2023-10-01 ENCOUNTER — Other Ambulatory Visit: Payer: Self-pay

## 2023-10-05 ENCOUNTER — Other Ambulatory Visit: Payer: Self-pay

## 2023-10-06 ENCOUNTER — Other Ambulatory Visit: Payer: Self-pay

## 2023-10-28 ENCOUNTER — Other Ambulatory Visit: Payer: Self-pay | Admitting: Urology

## 2023-10-28 ENCOUNTER — Other Ambulatory Visit: Payer: Self-pay

## 2023-10-28 ENCOUNTER — Other Ambulatory Visit (HOSPITAL_COMMUNITY): Payer: Self-pay

## 2023-11-03 ENCOUNTER — Other Ambulatory Visit: Payer: Self-pay

## 2023-11-03 ENCOUNTER — Other Ambulatory Visit (HOSPITAL_COMMUNITY): Payer: Self-pay

## 2023-11-04 ENCOUNTER — Other Ambulatory Visit: Payer: Self-pay

## 2023-11-09 ENCOUNTER — Other Ambulatory Visit: Payer: Self-pay

## 2023-11-11 ENCOUNTER — Other Ambulatory Visit: Payer: Self-pay

## 2023-11-12 ENCOUNTER — Other Ambulatory Visit: Payer: Self-pay

## 2023-11-15 ENCOUNTER — Other Ambulatory Visit (HOSPITAL_BASED_OUTPATIENT_CLINIC_OR_DEPARTMENT_OTHER): Payer: Self-pay

## 2023-11-15 ENCOUNTER — Other Ambulatory Visit (HOSPITAL_COMMUNITY): Payer: Self-pay

## 2023-11-15 ENCOUNTER — Other Ambulatory Visit: Payer: Self-pay | Admitting: Urology

## 2023-11-15 ENCOUNTER — Other Ambulatory Visit: Payer: Self-pay

## 2023-11-16 ENCOUNTER — Other Ambulatory Visit: Payer: Self-pay

## 2023-11-16 MED ORDER — SILODOSIN 8 MG PO CAPS
8.0000 mg | ORAL_CAPSULE | Freq: Two times a day (BID) | ORAL | 5 refills | Status: AC
  Filled 2023-11-16: qty 60, 30d supply, fill #0
  Filled 2023-11-29 – 2023-12-13 (×4): qty 60, 30d supply, fill #1
  Filled 2024-01-04: qty 60, 30d supply, fill #2
  Filled 2024-02-06: qty 60, 30d supply, fill #3

## 2023-11-23 ENCOUNTER — Other Ambulatory Visit (HOSPITAL_COMMUNITY): Payer: Self-pay

## 2023-11-24 ENCOUNTER — Other Ambulatory Visit (HOSPITAL_BASED_OUTPATIENT_CLINIC_OR_DEPARTMENT_OTHER): Payer: Self-pay

## 2023-11-24 ENCOUNTER — Other Ambulatory Visit: Payer: Self-pay

## 2023-11-29 ENCOUNTER — Other Ambulatory Visit: Payer: Self-pay

## 2023-11-30 DIAGNOSIS — M79675 Pain in left toe(s): Secondary | ICD-10-CM | POA: Diagnosis not present

## 2023-11-30 DIAGNOSIS — E1140 Type 2 diabetes mellitus with diabetic neuropathy, unspecified: Secondary | ICD-10-CM | POA: Diagnosis not present

## 2023-11-30 DIAGNOSIS — I739 Peripheral vascular disease, unspecified: Secondary | ICD-10-CM | POA: Diagnosis not present

## 2023-11-30 DIAGNOSIS — M79674 Pain in right toe(s): Secondary | ICD-10-CM | POA: Diagnosis not present

## 2023-11-30 DIAGNOSIS — M79672 Pain in left foot: Secondary | ICD-10-CM | POA: Diagnosis not present

## 2023-11-30 DIAGNOSIS — M79671 Pain in right foot: Secondary | ICD-10-CM | POA: Diagnosis not present

## 2023-12-03 DIAGNOSIS — E1169 Type 2 diabetes mellitus with other specified complication: Secondary | ICD-10-CM | POA: Diagnosis not present

## 2023-12-08 ENCOUNTER — Other Ambulatory Visit: Payer: Self-pay

## 2023-12-09 ENCOUNTER — Other Ambulatory Visit

## 2023-12-09 ENCOUNTER — Other Ambulatory Visit: Payer: Self-pay

## 2023-12-09 DIAGNOSIS — C61 Malignant neoplasm of prostate: Secondary | ICD-10-CM | POA: Diagnosis not present

## 2023-12-10 ENCOUNTER — Other Ambulatory Visit: Payer: Self-pay

## 2023-12-10 ENCOUNTER — Other Ambulatory Visit

## 2023-12-10 LAB — PSA, TOTAL AND FREE
PSA, Free Pct: 21.3 %
PSA, Free: 2.53 ng/mL
Prostate Specific Ag, Serum: 11.9 ng/mL — ABNORMAL HIGH (ref 0.0–4.0)

## 2023-12-13 ENCOUNTER — Other Ambulatory Visit: Payer: Self-pay

## 2023-12-14 ENCOUNTER — Other Ambulatory Visit: Payer: Self-pay

## 2023-12-15 ENCOUNTER — Other Ambulatory Visit (HOSPITAL_BASED_OUTPATIENT_CLINIC_OR_DEPARTMENT_OTHER): Payer: Self-pay

## 2023-12-15 ENCOUNTER — Other Ambulatory Visit: Payer: Self-pay

## 2023-12-15 ENCOUNTER — Ambulatory Visit: Admitting: Urology

## 2023-12-15 VITALS — BP 147/65 | HR 92

## 2023-12-15 DIAGNOSIS — R339 Retention of urine, unspecified: Secondary | ICD-10-CM

## 2023-12-15 DIAGNOSIS — N138 Other obstructive and reflux uropathy: Secondary | ICD-10-CM

## 2023-12-15 DIAGNOSIS — C61 Malignant neoplasm of prostate: Secondary | ICD-10-CM | POA: Diagnosis not present

## 2023-12-15 DIAGNOSIS — N401 Enlarged prostate with lower urinary tract symptoms: Secondary | ICD-10-CM | POA: Diagnosis not present

## 2023-12-15 DIAGNOSIS — R338 Other retention of urine: Secondary | ICD-10-CM

## 2023-12-15 MED ORDER — DOXYCYCLINE HYCLATE 100 MG PO CAPS
100.0000 mg | ORAL_CAPSULE | Freq: Two times a day (BID) | ORAL | 0 refills | Status: DC
  Filled 2023-12-15: qty 56, 28d supply, fill #0

## 2023-12-15 MED ORDER — DOXYCYCLINE HYCLATE 100 MG PO CAPS: 100.0000 mg | ORAL_CAPSULE | Freq: Two times a day (BID) | ORAL | 0 refills | Status: AC

## 2023-12-15 NOTE — Progress Notes (Signed)
 12/15/2023 1:56 PM   Grant [name]. 1945/08/22 [id number]  Referring provider: Shona Norleen PEDLAR, MD [address] [name],  [place] [address]  Followup BPH   HPI: Grant [name] is a 78yo here for followup for prostate cancer and BPH. Three months ago he was undergoing surgery and had a foley placed at that time. Since the foley was removed he has been having decreased stream, dysuria, urinary frequency every 1 hour. Uirne stream is fair. PSA increased to 11.9 from 4.0. He remains on finasteride  and rapaflo  8mg  BID.    PMH: Past Medical History:  Diagnosis Date   Cancer [facility])    prostate   Cervical stenosis of spine    Claudication    Claudication    Diabetes mellitus without complication (HCC)    GERD (gastroesophageal reflux disease)    Glaucoma (increased eye pressure)    Hyperlipidemia    Hypertension    Wears glasses     Surgical History: Past Surgical History:  Procedure Laterality Date   fracture back     HERNIA REPAIR     IR ANGIOGRAM PELVIS SELECTIVE OR SUPRASELECTIVE  05/31/2023   IR RADIOLOGIST EVAL & MGMT  04/29/2023   POSTERIOR CERVICAL FUSION/FORAMINOTOMY N/A 09/10/2017   Procedure: LAMINECTOMY CERVICAL FOUR - CERVICAL FIVE, CERVICAL FIVE - CERVICAL SIX, CERVICAL SIX - CERVICAL SEVEN, POSTERIOR SEGMENTAL INSTRUMENTATION, POSTERIOR LATERAL ARTHRODESIS;  Surgeon: Grant Pupa, MD;  Location: [facility] OR;  Service: Neurosurgery;  Laterality: N/A;  LAMINECTOMY CERVICAL FOUR - CERVICAL FIVE, CERVICAL FIVE - CERVICAL SIX, CERVICAL SIX - CERVICAL SEVEN, POSTERIOR SEGMENTAL    Home Medications:  Allergies as of 12/15/2023       Reactions   Morphine Other (See Comments)   Confusion, aggression, hallucinations    Augmentin [amoxicillin-pot Clavulanate] Nausea And Vomiting   Hydrocodone  Other (See Comments)    I get goofy and crazy thoughts.    Oxycontin  [oxycodone  Hcl] Other (See Comments)    I couldn't stay still; I was walking around in circles   Pt has tolerated Percocet this medication 08/2017 admssion        Medication List        Accurate as of December 15, 2023  1:56 PM. If you have any questions, ask your nurse or doctor.          Acidophilus Caps capsule Take 1 capsule by mouth daily.   Acidophilus High-Potency Caps Take 1 capsule by mouth at bedtime.   amLODipine  5 MG tablet Commonly known as: NORVASC  Take 1 tablet (5 mg total) by mouth daily. What changed: when to take this   amLODipine  5 MG tablet Commonly known as: NORVASC  Take 1 tablet (5 mg total) by mouth in the morning and at bedtime. What changed: Another medication with the same name was changed. Make sure you understand how and when to take each.   amLODipine  5 MG tablet Commonly known as: NORVASC  Take 1 tablet (5 mg total) by mouth 2 (two) times daily. What changed: Another medication with the same name was changed. Make sure you understand how and when to take each.   aspirin  EC 325 MG tablet Take 650 mg by mouth every 6 (six) hours as needed (headaches).   cilostazol  50 MG tablet Commonly known as: PLETAL  TAKE 1 TABLET BY MOUTH TWICE A DAY   cilostazol  50 MG tablet Commonly known as: PLETAL  Take 1 tablet (50 mg total) by mouth 2 (two) times daily with a meal.   esomeprazole 20  MG capsule Commonly known as: NEXIUM Take 20 mg by mouth daily as needed (acid reflux).   finasteride  5 MG tablet Commonly known as: PROSCAR  Take 1 tablet (5 mg total) by mouth daily.   latanoprost  0.005 % ophthalmic solution Commonly known as: XALATAN  Place 1 drop into both eyes at bedtime.   latanoprost  0.005 % ophthalmic solution Commonly known as: XALATAN  Place 1 drop into both eyes every evening.   losartan  100 MG tablet Commonly known as: COZAAR  Take 100 mg by mouth daily.   losartan  100 MG tablet Commonly known as: COZAAR  Take 1 tablet (100 mg total) by mouth daily.   metFORMIN  500 MG tablet Commonly known as: GLUCOPHAGE  Take 500 mg  by mouth at bedtime.   metFORMIN  500 MG tablet Commonly known as: GLUCOPHAGE  Take one tablet by mouth once daily with a meal   multivitamin with minerals tablet Take 1 tablet by mouth daily.   CertaVite/Antioxidants Tabs Take 1 tablet by mouth daily.   OneTouch Ultra test strip Generic drug: glucose blood 1 each daily.   glucose blood test strip Use to check blood sugars every morning before breakfast   pantoprazole  40 MG tablet Commonly known as: PROTONIX  Take 40 mg by mouth at bedtime.   pantoprazole  40 MG tablet Commonly known as: PROTONIX  Take 1 tablet (40 mg total) by mouth daily.   pantoprazole  40 MG tablet Commonly known as: PROTONIX  Take 1 tablet (40 mg total) by mouth daily.   rosuvastatin  20 MG tablet Commonly known as: CRESTOR  Take 1 tablet (20 mg total) by mouth daily.   silodosin  8 MG Caps capsule Commonly known as: RAPAFLO  Take 1 capsule (8 mg total) by mouth 2 (two) times daily.   silodosin  8 MG Caps capsule Commonly known as: RAPAFLO  Take 1 capsule (8 mg total) by mouth in the morning and at bedtime.   tacrolimus  0.1 % ointment Commonly known as: PROTOPIC  Apply to itchy areas on entire body twice daily.   tacrolimus  0.1 % ointment Commonly known as: PROTOPIC  Apply 1 Application topically 2 (two) times daily to itchy areas   traMADol  50 MG tablet Commonly known as: Ultram  Take 1 tablet (50 mg total) by mouth every 6 (six) hours as needed.   Tresiba  FlexTouch 100 UNIT/ML FlexTouch Pen Generic drug: insulin  degludec Inject 46 Units into the skin daily.   Tresiba  FlexTouch 100 UNIT/ML FlexTouch Pen Generic drug: insulin  degludec Inject 40 Units into the skin daily.        Allergies:  Allergies  Allergen Reactions   Morphine Other (See Comments)    Confusion, aggression, hallucinations    Augmentin [Amoxicillin-Pot Clavulanate] Nausea And Vomiting   Hydrocodone  Other (See Comments)     I get goofy and crazy thoughts.    Oxycontin   [Oxycodone  Hcl] Other (See Comments)     I couldn't stay still; I was walking around in circles  Pt has tolerated Percocet this medication 08/2017 admssion    Family History: Family History  Problem Relation Age of Onset   Diabetes Mother    Lupus Mother    Prostate cancer Father     Social History:  reports that he quit smoking about 9 years ago. His smoking use included cigarettes. He has never used smokeless tobacco. He reports current alcohol use. He reports that he does not use drugs.  ROS: All other review of systems were reviewed and are negative except what is noted above in HPI  Physical Exam: BP (!) 147/65   Pulse 92  Constitutional:  Alert and oriented, No acute distress. HEENT: [place] AT, moist mucus membranes.  Trachea midline, no masses. Cardiovascular: No clubbing, cyanosis, or edema. Respiratory: Normal respiratory effort, no increased work of breathing. GI: Abdomen is soft, nontender, nondistended, no abdominal masses GU: No CVA tenderness.  Lymph: No cervical or inguinal lymphadenopathy. Skin: No rashes, bruises or suspicious lesions. Neurologic: Grossly intact, no focal deficits, moving all 4 extremities. Psychiatric: Normal mood and affect.  Laboratory Data: Lab Results  Component Value Date   WBC 9.3 05/31/2023   HGB 12.6 (L) 05/31/2023   HCT 38.5 (L) 05/31/2023   MCV 86.1 05/31/2023   PLT 184 05/31/2023    Lab Results  Component Value Date   CREATININE 1.23 05/31/2023    No results found for: PSA  No results found for: TESTOSTERONE  Lab Results  Component Value Date   HGBA1C 7.5 (H) 04/01/2023    Urinalysis    Component Value Date/Time   APPEARANCEUR Clear 02/09/2022 0842   GLUCOSEU Negative 02/09/2022 0842   BILIRUBINUR Negative 02/09/2022 0842   PROTEINUR Negative 02/09/2022 0842   NITRITE Negative 02/09/2022 0842   LEUKOCYTESUR Negative 02/09/2022 0842    Lab Results  Component Value Date   LABMICR Comment 02/09/2022    WBCUA >30 (A) 07/28/2021   LABEPIT 0-10 07/28/2021   MUCUS Present 07/28/2021   BACTERIA Few 07/28/2021    Pertinent Imaging:  No results found for this or any previous visit.  No results found for this or any previous visit.  No results found for this or any previous visit.  No results found for this or any previous visit.  No results found for this or any previous visit.  No results found for this or any previous visit.  No results found for this or any previous visit.  No results found for this or any previous visit.   Assessment & Plan:    1. Malignant neoplasm of prostate (HCC) (Primary) Followup 3 months with PSA - Urinalysis, Routine w reflex microscopic  2. Benign prostatic hyperplasia with urinary obstruction Continue rapalfo 8mg  BID and finasteride  5mg  daily -doxyccyline 100mg  BID for 28 days  3. Incomplete emptying of bladder Continue rapaflo  8mg  BID   No follow-ups on file.  Belvie Clara, MD  [facility] [place]

## 2023-12-17 ENCOUNTER — Other Ambulatory Visit (HOSPITAL_COMMUNITY): Payer: Self-pay

## 2023-12-17 ENCOUNTER — Encounter (HOSPITAL_COMMUNITY): Payer: Self-pay

## 2023-12-21 ENCOUNTER — Encounter: Payer: Self-pay | Admitting: Urology

## 2023-12-21 MED ORDER — FINASTERIDE 5 MG PO TABS: 5.0000 mg | ORAL_TABLET | Freq: Every day | ORAL | 3 refills | Status: AC

## 2023-12-21 MED ORDER — SILODOSIN 8 MG PO CAPS: 8.0000 mg | ORAL_CAPSULE | Freq: Two times a day (BID) | ORAL | 11 refills | Status: AC

## 2023-12-21 NOTE — Patient Instructions (Signed)

## 2023-12-24 ENCOUNTER — Other Ambulatory Visit: Payer: Self-pay

## 2023-12-24 ENCOUNTER — Telehealth: Payer: Self-pay

## 2023-12-24 DIAGNOSIS — R310 Gross hematuria: Secondary | ICD-10-CM

## 2023-12-24 NOTE — Telephone Encounter (Signed)
 Dysuria  Patient called with c/o dysuria x 24 hours days.  Pain: dull  Severity:4/10  Associated Signs and Symptoms:  Fever: yesTemp.0 Chills: no Hematuria: yes Urgency: no Frequency: no Hesitancy:no Incontinence: yes Nausea: no Vomiting: no  Urologic History:  Any Recent Urologic Surgeries or Procedures:no Recurrent UTI's:no Cystitis: no  Prostatitis:yes Kidney or Bladder Stones: no Plan: Walk-in Clinic: no Appointment w/Physician: [no Lab visit scheduled for urine drop off: Yes Advice given:  Do you take on daily medications for UTI suppression No

## 2023-12-27 ENCOUNTER — Other Ambulatory Visit

## 2023-12-30 ENCOUNTER — Other Ambulatory Visit

## 2023-12-30 DIAGNOSIS — R310 Gross hematuria: Secondary | ICD-10-CM | POA: Diagnosis not present

## 2023-12-30 DIAGNOSIS — E1169 Type 2 diabetes mellitus with other specified complication: Secondary | ICD-10-CM | POA: Diagnosis not present

## 2023-12-30 DIAGNOSIS — I10 Essential (primary) hypertension: Secondary | ICD-10-CM | POA: Diagnosis not present

## 2023-12-30 LAB — URINALYSIS, ROUTINE W REFLEX MICROSCOPIC
Bilirubin, UA: NEGATIVE
Glucose, UA: NEGATIVE
Ketones, UA: NEGATIVE
Leukocytes,UA: NEGATIVE
Nitrite, UA: NEGATIVE
Protein,UA: NEGATIVE
RBC, UA: NEGATIVE
Specific Gravity, UA: 1.02 (ref 1.005–1.030)
Urobilinogen, Ur: 0.2 mg/dL (ref 0.2–1.0)
pH, UA: 6 (ref 5.0–7.5)

## 2024-01-01 LAB — URINE CULTURE

## 2024-01-02 DIAGNOSIS — E1169 Type 2 diabetes mellitus with other specified complication: Secondary | ICD-10-CM | POA: Diagnosis not present

## 2024-01-03 ENCOUNTER — Ambulatory Visit: Payer: Self-pay

## 2024-01-04 ENCOUNTER — Other Ambulatory Visit (HOSPITAL_COMMUNITY): Payer: Self-pay

## 2024-01-04 ENCOUNTER — Other Ambulatory Visit: Payer: Self-pay

## 2024-01-04 MED ORDER — CILOSTAZOL 50 MG PO TABS
50.0000 mg | ORAL_TABLET | Freq: Two times a day (BID) | ORAL | 5 refills | Status: AC
  Filled 2024-01-04: qty 60, 30d supply, fill #0
  Filled 2024-02-06: qty 60, 30d supply, fill #1

## 2024-01-04 MED ORDER — LOSARTAN POTASSIUM 100 MG PO TABS
100.0000 mg | ORAL_TABLET | Freq: Every day | ORAL | 5 refills | Status: AC
  Filled 2024-01-04: qty 30, 30d supply, fill #0
  Filled 2024-02-06: qty 30, 30d supply, fill #1

## 2024-01-04 MED ORDER — METFORMIN HCL 500 MG PO TABS
500.0000 mg | ORAL_TABLET | Freq: Every day | ORAL | 5 refills | Status: AC
  Filled 2024-01-04: qty 30, 30d supply, fill #0
  Filled 2024-02-06: qty 30, 30d supply, fill #1

## 2024-01-05 DIAGNOSIS — E782 Mixed hyperlipidemia: Secondary | ICD-10-CM | POA: Diagnosis not present

## 2024-01-05 DIAGNOSIS — N401 Enlarged prostate with lower urinary tract symptoms: Secondary | ICD-10-CM | POA: Diagnosis not present

## 2024-01-05 DIAGNOSIS — K219 Gastro-esophageal reflux disease without esophagitis: Secondary | ICD-10-CM | POA: Diagnosis not present

## 2024-01-05 DIAGNOSIS — Z0000 Encounter for general adult medical examination without abnormal findings: Secondary | ICD-10-CM | POA: Diagnosis not present

## 2024-01-05 DIAGNOSIS — E1151 Type 2 diabetes mellitus with diabetic peripheral angiopathy without gangrene: Secondary | ICD-10-CM | POA: Diagnosis not present

## 2024-01-05 DIAGNOSIS — M4802 Spinal stenosis, cervical region: Secondary | ICD-10-CM | POA: Diagnosis not present

## 2024-01-05 DIAGNOSIS — D638 Anemia in other chronic diseases classified elsewhere: Secondary | ICD-10-CM | POA: Diagnosis not present

## 2024-01-05 DIAGNOSIS — Z8546 Personal history of malignant neoplasm of prostate: Secondary | ICD-10-CM | POA: Diagnosis not present

## 2024-01-05 DIAGNOSIS — E1169 Type 2 diabetes mellitus with other specified complication: Secondary | ICD-10-CM | POA: Diagnosis not present

## 2024-01-05 DIAGNOSIS — H409 Unspecified glaucoma: Secondary | ICD-10-CM | POA: Diagnosis not present

## 2024-01-05 DIAGNOSIS — I10 Essential (primary) hypertension: Secondary | ICD-10-CM | POA: Diagnosis not present

## 2024-01-05 DIAGNOSIS — R809 Proteinuria, unspecified: Secondary | ICD-10-CM | POA: Diagnosis not present

## 2024-01-10 ENCOUNTER — Other Ambulatory Visit: Payer: Self-pay

## 2024-01-10 ENCOUNTER — Other Ambulatory Visit (HOSPITAL_COMMUNITY): Payer: Self-pay

## 2024-01-10 MED ORDER — ROSUVASTATIN CALCIUM 20 MG PO TABS
20.0000 mg | ORAL_TABLET | Freq: Every morning | ORAL | 12 refills | Status: AC
  Filled 2024-01-10: qty 30, 30d supply, fill #0
  Filled 2024-02-06: qty 30, 30d supply, fill #1

## 2024-01-11 ENCOUNTER — Other Ambulatory Visit: Payer: Self-pay

## 2024-01-14 ENCOUNTER — Other Ambulatory Visit: Payer: Self-pay

## 2024-02-06 ENCOUNTER — Other Ambulatory Visit: Payer: Self-pay

## 2024-02-07 ENCOUNTER — Other Ambulatory Visit (HOSPITAL_COMMUNITY): Payer: Self-pay

## 2024-02-07 ENCOUNTER — Other Ambulatory Visit: Payer: Self-pay

## 2024-02-08 ENCOUNTER — Other Ambulatory Visit: Payer: Self-pay

## 2024-02-08 ENCOUNTER — Other Ambulatory Visit (HOSPITAL_COMMUNITY): Payer: Self-pay

## 2024-02-13 ENCOUNTER — Other Ambulatory Visit (HOSPITAL_COMMUNITY): Payer: Self-pay

## 2024-02-13 MED ORDER — LANTUS SOLOSTAR 100 UNIT/ML ~~LOC~~ SOPN
40.0000 [IU] | PEN_INJECTOR | Freq: Every day | SUBCUTANEOUS | 2 refills | Status: AC
  Filled 2024-02-13: qty 15, 30d supply, fill #0

## 2024-02-13 MED ORDER — TRESIBA FLEXTOUCH 100 UNIT/ML ~~LOC~~ SOPN
40.0000 [IU] | PEN_INJECTOR | Freq: Every day | SUBCUTANEOUS | 5 refills | Status: AC
  Filled 2024-02-13: qty 12, 30d supply, fill #0

## 2024-02-14 ENCOUNTER — Other Ambulatory Visit: Payer: Self-pay

## 2024-03-21 ENCOUNTER — Other Ambulatory Visit

## 2024-06-19 ENCOUNTER — Other Ambulatory Visit

## 2024-06-28 ENCOUNTER — Ambulatory Visit: Admitting: Urology
